# Patient Record
Sex: Male | Born: 1960 | Race: White | Hispanic: No | Marital: Married | Smoking: Former smoker
Health system: Southern US, Community
[De-identification: ages and names within clinical notes are randomized; demographics above are authoritative.]

## PROBLEM LIST (undated history)

## (undated) DIAGNOSIS — F32A Depression, unspecified: Secondary | ICD-10-CM

## (undated) DIAGNOSIS — Z789 Other specified health status: Secondary | ICD-10-CM

## (undated) DIAGNOSIS — C61 Malignant neoplasm of prostate: Secondary | ICD-10-CM

## (undated) DIAGNOSIS — R972 Elevated prostate specific antigen [PSA]: Secondary | ICD-10-CM

## (undated) DIAGNOSIS — S32409A Unspecified fracture of unspecified acetabulum, initial encounter for closed fracture: Secondary | ICD-10-CM

## (undated) DIAGNOSIS — I4891 Unspecified atrial fibrillation: Secondary | ICD-10-CM

## (undated) DIAGNOSIS — I499 Cardiac arrhythmia, unspecified: Secondary | ICD-10-CM

## (undated) HISTORY — DX: Unspecified fracture of unspecified acetabulum, initial encounter for closed fracture: S32.409A

## (undated) HISTORY — PX: OTHER SURGICAL HISTORY: SHX169

## (undated) HISTORY — PX: KNEE SURGERY: SHX244

---

## 1993-01-27 DIAGNOSIS — J189 Pneumonia, unspecified organism: Secondary | ICD-10-CM

## 1993-01-27 HISTORY — DX: Pneumonia, unspecified organism: J18.9

## 2008-06-12 ENCOUNTER — Encounter: Admission: RE | Admit: 2008-06-12 | Discharge: 2008-06-12 | Payer: Self-pay | Admitting: Family Medicine

## 2010-10-28 DEATH — deceased

## 2011-01-28 DIAGNOSIS — I4891 Unspecified atrial fibrillation: Secondary | ICD-10-CM

## 2011-01-28 HISTORY — DX: Unspecified atrial fibrillation: I48.91

## 2011-05-05 ENCOUNTER — Emergency Department (HOSPITAL_COMMUNITY): Payer: No Typology Code available for payment source

## 2011-05-05 ENCOUNTER — Inpatient Hospital Stay (HOSPITAL_COMMUNITY)
Admission: EM | Admit: 2011-05-05 | Discharge: 2011-05-20 | DRG: 958 | Disposition: A | Discharge status: Rehabilitation Facility | Payer: No Typology Code available for payment source | Attending: Orthopedic Surgery | Admitting: Orthopedic Surgery

## 2011-05-05 ENCOUNTER — Encounter (HOSPITAL_COMMUNITY)
Admission: EM | Disposition: A | Discharge status: Rehabilitation Facility | Payer: Self-pay | Source: Home / Self Care | Attending: Orthopedic Surgery

## 2011-05-05 ENCOUNTER — Encounter (HOSPITAL_COMMUNITY): Payer: Self-pay | Admitting: *Deleted

## 2011-05-05 ENCOUNTER — Encounter (HOSPITAL_COMMUNITY): Payer: Self-pay | Admitting: Anesthesiology

## 2011-05-05 DIAGNOSIS — N5089 Other specified disorders of the male genital organs: Secondary | ICD-10-CM | POA: Diagnosis not present

## 2011-05-05 DIAGNOSIS — S32409A Unspecified fracture of unspecified acetabulum, initial encounter for closed fracture: Principal | ICD-10-CM | POA: Diagnosis present

## 2011-05-05 DIAGNOSIS — J9819 Other pulmonary collapse: Secondary | ICD-10-CM | POA: Diagnosis present

## 2011-05-05 DIAGNOSIS — J9811 Atelectasis: Secondary | ICD-10-CM

## 2011-05-05 DIAGNOSIS — I959 Hypotension, unspecified: Secondary | ICD-10-CM | POA: Diagnosis present

## 2011-05-05 DIAGNOSIS — S32810A Multiple fractures of pelvis with stable disruption of pelvic ring, initial encounter for closed fracture: Secondary | ICD-10-CM | POA: Diagnosis present

## 2011-05-05 DIAGNOSIS — F17200 Nicotine dependence, unspecified, uncomplicated: Secondary | ICD-10-CM | POA: Diagnosis present

## 2011-05-05 DIAGNOSIS — F1010 Alcohol abuse, uncomplicated: Secondary | ICD-10-CM | POA: Diagnosis present

## 2011-05-05 DIAGNOSIS — S82409B Unspecified fracture of shaft of unspecified fibula, initial encounter for open fracture type I or II: Secondary | ICD-10-CM

## 2011-05-05 DIAGNOSIS — G5730 Lesion of lateral popliteal nerve, unspecified lower limb: Secondary | ICD-10-CM | POA: Diagnosis present

## 2011-05-05 DIAGNOSIS — E876 Hypokalemia: Secondary | ICD-10-CM | POA: Diagnosis not present

## 2011-05-05 DIAGNOSIS — D62 Acute posthemorrhagic anemia: Secondary | ICD-10-CM

## 2011-05-05 DIAGNOSIS — S32402A Unspecified fracture of left acetabulum, initial encounter for closed fracture: Secondary | ICD-10-CM

## 2011-05-05 DIAGNOSIS — IMO0002 Reserved for concepts with insufficient information to code with codable children: Secondary | ICD-10-CM | POA: Diagnosis present

## 2011-05-05 DIAGNOSIS — S329XXA Fracture of unspecified parts of lumbosacral spine and pelvis, initial encounter for closed fracture: Secondary | ICD-10-CM

## 2011-05-05 DIAGNOSIS — I4891 Unspecified atrial fibrillation: Secondary | ICD-10-CM | POA: Diagnosis not present

## 2011-05-05 DIAGNOSIS — S82202B Unspecified fracture of shaft of left tibia, initial encounter for open fracture type I or II: Secondary | ICD-10-CM

## 2011-05-05 DIAGNOSIS — S82209B Unspecified fracture of shaft of unspecified tibia, initial encounter for open fracture type I or II: Secondary | ICD-10-CM

## 2011-05-05 DIAGNOSIS — R5381 Other malaise: Secondary | ICD-10-CM | POA: Diagnosis present

## 2011-05-05 HISTORY — DX: Other specified health status: Z78.9

## 2011-05-05 HISTORY — PX: ORIF TIBIA FRACTURE: SHX5416

## 2011-05-05 HISTORY — DX: Unspecified fracture of unspecified acetabulum, initial encounter for closed fracture: S32.409A

## 2011-05-05 LAB — COMPREHENSIVE METABOLIC PANEL
ALT: 67 U/L — ABNORMAL HIGH (ref 0–53)
AST: 59 U/L — ABNORMAL HIGH (ref 0–37)
Calcium: 8.2 mg/dL — ABNORMAL LOW (ref 8.4–10.5)
Creatinine, Ser: 1.12 mg/dL (ref 0.50–1.35)
GFR calc Af Amer: 87 mL/min — ABNORMAL LOW (ref >90)
Sodium: 139 mEq/L (ref 135–145)
Total Protein: 6.5 g/dL (ref 6.0–8.3)

## 2011-05-05 LAB — POCT I-STAT, CHEM 8
BUN: 8 mg/dL (ref 6–23)
Chloride: 107 mEq/L (ref 96–112)
Creatinine, Ser: 1.4 mg/dL — ABNORMAL HIGH (ref 0.50–1.35)
Glucose, Bld: 146 mg/dL — ABNORMAL HIGH (ref 70–99)
Potassium: 3.7 mEq/L (ref 3.5–5.1)
Sodium: 143 mEq/L (ref 135–145)

## 2011-05-05 LAB — URINALYSIS, MICROSCOPIC ONLY
Glucose, UA: NEGATIVE mg/dL
Leukocytes, UA: NEGATIVE
Protein, ur: 100 mg/dL — AB
pH: 6.5 (ref 5.0–8.0)

## 2011-05-05 LAB — CBC
HCT: 42.5 % (ref 39.0–52.0)
Hemoglobin: 14.8 g/dL (ref 13.0–17.0)
MCH: 31.9 pg (ref 26.0–34.0)
MCHC: 34.8 g/dL (ref 30.0–36.0)
RBC: 4.64 MIL/uL (ref 4.22–5.81)

## 2011-05-05 LAB — PROTIME-INR: INR: 1.18 (ref 0.00–1.49)

## 2011-05-05 LAB — CDS SEROLOGY

## 2011-05-05 SURGERY — OPEN REDUCTION INTERNAL FIXATION (ORIF) TIBIA FRACTURE
Anesthesia: General | Site: Leg Lower | Wound class: Dirty or Infected

## 2011-05-05 MED ORDER — TETANUS-DIPHTH-ACELL PERTUSSIS 5-2.5-18.5 LF-MCG/0.5 IM SUSP
0.5000 mL | Freq: Once | INTRAMUSCULAR | Status: AC
  Administered 2011-05-05: 0.5 mL via INTRAMUSCULAR
  Filled 2011-05-05: qty 0.5

## 2011-05-05 MED ORDER — CEFAZOLIN SODIUM 1-5 GM-% IV SOLN: 1.0000 g | Freq: Three times a day (TID) | INTRAVENOUS | Status: AC

## 2011-05-05 MED ORDER — SODIUM CHLORIDE 0.9 % IV BOLUS (SEPSIS)
1000.0000 mL | Freq: Once | INTRAVENOUS | Status: AC
  Administered 2011-05-05: 1000 mL via INTRAVENOUS

## 2011-05-05 MED ORDER — CEFAZOLIN SODIUM 1-5 GM-% IV SOLN
1.0000 g | Freq: Once | INTRAVENOUS | Status: AC
  Administered 2011-05-05 – 2011-05-06 (×2): 1 g via INTRAVENOUS
  Filled 2011-05-05: qty 50

## 2011-05-05 MED ORDER — IOHEXOL 300 MG/ML  SOLN
100.0000 mL | Freq: Once | INTRAMUSCULAR | Status: AC | PRN
  Administered 2011-05-05: 100 mL via INTRAVENOUS

## 2011-05-05 MED ORDER — HYDROMORPHONE HCL PF 1 MG/ML IJ SOLN
INTRAMUSCULAR | Status: AC
  Filled 2011-05-05: qty 1

## 2011-05-05 MED ORDER — LACTATED RINGERS IV SOLN
INTRAVENOUS | Status: DC | PRN
  Administered 2011-05-05 – 2011-05-06 (×2): via INTRAVENOUS

## 2011-05-05 MED ORDER — ONDANSETRON HCL 4 MG/2ML IJ SOLN
INTRAMUSCULAR | Status: AC
  Filled 2011-05-05: qty 2

## 2011-05-05 MED ORDER — HYDROMORPHONE HCL PF 1 MG/ML IJ SOLN
1.0000 mg | Freq: Once | INTRAMUSCULAR | Status: AC
  Administered 2011-05-05: 1 mg via INTRAVENOUS
  Filled 2011-05-05: qty 1

## 2011-05-05 MED ORDER — MORPHINE SULFATE 4 MG/ML IJ SOLN
4.0000 mg | INTRAMUSCULAR | Status: DC | PRN
  Administered 2011-05-06 (×4): 4 mg via INTRAVENOUS
  Filled 2011-05-05 (×4): qty 1

## 2011-05-05 MED ORDER — MORPHINE SULFATE 4 MG/ML IJ SOLN
INTRAMUSCULAR | Status: AC
  Administered 2011-05-05: 4 mg via INTRAVENOUS
  Filled 2011-05-05: qty 1

## 2011-05-05 MED ORDER — HYDROMORPHONE HCL PF 1 MG/ML IJ SOLN
1.0000 mg | Freq: Once | INTRAMUSCULAR | Status: AC
  Administered 2011-05-05: 1 mg via INTRAVENOUS

## 2011-05-05 SURGICAL SUPPLY — 59 items
BANDAGE ELASTIC 6 VELCRO ST LF (GAUZE/BANDAGES/DRESSINGS) ×3 IMPLANT
BIT DRILL 2.5X2.75 QC CALB (BIT) ×2 IMPLANT
CLEANER TIP ELECTROSURG 2X2 (MISCELLANEOUS) ×3 IMPLANT
CLOTH BEACON ORANGE TIMEOUT ST (SAFETY) ×3 IMPLANT
COTTON STERILE ROLL (GAUZE/BANDAGES/DRESSINGS) ×3 IMPLANT
CUFF TOURNIQUET SINGLE 34IN LL (TOURNIQUET CUFF) IMPLANT
CUFF TOURNIQUET SINGLE 44IN (TOURNIQUET CUFF) IMPLANT
DRAPE C-ARM 42X72 X-RAY (DRAPES) ×3 IMPLANT
DRAPE INCISE IOBAN 66X45 STRL (DRAPES) ×1 IMPLANT
DRAPE PROXIMA HALF (DRAPES) ×7 IMPLANT
DRAPE U-SHAPE 47X51 STRL (DRAPES) ×1 IMPLANT
DRSG ADAPTIC 3X8 NADH LF (GAUZE/BANDAGES/DRESSINGS) ×3 IMPLANT
DRSG PAD ABDOMINAL 8X10 ST (GAUZE/BANDAGES/DRESSINGS) ×3 IMPLANT
ELECT REM PT RETURN 9FT ADLT (ELECTROSURGICAL) ×3
ELECTRODE REM PT RTRN 9FT ADLT (ELECTROSURGICAL) ×2 IMPLANT
GLOVE BIO SURGEON STRL SZ7.5 (GLOVE) ×3 IMPLANT
GLOVE BIOGEL PI IND STRL 7.5 (GLOVE) ×2 IMPLANT
GLOVE BIOGEL PI INDICATOR 7.5 (GLOVE) ×1
GLOVE SKINSENSE NS SZ8.0 LF (GLOVE) ×1
GLOVE SKINSENSE STRL SZ8.0 LF (GLOVE) ×2 IMPLANT
GLOVE SURG SS PI 8.0 STRL IVOR (GLOVE) ×3 IMPLANT
GOWN STRL NON-REIN LRG LVL3 (GOWN DISPOSABLE) ×9 IMPLANT
GUIDEWIRE BALL NOSE 80CM (WIRE) ×2 IMPLANT
KIT BASIN OR (CUSTOM PROCEDURE TRAY) ×3 IMPLANT
KIT ROOM TURNOVER OR (KITS) ×3 IMPLANT
MANIFOLD NEPTUNE II (INSTRUMENTS) ×3 IMPLANT
NS IRRIG 1000ML POUR BTL (IV SOLUTION) ×3 IMPLANT
PACK ORTHO EXTREMITY (CUSTOM PROCEDURE TRAY) ×3 IMPLANT
PAD ARMBOARD 7.5X6 YLW CONV (MISCELLANEOUS) ×6 IMPLANT
PAD CAST 4YDX4 CTTN HI CHSV (CAST SUPPLIES) ×2 IMPLANT
PADDING CAST COTTON 4X4 STRL (CAST SUPPLIES) ×3
PADDING CAST COTTON 6X4 STRL (CAST SUPPLIES) ×2 IMPLANT
PLATE LOCK COMP 9H 3.5 FOOT (Plate) ×2 IMPLANT
SCREW CORT FT 32X3.5XNONLOCK (Screw) ×3 IMPLANT
SCREW CORTICAL 3.5MM  28MM (Screw) ×1 IMPLANT
SCREW CORTICAL 3.5MM  30MM (Screw) ×2 IMPLANT
SCREW CORTICAL 3.5MM  32MM (Screw) ×3 IMPLANT
SCREW CORTICAL 3.5MM  34MM (Screw) ×1 IMPLANT
SCREW CORTICAL 3.5MM 26MM (Screw) ×2 IMPLANT
SCREW CORTICAL 3.5MM 28MM (Screw) ×1 IMPLANT
SCREW CORTICAL 3.5MM 30MM (Screw) ×2 IMPLANT
SCREW CORTICAL 3.5MM 32MM (Screw) ×6 IMPLANT
SCREW CORTICAL 3.5MM 34MM (Screw) ×1 IMPLANT
SPONGE GAUZE 4X4 12PLY (GAUZE/BANDAGES/DRESSINGS) ×3 IMPLANT
SPONGE LAP 18X18 X RAY DECT (DISPOSABLE) ×5 IMPLANT
SPONGE LAP 4X18 X RAY DECT (DISPOSABLE) ×1 IMPLANT
STAPLER VISISTAT 35W (STAPLE) ×3 IMPLANT
STOCKINETTE IMPERVIOUS 9X36 MD (GAUZE/BANDAGES/DRESSINGS) ×3 IMPLANT
SUCTION FRAZIER TIP 10 FR DISP (SUCTIONS) ×1 IMPLANT
SUT ETHILON 3 0 PS 1 (SUTURE) ×4 IMPLANT
SUT VIC AB 2-0 CTB1 (SUTURE) ×2 IMPLANT
SUT VIC AB 2-0 FS1 27 (SUTURE) ×2 IMPLANT
SUT VIC AB 3-0 FS2 27 (SUTURE) ×4 IMPLANT
TOWEL OR 17X24 6PK STRL BLUE (TOWEL DISPOSABLE) ×3 IMPLANT
TOWEL OR 17X26 10 PK STRL BLUE (TOWEL DISPOSABLE) ×3 IMPLANT
TUBE CONNECTING 12X1/4 (SUCTIONS) ×3 IMPLANT
UNDERPAD 30X30 INCONTINENT (UNDERPADS AND DIAPERS) ×3 IMPLANT
WATER STERILE IRR 1000ML POUR (IV SOLUTION) ×3 IMPLANT
YANKAUER SUCT BULB TIP NO VENT (SUCTIONS) ×3 IMPLANT

## 2011-05-05 NOTE — ED Notes (Addendum)
xrays complete
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
## 2011-05-05 NOTE — ED Notes (Addendum)
Upgrade to level one per EDP

## 2011-05-05 NOTE — ED Notes (Signed)
Pt returned to exam room. 
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
## 2011-05-05 NOTE — Progress Notes (Signed)
Orthopedic Tech Progress Note Patient Details:  [name] 09-13-1960 [id number]  Other Ortho Devices Type of Ortho Device: [place] cervical collar Ortho Device Location: neck Ortho Device Interventions: Application   Kimi Bordeau 05/05/2011, 10:01 PM

## 2011-05-05 NOTE — ED Notes (Signed)
Dr Criss Alvine at bedside. Aware of BP liter #3 and #4 up per verbal order on pressure bags

## 2011-05-05 NOTE — ED Notes (Signed)
Pt refused to have wedding band cut off at this time

## 2011-05-05 NOTE — ED Notes (Signed)
Rectal exam by EDP. Good rectal tone no gross blood

## 2011-05-05 NOTE — ED Notes (Signed)
Pt cleared from back board. 

## 2011-05-05 NOTE — ED Notes (Signed)
Pt to CT with RN

## 2011-05-05 NOTE — ED Notes (Signed)
Family updated as to patient's status.

## 2011-05-05 NOTE — ED Notes (Signed)
Pt in CT scan, RN and EMT with pt
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
## 2011-05-05 NOTE — ED Notes (Signed)
Dr Biagio Quint trauma MD at bedsie
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
## 2011-05-05 NOTE — Preoperative (Signed)
Beta Blockers   Reason not to administer Beta Blockers:Not Applicable 

## 2011-05-05 NOTE — Anesthesia Preprocedure Evaluation (Addendum)
Anesthesia Evaluation  Patient identified by MRN, date of birth, ID band Patient awake    Reviewed: Allergy & Precautions, H&P , NPO status , Patient's Chart, lab work & pertinent test results  Airway Mallampati: II TM Distance: >3 FB Neck ROM: Full    Dental   Pulmonary Current Smoker,    Pulmonary exam normal       Cardiovascular     Neuro/Psych    GI/Hepatic   Endo/Other    Renal/GU      Musculoskeletal   Abdominal (+) + obese,   Peds  Hematology   Anesthesia Other Findings   Reproductive/Obstetrics                          Anesthesia Physical Anesthesia Plan  ASA: II and Emergent  Anesthesia Plan: General   Post-op Pain Management:    Induction: Intravenous, Rapid sequence and Cricoid pressure planned  Airway Management Planned: Oral ETT  Additional Equipment:   Intra-op Plan:   Post-operative Plan: Extubation in OR  Informed Consent: I have reviewed the patients History and Physical, chart, labs and discussed the procedure including the risks, benefits and alternatives for the proposed anesthesia with the patient or authorized representative who has indicated his/her understanding and acceptance.     Plan Discussed with: CRNA and Surgeon  Anesthesia Plan Comments:         Anesthesia Quick Evaluation
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
## 2011-05-06 ENCOUNTER — Inpatient Hospital Stay (HOSPITAL_COMMUNITY): Payer: No Typology Code available for payment source

## 2011-05-06 ENCOUNTER — Encounter (HOSPITAL_COMMUNITY): Payer: Self-pay | Admitting: Anesthesiology

## 2011-05-06 ENCOUNTER — Emergency Department (HOSPITAL_COMMUNITY): Payer: No Typology Code available for payment source

## 2011-05-06 ENCOUNTER — Emergency Department (HOSPITAL_COMMUNITY): Payer: No Typology Code available for payment source | Admitting: Anesthesiology

## 2011-05-06 ENCOUNTER — Encounter (HOSPITAL_COMMUNITY): Payer: Self-pay | Admitting: *Deleted

## 2011-05-06 DIAGNOSIS — S32810A Multiple fractures of pelvis with stable disruption of pelvic ring, initial encounter for closed fracture: Secondary | ICD-10-CM | POA: Diagnosis present

## 2011-05-06 DIAGNOSIS — D62 Acute posthemorrhagic anemia: Secondary | ICD-10-CM

## 2011-05-06 DIAGNOSIS — S82209B Unspecified fracture of shaft of unspecified tibia, initial encounter for open fracture type I or II: Secondary | ICD-10-CM | POA: Diagnosis present

## 2011-05-06 LAB — POCT I-STAT 4, (NA,K, GLUC, HGB,HCT)
Potassium: 4.3 mEq/L (ref 3.5–5.1)
Sodium: 143 mEq/L (ref 135–145)

## 2011-05-06 MED ORDER — LIDOCAINE HCL (CARDIAC) 20 MG/ML IV SOLN
INTRAVENOUS | Status: DC | PRN
  Administered 2011-05-06: 100 mg via INTRAVENOUS

## 2011-05-06 MED ORDER — ONDANSETRON HCL 4 MG PO TABS: 4.0000 mg | ORAL_TABLET | Freq: Four times a day (QID) | ORAL | Status: DC | PRN

## 2011-05-06 MED ORDER — OXYCODONE-ACETAMINOPHEN 5-325 MG PO TABS: 1.0000 | ORAL_TABLET | ORAL | Status: DC | PRN

## 2011-05-06 MED ORDER — MORPHINE SULFATE (PF) 1 MG/ML IV SOLN
INTRAVENOUS | Status: AC
  Filled 2011-05-06: qty 25

## 2011-05-06 MED ORDER — ONDANSETRON HCL 4 MG/2ML IJ SOLN
INTRAMUSCULAR | Status: DC | PRN
  Administered 2011-05-06: 4 mg via INTRAVENOUS

## 2011-05-06 MED ORDER — DEXTROSE 5 % IV SOLN
500.0000 mg | Freq: Four times a day (QID) | INTRAVENOUS | Status: DC
  Administered 2011-05-08 – 2011-05-12 (×3): 500 mg via INTRAVENOUS
  Filled 2011-05-06 (×38): qty 5

## 2011-05-06 MED ORDER — HYDROMORPHONE HCL PF 1 MG/ML IJ SOLN
0.2500 mg | INTRAMUSCULAR | Status: DC | PRN
  Administered 2011-05-06 (×4): 0.5 mg via INTRAVENOUS

## 2011-05-06 MED ORDER — MORPHINE SULFATE 4 MG/ML IJ SOLN: 3.0000 mg | INTRAMUSCULAR | Status: DC | PRN

## 2011-05-06 MED ORDER — ROCURONIUM BROMIDE 100 MG/10ML IV SOLN
INTRAVENOUS | Status: DC | PRN
  Administered 2011-05-06: 30 mg via INTRAVENOUS
  Administered 2011-05-06: 20 mg via INTRAVENOUS

## 2011-05-06 MED ORDER — METOCLOPRAMIDE HCL 5 MG/ML IJ SOLN
5.0000 mg | Freq: Three times a day (TID) | INTRAMUSCULAR | Status: DC | PRN
  Filled 2011-05-06: qty 2

## 2011-05-06 MED ORDER — DIPHENHYDRAMINE HCL 50 MG/ML IJ SOLN: 12.5000 mg | Freq: Four times a day (QID) | INTRAMUSCULAR | Status: DC | PRN

## 2011-05-06 MED ORDER — SUCCINYLCHOLINE CHLORIDE 20 MG/ML IJ SOLN
INTRAMUSCULAR | Status: DC | PRN
  Administered 2011-05-06: 140 mg via INTRAVENOUS

## 2011-05-06 MED ORDER — DIPHENHYDRAMINE HCL 25 MG PO CAPS
25.0000 mg | ORAL_CAPSULE | Freq: Once | ORAL | Status: AC
  Administered 2011-05-06: 25 mg via ORAL
  Filled 2011-05-06: qty 1

## 2011-05-06 MED ORDER — ACETAMINOPHEN 10 MG/ML IV SOLN
1000.0000 mg | Freq: Four times a day (QID) | INTRAVENOUS | Status: AC
  Administered 2011-05-06 – 2011-05-07 (×4): 1000 mg via INTRAVENOUS
  Filled 2011-05-06 (×4): qty 100

## 2011-05-06 MED ORDER — GENTAMICIN SULFATE 40 MG/ML IJ SOLN
480.0000 mg | Freq: Once | INTRAVENOUS | Status: AC
  Administered 2011-05-06: 480 mg via INTRAVENOUS
  Filled 2011-05-06: qty 12

## 2011-05-06 MED ORDER — METHOCARBAMOL 500 MG PO TABS
500.0000 mg | ORAL_TABLET | Freq: Four times a day (QID) | ORAL | Status: DC | PRN
  Administered 2011-05-06 (×2): 500 mg via ORAL
  Filled 2011-05-06 (×2): qty 1

## 2011-05-06 MED ORDER — PHENYLEPHRINE HCL 10 MG/ML IJ SOLN
10.0000 mg | INTRAVENOUS | Status: DC | PRN
  Administered 2011-05-06: 5 ug/min via INTRAVENOUS

## 2011-05-06 MED ORDER — ENOXAPARIN SODIUM 30 MG/0.3ML ~~LOC~~ SOLN
30.0000 mg | Freq: Two times a day (BID) | SUBCUTANEOUS | Status: DC
  Administered 2011-05-06 – 2011-05-20 (×23): 30 mg via SUBCUTANEOUS
  Filled 2011-05-06 (×32): qty 0.3

## 2011-05-06 MED ORDER — DIPHENHYDRAMINE HCL 12.5 MG/5ML PO ELIX: 12.5000 mg | ORAL_SOLUTION | Freq: Four times a day (QID) | ORAL | Status: DC | PRN

## 2011-05-06 MED ORDER — SODIUM CHLORIDE 0.9 % IJ SOLN: 9.0000 mL | INTRAMUSCULAR | Status: DC | PRN

## 2011-05-06 MED ORDER — KCL IN DEXTROSE-NACL 20-5-0.9 MEQ/L-%-% IV SOLN
INTRAVENOUS | Status: DC
  Administered 2011-05-06: 1000 mL via INTRAVENOUS
  Administered 2011-05-08 – 2011-05-09 (×2): via INTRAVENOUS
  Filled 2011-05-06 (×12): qty 1000

## 2011-05-06 MED ORDER — FENTANYL CITRATE 0.05 MG/ML IJ SOLN
INTRAMUSCULAR | Status: DC | PRN
  Administered 2011-05-06 (×3): 50 ug via INTRAVENOUS

## 2011-05-06 MED ORDER — HYDROMORPHONE HCL PF 1 MG/ML IJ SOLN
INTRAMUSCULAR | Status: AC
  Filled 2011-05-06: qty 1

## 2011-05-06 MED ORDER — GENTAMICIN SULFATE 40 MG/ML IJ SOLN
480.0000 mg | INTRAVENOUS | Status: DC
  Filled 2011-05-06 (×2): qty 12

## 2011-05-06 MED ORDER — SODIUM CHLORIDE 0.9 % IR SOLN
Status: DC | PRN
  Administered 2011-05-06: 6000 mL

## 2011-05-06 MED ORDER — ALBUMIN HUMAN 5 % IV SOLN
INTRAVENOUS | Status: DC | PRN
  Administered 2011-05-06: 01:00:00 via INTRAVENOUS

## 2011-05-06 MED ORDER — METOCLOPRAMIDE HCL 10 MG PO TABS
5.0000 mg | ORAL_TABLET | Freq: Three times a day (TID) | ORAL | Status: DC | PRN
  Filled 2011-05-06: qty 1

## 2011-05-06 MED ORDER — LORAZEPAM 2 MG/ML IJ SOLN: 1.0000 mg | Freq: Once | INTRAMUSCULAR | Status: DC | PRN

## 2011-05-06 MED ORDER — CEFAZOLIN SODIUM 1-5 GM-% IV SOLN: 1.0000 g | Freq: Three times a day (TID) | INTRAVENOUS | Status: DC

## 2011-05-06 MED ORDER — ONDANSETRON HCL 4 MG/2ML IJ SOLN: 4.0000 mg | Freq: Four times a day (QID) | INTRAMUSCULAR | Status: DC | PRN

## 2011-05-06 MED ORDER — LACTATED RINGERS IV SOLN: INTRAVENOUS | Status: DC

## 2011-05-06 MED ORDER — VANCOMYCIN HCL 1000 MG IV SOLR
1500.0000 mg | Freq: Once | INTRAVENOUS | Status: AC
  Administered 2011-05-06: 1500 mg via INTRAVENOUS
  Filled 2011-05-06: qty 1500

## 2011-05-06 MED ORDER — FUROSEMIDE 10 MG/ML IJ SOLN
20.0000 mg | Freq: Once | INTRAMUSCULAR | Status: AC
  Administered 2011-05-06: 20 mg via INTRAVENOUS
  Filled 2011-05-06: qty 2

## 2011-05-06 MED ORDER — VANCOMYCIN HCL 1000 MG IV SOLR
1250.0000 mg | Freq: Two times a day (BID) | INTRAVENOUS | Status: AC
  Administered 2011-05-06 – 2011-05-08 (×4): 1250 mg via INTRAVENOUS
  Filled 2011-05-06 (×5): qty 1250

## 2011-05-06 MED ORDER — 0.9 % SODIUM CHLORIDE (POUR BTL) OPTIME
TOPICAL | Status: DC | PRN
  Administered 2011-05-06: 1000 mL

## 2011-05-06 MED ORDER — SODIUM CHLORIDE 0.9 % IV SOLN
INTRAVENOUS | Status: DC
  Administered 2011-05-06: 75 mL/h via INTRAVENOUS

## 2011-05-06 MED ORDER — MIDAZOLAM HCL 5 MG/5ML IJ SOLN
INTRAMUSCULAR | Status: DC | PRN
  Administered 2011-05-06: 2 mg via INTRAVENOUS

## 2011-05-06 MED ORDER — PROPOFOL 10 MG/ML IV EMUL
INTRAVENOUS | Status: DC | PRN
  Administered 2011-05-06: 200 mg via INTRAVENOUS

## 2011-05-06 MED ORDER — FUROSEMIDE 10 MG/ML IJ SOLN
20.0000 mg | Freq: Once | INTRAMUSCULAR | Status: AC
  Administered 2011-05-06: 20 mg via INTRAVENOUS

## 2011-05-06 MED ORDER — PHENYLEPHRINE HCL 10 MG/ML IJ SOLN
INTRAMUSCULAR | Status: DC | PRN
  Administered 2011-05-06 (×8): 80 ug via INTRAVENOUS

## 2011-05-06 MED ORDER — METHOCARBAMOL 100 MG/ML IJ SOLN: 500.0000 mg | Freq: Four times a day (QID) | INTRAVENOUS | Status: DC | PRN

## 2011-05-06 MED ORDER — MORPHINE SULFATE (PF) 1 MG/ML IV SOLN
INTRAVENOUS | Status: AC
  Administered 2011-05-06: 12:00:00
  Filled 2011-05-06: qty 25

## 2011-05-06 MED ORDER — NALOXONE HCL 0.4 MG/ML IJ SOLN: 0.4000 mg | INTRAMUSCULAR | Status: DC | PRN

## 2011-05-06 MED ORDER — HETASTARCH-ELECTROLYTES 6 % IV SOLN
INTRAVENOUS | Status: DC | PRN
  Administered 2011-05-06 (×2): via INTRAVENOUS

## 2011-05-06 MED ORDER — ONDANSETRON HCL 4 MG/2ML IJ SOLN
4.0000 mg | Freq: Four times a day (QID) | INTRAMUSCULAR | Status: DC | PRN
  Administered 2011-05-06: 4 mg via INTRAVENOUS
  Filled 2011-05-06: qty 2

## 2011-05-06 MED ORDER — PANTOPRAZOLE SODIUM 40 MG IV SOLR
40.0000 mg | Freq: Every day | INTRAVENOUS | Status: DC
  Administered 2011-05-06: 40 mg via INTRAVENOUS
  Filled 2011-05-06 (×2): qty 40

## 2011-05-06 MED ORDER — METHOCARBAMOL 500 MG PO TABS
500.0000 mg | ORAL_TABLET | Freq: Four times a day (QID) | ORAL | Status: DC
  Administered 2011-05-06 – 2011-05-15 (×26): 500 mg via ORAL
  Filled 2011-05-06 (×42): qty 1

## 2011-05-06 MED ORDER — MORPHINE SULFATE (PF) 1 MG/ML IV SOLN
INTRAVENOUS | Status: DC
  Administered 2011-05-06: 25 mg via INTRAVENOUS
  Administered 2011-05-06: 30 mg via INTRAVENOUS
  Administered 2011-05-07: 7.2 mg via INTRAVENOUS
  Administered 2011-05-07: 3 mg via INTRAVENOUS
  Administered 2011-05-07: 7.5 mg via INTRAVENOUS
  Administered 2011-05-07: 10.5 mg via INTRAVENOUS
  Administered 2011-05-07: 6 mg via INTRAVENOUS
  Administered 2011-05-07: 21:00:00 via INTRAVENOUS
  Administered 2011-05-07: 1.6 mg via INTRAVENOUS
  Administered 2011-05-08: 06:00:00 via INTRAVENOUS
  Administered 2011-05-08: 9 mg via INTRAVENOUS
  Administered 2011-05-08 (×2): via INTRAVENOUS
  Administered 2011-05-08: 9.95 mg via INTRAVENOUS
  Administered 2011-05-08: 27 mg via INTRAVENOUS
  Administered 2011-05-09: 7.5 mg via INTRAVENOUS
  Administered 2011-05-09: 18 mg via INTRAVENOUS
  Administered 2011-05-09: 04:00:00 via INTRAVENOUS
  Administered 2011-05-09: 12 mg via INTRAVENOUS
  Administered 2011-05-09: 17:00:00 via INTRAVENOUS
  Administered 2011-05-09: 4.5 mg via INTRAVENOUS
  Administered 2011-05-10: 25.5 mg via INTRAVENOUS

## 2011-05-06 NOTE — Transfer of Care (Signed)
Immediate Anesthesia Transfer of Care Note  Patient: [name]  Procedure(s) Performed: Procedure(s) (LRB): OPEN REDUCTION INTERNAL FIXATION (ORIF) TIBIA FRACTURE ()  Patient Location: PACU  Anesthesia Type: General  Level of Consciousness: awake and alert   Airway & Oxygen Therapy: Patient Spontanous Breathing and Patient connected to nasal cannula oxygen  Post-op Assessment: Report given to PACU RN, Post -op Vital signs reviewed and stable and Patient moving all extremities X 4  Post vital signs: Reviewed and stable  Complications: No apparent anesthesia complications
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
## 2011-05-06 NOTE — Consult Note (Signed)
[facility]  Dictation [phone]  Pt seen and evaluated  Plan for OR Thursday for ORIF L acetabulum Will arrange for XRT for HO prophylaxis hopefully Friday Continue bed rest for now Overhead frame with trapeze SCD's and Lovenox for DVT prophylaxis, will hold tomorrow pm dose and Thursday am dose. Plan for lovenox bridge to coumadin after surgery Pt will be NWB/TDWB for transfers after surgery ABL anemia- transfuse 2 units now as we anticipate surgery on Thursday. Need to physiologically optimize pt. Recheck lactic acid in am Ice prn L leg and hip Monitor for development of ileus, decreased BS at current time.  Would possibly expect this to be exacerbated with continued narcotics, immobility and upcoming surgery. ? If adding scheduled reglan would be of benefit at this time. Will discuss with TS Continue with scheduled abx for open fx prophylaxis Genital numbness- likely from injury and swelling, will need to monitor and re-eval once pt has voiding trial.   Mearl Latin, PA-C [facility] [phone] (P) 3:26 PM 05/06/2011

## 2011-05-06 NOTE — Progress Notes (Signed)
Orthopedic Tech Progress Note Patient Details:  CZAR YSAGUIRRE 1960/08/03 [id number]  Patient ID: DUANE EARNSHAW, male   DOB: [birth year], 51 y.o.   MRN: [id number] Applied OHF to pt bed.  Leverne Amrhein T 05/06/2011, 5:10 PM

## 2011-05-06 NOTE — Anesthesia Postprocedure Evaluation (Signed)
  Anesthesia Post-op Note  Patient: [name]  Procedure(s) Performed: Procedure(s) (LRB): OPEN REDUCTION INTERNAL FIXATION (ORIF) TIBIA FRACTURE ()  Patient Location: PACU  Anesthesia Type: General  Level of Consciousness: awake  Airway and Oxygen Therapy: Patient Spontanous Breathing  Post-op Pain: mild  Post-op Assessment: Post-op Vital signs reviewed, Patient's Cardiovascular Status Stable, Respiratory Function Stable, Patent Airway, No signs of Nausea or vomiting and Pain level controlled  Post-op Vital Signs: stable  Complications: No apparent anesthesia complications
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
## 2011-05-06 NOTE — Progress Notes (Signed)
UR of chart complete.  

## 2011-05-06 NOTE — Progress Notes (Signed)
Patient ID: TOD ABRAHAMSEN, male   DOB: Jan 20, 1961, 51 y.o.   MRN: [id number] Preop Dx:  1.   Open Left Tib/Fib fx,    2.  Left 10 cm open wound lateral knee   3.  Left 3 cm open wound Foot   4. Comminuted left Acetabulum Fx Postop Dx: Same Operation: 1.  ORIF Open Tib/Fib Fx   2. Stress Xrays Ankle   3.  Distal Femoral Traction Pin Placement   4.  Primary Closure left 10 cm open wound   5.  Primary Closure left open 3 cm foot wound   6. I and D Open left Tib/Fib Fx to Bone Surgeon:  Sherri Rad, M.D. Assistant;  Rexene Edison, PAC EBL 300 cc CX:  None Implant:  7 hole 3.5 mm DC Plate ALPS Stable to PAR Dictation #:  [id number]
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
## 2011-05-06 NOTE — Anesthesia Procedure Notes (Signed)
Procedure Name: Intubation Date/Time: 05/06/2011 12:18 AM Performed by: Rossie Muskrat L Pre-anesthesia Checklist: Patient identified, Patient being monitored, Timeout performed, Emergency Drugs available and Suction available Patient Re-evaluated:Patient Re-evaluated prior to inductionOxygen Delivery Method: Circle system utilized Preoxygenation: Pre-oxygenation with 100% oxygen Intubation Type: IV induction Ventilation: Mask ventilation without difficulty Laryngoscope Size: Miller and 2 Grade View: Grade I Nasal Tubes: Right Tube size: 7.5 mm Number of attempts: 1 Airway Equipment and Method: Stylet Placement Confirmation: ETT inserted through vocal cords under direct vision,  positive ETCO2 and breath sounds checked- equal and bilateral Secured at: 22 cm Tube secured with: Tape Dental Injury: Teeth and Oropharynx as per pre-operative assessment

## 2011-05-07 ENCOUNTER — Encounter: Payer: Self-pay | Admitting: Radiation Oncology

## 2011-05-07 DIAGNOSIS — F17200 Nicotine dependence, unspecified, uncomplicated: Secondary | ICD-10-CM

## 2011-05-07 DIAGNOSIS — F1010 Alcohol abuse, uncomplicated: Secondary | ICD-10-CM

## 2011-05-07 DIAGNOSIS — G5730 Lesion of lateral popliteal nerve, unspecified lower limb: Secondary | ICD-10-CM

## 2011-05-07 DIAGNOSIS — S32402A Unspecified fracture of left acetabulum, initial encounter for closed fracture: Secondary | ICD-10-CM

## 2011-05-07 LAB — COMPREHENSIVE METABOLIC PANEL
Albumin: 2.2 g/dL — ABNORMAL LOW (ref 3.5–5.2)
BUN: 8 mg/dL (ref 6–23)
CO2: 26 mEq/L (ref 19–32)
Chloride: 101 mEq/L (ref 96–112)
Creatinine, Ser: 0.88 mg/dL (ref 0.50–1.35)
GFR calc non Af Amer: >90 mL/min (ref >90)
Total Bilirubin: 0.9 mg/dL (ref 0.3–1.2)

## 2011-05-07 LAB — CBC
HCT: 23.6 % — ABNORMAL LOW (ref 39.0–52.0)
MCH: 30.8 pg (ref 26.0–34.0)
MCV: 89.7 fL (ref 78.0–100.0)
RDW: 13.2 % (ref 11.5–15.5)
WBC: 6.5 10*3/uL (ref 4.0–10.5)

## 2011-05-07 LAB — PREPARE RBC (CROSSMATCH)

## 2011-05-07 MED ORDER — PANTOPRAZOLE SODIUM 40 MG PO TBEC
40.0000 mg | DELAYED_RELEASE_TABLET | Freq: Every day | ORAL | Status: DC
  Administered 2011-05-07 – 2011-05-20 (×9): 40 mg via ORAL
  Filled 2011-05-07 (×10): qty 1

## 2011-05-07 MED ORDER — MORPHINE SULFATE (PF) 1 MG/ML IV SOLN
INTRAVENOUS | Status: AC
  Administered 2011-05-07: 04:00:00
  Filled 2011-05-07: qty 25

## 2011-05-07 MED ORDER — MORPHINE SULFATE (PF) 1 MG/ML IV SOLN
INTRAVENOUS | Status: AC
  Administered 2011-05-07: 21:00:00
  Filled 2011-05-07: qty 25

## 2011-05-07 MED ORDER — FUROSEMIDE 10 MG/ML IJ SOLN
20.0000 mg | Freq: Once | INTRAMUSCULAR | Status: AC
  Administered 2011-05-07: 20 mg via INTRAVENOUS
  Filled 2011-05-07: qty 2

## 2011-05-07 MED ORDER — ONDANSETRON HCL 4 MG/2ML IJ SOLN: 4.0000 mg | Freq: Four times a day (QID) | INTRAMUSCULAR | Status: DC | PRN

## 2011-05-07 MED ORDER — DIPHENHYDRAMINE HCL 12.5 MG/5ML PO ELIX: 12.5000 mg | ORAL_SOLUTION | Freq: Four times a day (QID) | ORAL | Status: DC | PRN

## 2011-05-07 MED ORDER — NALOXONE HCL 0.4 MG/ML IJ SOLN: 0.4000 mg | INTRAMUSCULAR | Status: DC | PRN

## 2011-05-07 MED ORDER — CEFAZOLIN SODIUM-DEXTROSE 2-3 GM-% IV SOLR
2.0000 g | Freq: Once | INTRAVENOUS | Status: AC
  Administered 2011-05-12: 2 g via INTRAVENOUS
  Filled 2011-05-07: qty 50

## 2011-05-07 MED ORDER — DIPHENHYDRAMINE HCL 25 MG PO CAPS
25.0000 mg | ORAL_CAPSULE | Freq: Once | ORAL | Status: AC
  Administered 2011-05-07: 25 mg via ORAL
  Filled 2011-05-07: qty 1

## 2011-05-07 MED ORDER — SODIUM CHLORIDE 0.9 % IJ SOLN: 9.0000 mL | INTRAMUSCULAR | Status: DC | PRN

## 2011-05-07 MED ORDER — DIPHENHYDRAMINE HCL 50 MG/ML IJ SOLN: 12.5000 mg | Freq: Four times a day (QID) | INTRAMUSCULAR | Status: DC | PRN
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
## 2011-05-08 ENCOUNTER — Inpatient Hospital Stay (HOSPITAL_COMMUNITY): Payer: No Typology Code available for payment source

## 2011-05-08 ENCOUNTER — Inpatient Hospital Stay (HOSPITAL_COMMUNITY): Payer: No Typology Code available for payment source | Admitting: Anesthesiology

## 2011-05-08 ENCOUNTER — Encounter (HOSPITAL_COMMUNITY): Payer: Self-pay | Admitting: Anesthesiology

## 2011-05-08 ENCOUNTER — Encounter (HOSPITAL_COMMUNITY): Payer: Self-pay | Admitting: Cardiology

## 2011-05-08 ENCOUNTER — Encounter (HOSPITAL_COMMUNITY)
Admission: EM | Disposition: A | Discharge status: Rehabilitation Facility | Payer: Self-pay | Source: Home / Self Care | Attending: Orthopedic Surgery

## 2011-05-08 DIAGNOSIS — I4891 Unspecified atrial fibrillation: Secondary | ICD-10-CM | POA: Diagnosis not present

## 2011-05-08 HISTORY — PX: ORIF ACETABULAR FRACTURE: SHX5029

## 2011-05-08 LAB — POCT I-STAT 4, (NA,K, GLUC, HGB,HCT)
Glucose, Bld: 127 mg/dL — ABNORMAL HIGH (ref 70–99)
HCT: 25 % — ABNORMAL LOW (ref 39.0–52.0)
Hemoglobin: 8.5 g/dL — ABNORMAL LOW (ref 13.0–17.0)
Potassium: 4.4 mEq/L (ref 3.5–5.1)
Sodium: 136 mEq/L (ref 135–145)

## 2011-05-08 LAB — CBC
HCT: 29.1 % — ABNORMAL LOW (ref 39.0–52.0)
Hemoglobin: 9.9 g/dL — ABNORMAL LOW (ref 13.0–17.0)
MCH: 30.5 pg (ref 26.0–34.0)
MCH: 31.7 pg (ref 26.0–34.0)
MCV: 87.9 fL (ref 78.0–100.0)
MCV: 89.7 fL (ref 78.0–100.0)
Platelets: 126 10*3/uL — ABNORMAL LOW (ref 150–400)
RBC: 3.31 MIL/uL — ABNORMAL LOW (ref 4.22–5.81)
RBC: 3.12 MIL/uL — ABNORMAL LOW (ref 4.22–5.81)

## 2011-05-08 LAB — CARDIAC PANEL(CRET KIN+CKTOT+MB+TROPI)
CK, MB: 3.1 ng/mL (ref 0.3–4.0)
Relative Index: 0.1 (ref 0.0–2.5)

## 2011-05-08 LAB — COMPREHENSIVE METABOLIC PANEL
AST: 54 U/L — ABNORMAL HIGH (ref 0–37)
Albumin: 2.2 g/dL — ABNORMAL LOW (ref 3.5–5.2)
Alkaline Phosphatase: 53 U/L (ref 39–117)
Chloride: 99 mEq/L (ref 96–112)
Creatinine, Ser: 0.72 mg/dL (ref 0.50–1.35)
Potassium: 3 mEq/L — ABNORMAL LOW (ref 3.5–5.1)
Total Bilirubin: 0.8 mg/dL (ref 0.3–1.2)
Total Protein: 4.8 g/dL — ABNORMAL LOW (ref 6.0–8.3)

## 2011-05-08 LAB — PREPARE RBC (CROSSMATCH)

## 2011-05-08 LAB — PROTIME-INR: Prothrombin Time: 16 seconds — ABNORMAL HIGH (ref 11.6–15.2)

## 2011-05-08 LAB — MAGNESIUM: Magnesium: 1.6 mg/dL (ref 1.5–2.5)

## 2011-05-08 SURGERY — OPEN REDUCTION INTERNAL FIXATION (ORIF) ACETABULAR FRACTURE
Anesthesia: General | Site: Abdomen | Laterality: Left | Wound class: Clean

## 2011-05-08 MED ORDER — GLYCOPYRROLATE 0.2 MG/ML IJ SOLN
INTRAMUSCULAR | Status: DC | PRN
  Administered 2011-05-08: .8 mg via INTRAVENOUS

## 2011-05-08 MED ORDER — DILTIAZEM HCL 100 MG IV SOLR
100.0000 mg | INTRAVENOUS | Status: DC | PRN
  Administered 2011-05-08: 15 mg/h via INTRAVENOUS

## 2011-05-08 MED ORDER — DIPHENHYDRAMINE HCL 25 MG PO CAPS: 25.0000 mg | ORAL_CAPSULE | Freq: Four times a day (QID) | ORAL | Status: DC | PRN

## 2011-05-08 MED ORDER — PHENYLEPHRINE HCL 10 MG/ML IJ SOLN
INTRAMUSCULAR | Status: DC | PRN
  Administered 2011-05-08: 40 ug via INTRAVENOUS
  Administered 2011-05-08 (×2): 80 ug via INTRAVENOUS
  Administered 2011-05-08: 40 ug via INTRAVENOUS
  Administered 2011-05-08: 80 ug via INTRAVENOUS

## 2011-05-08 MED ORDER — LACTATED RINGERS IV SOLN
INTRAVENOUS | Status: DC | PRN
  Administered 2011-05-08 (×3): via INTRAVENOUS

## 2011-05-08 MED ORDER — PROPOFOL 10 MG/ML IV EMUL
INTRAVENOUS | Status: DC | PRN
  Administered 2011-05-08: 100 mg via INTRAVENOUS

## 2011-05-08 MED ORDER — NEOSTIGMINE METHYLSULFATE 1 MG/ML IJ SOLN
INTRAMUSCULAR | Status: DC | PRN
  Administered 2011-05-08: 5 mg via INTRAVENOUS

## 2011-05-08 MED ORDER — MORPHINE SULFATE (PF) 1 MG/ML IV SOLN
INTRAVENOUS | Status: AC
  Filled 2011-05-08: qty 25

## 2011-05-08 MED ORDER — FENTANYL CITRATE 0.05 MG/ML IJ SOLN
INTRAMUSCULAR | Status: DC | PRN
  Administered 2011-05-08 (×6): 50 ug via INTRAVENOUS
  Administered 2011-05-08: 450 ug via INTRAVENOUS
  Administered 2011-05-08 (×4): 50 ug via INTRAVENOUS

## 2011-05-08 MED ORDER — MIDAZOLAM HCL 5 MG/5ML IJ SOLN
INTRAMUSCULAR | Status: DC | PRN
  Administered 2011-05-08: 2 mg via INTRAVENOUS

## 2011-05-08 MED ORDER — POTASSIUM CHLORIDE CRYS ER 20 MEQ PO TBCR
40.0000 meq | EXTENDED_RELEASE_TABLET | Freq: Once | ORAL | Status: AC
  Administered 2011-05-08: 40 meq via ORAL
  Filled 2011-05-08: qty 2

## 2011-05-08 MED ORDER — POTASSIUM CHLORIDE CRYS ER 20 MEQ PO TBCR
EXTENDED_RELEASE_TABLET | ORAL | Status: AC
  Administered 2011-05-08: 40 meq via ORAL
  Filled 2011-05-08: qty 2

## 2011-05-08 MED ORDER — LACTATED RINGERS IV SOLN
INTRAVENOUS | Status: DC | PRN
  Administered 2011-05-08 (×3): via INTRAVENOUS

## 2011-05-08 MED ORDER — METOPROLOL TARTRATE 25 MG PO TABS
25.0000 mg | ORAL_TABLET | ORAL | Status: DC
  Filled 2011-05-08: qty 1

## 2011-05-08 MED ORDER — POTASSIUM CHLORIDE CRYS ER 20 MEQ PO TBCR: 40.0000 meq | EXTENDED_RELEASE_TABLET | ORAL | Status: AC

## 2011-05-08 MED ORDER — POTASSIUM CHLORIDE CRYS ER 20 MEQ PO TBCR
80.0000 meq | EXTENDED_RELEASE_TABLET | Freq: Once | ORAL | Status: DC
  Administered 2011-05-08: 40 meq via ORAL

## 2011-05-08 MED ORDER — SODIUM CHLORIDE 0.9 % IV SOLN
INTRAVENOUS | Status: DC | PRN
  Administered 2011-05-08: 10:00:00 via INTRAVENOUS

## 2011-05-08 MED ORDER — ROCURONIUM BROMIDE 100 MG/10ML IV SOLN
INTRAVENOUS | Status: DC | PRN
  Administered 2011-05-08: 50 mg via INTRAVENOUS
  Administered 2011-05-08: 20 mg via INTRAVENOUS
  Administered 2011-05-08: 30 mg via INTRAVENOUS

## 2011-05-08 MED ORDER — LIDOCAINE HCL (CARDIAC) 20 MG/ML IV SOLN
INTRAVENOUS | Status: DC | PRN
  Administered 2011-05-08: 40 mg via INTRAVENOUS

## 2011-05-08 MED ORDER — HYDROMORPHONE HCL PF 1 MG/ML IJ SOLN
0.2500 mg | INTRAMUSCULAR | Status: DC | PRN
  Administered 2011-05-08 (×2): 0.5 mg via INTRAVENOUS

## 2011-05-08 MED ORDER — ONDANSETRON HCL 4 MG/2ML IJ SOLN
INTRAMUSCULAR | Status: DC | PRN
  Administered 2011-05-08: 4 mg via INTRAVENOUS

## 2011-05-08 MED ORDER — DILTIAZEM HCL 100 MG IV SOLR
10.0000 mg/h | INTRAVENOUS | Status: DC
  Administered 2011-05-08: 10 mg/h via INTRAVENOUS
  Filled 2011-05-08: qty 100

## 2011-05-08 MED ORDER — VECURONIUM BROMIDE 10 MG IV SOLR
INTRAVENOUS | Status: DC | PRN
  Administered 2011-05-08 (×3): 2 mg via INTRAVENOUS
  Administered 2011-05-08: 1 mg via INTRAVENOUS
  Administered 2011-05-08: 4 mg via INTRAVENOUS
  Administered 2011-05-08 (×2): 2 mg via INTRAVENOUS
  Administered 2011-05-08: 4 mg via INTRAVENOUS
  Administered 2011-05-08: 1 mg via INTRAVENOUS

## 2011-05-08 MED ORDER — POTASSIUM CHLORIDE CRYS ER 20 MEQ PO TBCR
EXTENDED_RELEASE_TABLET | ORAL | Status: AC
  Filled 2011-05-08: qty 2

## 2011-05-08 MED ORDER — OXYCODONE HCL 5 MG PO TABS
5.0000 mg | ORAL_TABLET | ORAL | Status: DC | PRN
  Administered 2011-05-09 – 2011-05-14 (×17): 5 mg via ORAL
  Filled 2011-05-08 (×17): qty 1

## 2011-05-08 MED ORDER — DILTIAZEM HCL 100 MG IV SOLR
10.0000 mg/h | INTRAVENOUS | Status: DC
  Administered 2011-05-08 – 2011-05-09 (×2): 10 mg/h via INTRAVENOUS
  Filled 2011-05-08 (×2): qty 100

## 2011-05-08 SURGICAL SUPPLY — 83 items
APPLIER CLIP 11 MED OPEN (CLIP) ×2
APPLIER CLIP 13 LRG OPEN (CLIP) ×2
APR CLP LRG 13 20 CLIP (CLIP) ×1
APR CLP MED 11 20 MLT OPN (CLIP) ×1
BANDAGE ELASTIC 6 VELCRO ST LF (GAUZE/BANDAGES/DRESSINGS) ×1 IMPLANT
BIT DRILL MATTA 2.5MX180M (BIT) IMPLANT
BIT DRILL TWST MATTA 3.5MX195M (BIT) IMPLANT
BLADE SURG ROTATE 9660 (MISCELLANEOUS) IMPLANT
BONE CHIP PRESERV 20CC (Bone Implant) IMPLANT
BRUSH SCRUB DISP (MISCELLANEOUS) ×4 IMPLANT
CLIP APPLIE 11 MED OPEN (CLIP) ×1 IMPLANT
CLIP APPLIE 13 LRG OPEN (CLIP) IMPLANT
CLOTH BEACON ORANGE TIMEOUT ST (SAFETY) ×2 IMPLANT
COVER SURGICAL LIGHT HANDLE (MISCELLANEOUS) ×4 IMPLANT
DRAIN CHANNEL 10F 3/8 F FF (DRAIN) ×2 IMPLANT
DRAIN CHANNEL 15F RND FF W/TCR (WOUND CARE) IMPLANT
DRAPE C-ARM 42X72 X-RAY (DRAPES) IMPLANT
DRAPE C-ARMOR (DRAPES) ×2 IMPLANT
DRAPE INCISE IOBAN 66X45 STRL (DRAPES) IMPLANT
DRAPE INCISE IOBAN 85X60 (DRAPES) ×4 IMPLANT
DRAPE ORTHO SPLIT 77X108 STRL (DRAPES) ×4
DRAPE SURG ORHT 6 SPLT 77X108 (DRAPES) ×2 IMPLANT
DRAPE U-SHAPE 47X51 STRL (DRAPES) ×2 IMPLANT
DRILL BIT MATTA 2.5MX180M (BIT) ×4
DRILL TWIST AO MATTA 3.5MX195M (BIT) ×2
DRSG ADAPTIC 3X8 NADH LF (GAUZE/BANDAGES/DRESSINGS) ×3 IMPLANT
DRSG PAD ABDOMINAL 8X10 ST (GAUZE/BANDAGES/DRESSINGS) ×4 IMPLANT
ELECT BLADE 6.5 EXT (BLADE) IMPLANT
ELECT REM PT RETURN 9FT ADLT (ELECTROSURGICAL) ×2
ELECTRODE REM PT RTRN 9FT ADLT (ELECTROSURGICAL) ×1 IMPLANT
EVACUATOR 1/8 PVC DRAIN (DRAIN) IMPLANT
EVACUATOR SILICONE 100CC (DRAIN) ×2 IMPLANT
GLOVE BIO SURGEON STRL SZ7.5 (GLOVE) ×2 IMPLANT
GLOVE BIO SURGEON STRL SZ8 (GLOVE) ×2 IMPLANT
GLOVE BIOGEL PI IND STRL 7.5 (GLOVE) ×1 IMPLANT
GLOVE BIOGEL PI IND STRL 8 (GLOVE) ×1 IMPLANT
GLOVE BIOGEL PI INDICATOR 7.5 (GLOVE) ×1
GLOVE BIOGEL PI INDICATOR 8 (GLOVE) ×1
GOWN PREVENTION PLUS XLARGE (GOWN DISPOSABLE) ×2 IMPLANT
GOWN PREVENTION PLUS XXLARGE (GOWN DISPOSABLE) ×2 IMPLANT
GOWN STRL NON-REIN LRG LVL3 (GOWN DISPOSABLE) ×4 IMPLANT
HANDPIECE INTERPULSE COAX TIP (DISPOSABLE) ×2
KIT BASIN OR (CUSTOM PROCEDURE TRAY) ×2 IMPLANT
KIT ROOM TURNOVER OR (KITS) ×2 IMPLANT
LIGHT ORTHO (MISCELLANEOUS) ×1 IMPLANT
LOOP VESSEL MAXI BLUE (MISCELLANEOUS) IMPLANT
MANIFOLD NEPTUNE II (INSTRUMENTS) ×2 IMPLANT
NDL MAYO TROCAR (NEEDLE) ×1 IMPLANT
NEEDLE MAYO TROCAR (NEEDLE) ×2 IMPLANT
NS IRRIG 1000ML POUR BTL (IV SOLUTION) ×2 IMPLANT
ORTHOLIGHT ×1 IMPLANT
PACK TOTAL JOINT (CUSTOM PROCEDURE TRAY) ×2 IMPLANT
PAD ARMBOARD 7.5X6 YLW CONV (MISCELLANEOUS) ×4 IMPLANT
PLATE ACET STRT 70.5M 6H (Plate) ×2 IMPLANT
PLATE CURVED MATTA 154.5M 10H (Plate) ×1 IMPLANT
RETRIEVER SUT HEWSON (MISCELLANEOUS) ×2 IMPLANT
SCREW CORTEX ST MATTA 3.5X110M (Screw) ×1 IMPLANT
SCREW CORTEX ST MATTA 3.5X20 (Screw) ×1 IMPLANT
SCREW CORTEX ST MATTA 3.5X22MM (Screw) ×1 IMPLANT
SCREW CORTEX ST MATTA 3.5X24 (Screw) ×3 IMPLANT
SCREW CORTEX ST MATTA 3.5X26MM (Screw) ×1 IMPLANT
SCREW CORTEX ST MATTA 3.5X38M (Screw) ×2 IMPLANT
SCREW CORTEX ST MATTA 3.5X45MM (Screw) ×1 IMPLANT
SCREW CORTEX ST MATTA 3.5X50MM (Screw) ×2 IMPLANT
SET HNDPC FAN SPRY TIP SCT (DISPOSABLE) ×1 IMPLANT
SPONGE GAUZE 4X4 12PLY (GAUZE/BANDAGES/DRESSINGS) ×3 IMPLANT
SPONGE LAP 18X18 X RAY DECT (DISPOSABLE) ×6 IMPLANT
STAPLER VISISTAT 35W (STAPLE) ×2 IMPLANT
SUCTION FRAZIER TIP 10 FR DISP (SUCTIONS) ×2 IMPLANT
SUT FIBERWIRE #2 38 T-5 BLUE (SUTURE) ×4
SUT VIC AB 0 CT1 27 (SUTURE) ×2
SUT VIC AB 0 CT1 27XBRD ANBCTR (SUTURE) ×4 IMPLANT
SUT VIC AB 1 CT1 18XCR BRD 8 (SUTURE) ×1 IMPLANT
SUT VIC AB 1 CT1 8-18 (SUTURE) ×4
SUT VIC AB 2-0 CT1 27 (SUTURE) ×6
SUT VIC AB 2-0 CT1 TAPERPNT 27 (SUTURE) ×4 IMPLANT
SUTURE FIBERWR #2 38 T-5 BLUE (SUTURE) ×2 IMPLANT
TAPE CLOTH SURG 6X10 WHT LF (GAUZE/BANDAGES/DRESSINGS) ×1 IMPLANT
TOWEL OR 17X24 6PK STRL BLUE (TOWEL DISPOSABLE) ×2 IMPLANT
TOWEL OR 17X26 10 PK STRL BLUE (TOWEL DISPOSABLE) ×4 IMPLANT
TRAY FOLEY CATH 14FR (SET/KITS/TRAYS/PACK) IMPLANT
WATER STERILE IRR 1000ML POUR (IV SOLUTION) ×8 IMPLANT
YANKAUER SUCT BULB TIP NO VENT (SUCTIONS) ×1 IMPLANT

## 2011-05-08 NOTE — Anesthesia Procedure Notes (Signed)
Procedure Name: Intubation Date/Time: 05/08/2011 9:53 AM Performed by: Lovie Chol Pre-anesthesia Checklist: Patient identified, Emergency Drugs available, Suction available and Patient being monitored Patient Re-evaluated:Patient Re-evaluated prior to inductionOxygen Delivery Method: Circle system utilized Preoxygenation: Pre-oxygenation with 100% oxygen Intubation Type: IV induction Ventilation: Two handed mask ventilation required and Oral airway inserted - appropriate to patient size Laryngoscope Size: Miller and 2 Grade View: Grade I Tube type: Oral Tube size: 7.5 mm Number of attempts: 1 Airway Equipment and Method: Stylet Placement Confirmation: ETT inserted through vocal cords under direct vision,  positive ETCO2 and breath sounds checked- equal and bilateral Secured at: 22 cm Tube secured with: Tape Dental Injury: Teeth and Oropharynx as per pre-operative assessment

## 2011-05-08 NOTE — Progress Notes (Signed)
Call from Dr. Dion Saucier regarding patient's status in current a-fib.  Order to transfer patient to telemetry floor because patient will need cardiac drip, which cannot be administered on this floor.  Lopressor 25mg  po d/ced.  Cardizem 10mg /hr ordered to be sent with patient to telemetry floor.  Patient transferred to 3700.  Report called and given.

## 2011-05-08 NOTE — Anesthesia Preprocedure Evaluation (Addendum)
Anesthesia Evaluation  Patient identified by MRN, date of birth, ID band Patient awake    Reviewed: Allergy & Precautions, H&P , NPO status , Patient's Chart, lab work & pertinent test results  History of Anesthesia Complications Negative for: history of anesthetic complications  Airway Mallampati: II TM Distance: >3 FB Neck ROM: Full    Dental No notable dental hx. (+) Teeth Intact and Dental Advisory Given   Pulmonary Current Smoker,  breath sounds clear to auscultation  Pulmonary exam normal       Cardiovascular + dysrhythmias (new onset afib, rate managed with diltiazem infusion) Atrial Fibrillation Rhythm:Irregular Rate:Tachycardia     Neuro/Psych negative neurological ROS     GI/Hepatic negative GI ROS, Neg liver ROS,   Endo/Other  Morbid obesity  Renal/GU negative Renal ROS     Musculoskeletal   Abdominal (+) + obese,   Peds  Hematology   Anesthesia Other Findings   Reproductive/Obstetrics                           Anesthesia Physical Anesthesia Plan  ASA: III  Anesthesia Plan: General   Post-op Pain Management:    Induction: Intravenous  Airway Management Planned: Oral ETT  Additional Equipment:   Intra-op Plan:   Post-operative Plan: Extubation in OR  Informed Consent: I have reviewed the patients History and Physical, chart, labs and discussed the procedure including the risks, benefits and alternatives for the proposed anesthesia with the patient or authorized representative who has indicated his/her understanding and acceptance.   Dental advisory given  Plan Discussed with: Surgeon and CRNA  Anesthesia Plan Comments: (Plan routine monitors, GETA)        Anesthesia Quick Evaluation

## 2011-05-08 NOTE — Anesthesia Postprocedure Evaluation (Signed)
  Anesthesia Post-op Note  Patient: [name]  Procedure(s) Performed: Procedure(s) (LRB): OPEN REDUCTION INTERNAL FIXATION (ORIF) ACETABULAR FRACTURE (Left)  Patient Location: PACU  Anesthesia Type: General  Level of Consciousness: awake  Airway and Oxygen Therapy: Patient Spontanous Breathing  Post-op Pain: none  Post-op Assessment: Post-op Vital signs reviewed  Post-op Vital Signs: stable  Complications: No apparent anesthesia complications
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
## 2011-05-08 NOTE — Preoperative (Signed)
Beta Blockers   Reason not to administer Beta Blockers:Not Applicable 

## 2011-05-08 NOTE — Progress Notes (Signed)
   Patient seen a few hours ago.  In atrial fib.  Rate is slower on diltiazem.  He is having no chest pain and no SOB.  He is in considerable pain with his hip fracture.  He has one negative set of enzymes.  He has no past history of CAD or recent symptoms.  He can go for this necessary surgery.  He can have diltiazem for rate control.  If he has any accelerated ventricular rate he would need DCCV.  Of note I am not clear how long he has been in this rhythm so would prefer not to cardiovert unless he becomes unstable.  I discussed this with the patient and his wife. Fayrene Fearing [facility] 05/08/2011 8:54 AM

## 2011-05-08 NOTE — Progress Notes (Signed)
Pt came to PACU on cardiazem drip that was started last night, per crna.  Rate was at 15 mg/hr. He had converted to nsr/st rate around 100 around noon today, per crna.  BP stable. Drip decreased to 10 mg/hr

## 2011-05-08 NOTE — Transfer of Care (Signed)
Immediate Anesthesia Transfer of Care Note  Patient: [name]  Procedure(s) Performed: Procedure(s) (LRB): OPEN REDUCTION INTERNAL FIXATION (ORIF) ACETABULAR FRACTURE (Left)  Patient Location: PACU  Anesthesia Type: General  Level of Consciousness: awake, alert  and patient cooperative  Airway & Oxygen Therapy: Patient connected to nasal cannula oxygen  Post-op Assessment: Report given to PACU RN, Post -op Vital signs reviewed and stable and Patient moving all extremities X 4  Post vital signs: Reviewed and stable  Complications: No apparent anesthesia complications

## 2011-05-08 NOTE — Progress Notes (Signed)
Patient experiencing continued fluctuating tachycardia between 110-132 this shift per monitor on PCA pump.  No s/s of chest pain or shortness of breath, or distress.  Spoke with Dr. Dion Saucier covering for Dr. Carola Frost, stat EKG, cardiac enzymes and Lopressor 25mg  po ordered.  EKG showing atrial fibrillation with rapid ventricular response.  Dr.  Dion Saucier aware.   Patient is scheduled to have surgery this am.  Lab called for STAT lab draw.  Patient to go to surgery as scheduled per Dr. Dion Saucier.

## 2011-05-08 NOTE — Brief Op Note (Signed)
05/05/2011 - 05/08/2011  6:32 PM  PATIENT:  [name]  51 y.o. male  PRE-OPERATIVE DIAGNOSIS:  Acetabular Fracture.  POST-OPERATIVE DIAGNOSIS:  Acetabular Fracture.  PROCEDURE:  Procedure(s) (LRB): OPEN REDUCTION INTERNAL FIXATION (ORIF) ACETABULAR FRACTURE (Left)  SURGEON:  Surgeon(s) and Role:    * Budd Palmer, MD - Primary  PHYSICIAN ASSISTANT: Montez Morita, [facility]  ANESTHESIA:   general  EBL:  Total I/O In: 7170 [I.V.:5480; Blood:1690] Out: 2200 [Urine:1000; Blood:1200]  DRAINS: none   LOCAL MEDICATIONS USED:  NONE  SPECIMEN:  No Specimen  DISPOSITION OF SPECIMEN:  N/A  COUNTS:  YES  TOURNIQUET:  * No tourniquets in log *  DICTATION: .Other Dictation: Dictation Number [id number]  PLAN OF CARE: Admit to inpatient   PATIENT DISPOSITION:  PACU - hemodynamically stable.   Delay start of Pharmacological VTE agent (>24hrs) due to surgical blood loss or risk of bleeding: yes

## 2011-05-08 NOTE — Progress Notes (Addendum)
Called regarding tachycardia and new ekg demonstrating a fib with RVR.  Wt Readings from Last 3 Encounters:  05/06/11 108.863 kg (240 lb)  05/06/11 108.863 kg (240 lb)  05/06/11 108.863 kg (240 lb)   Temp Readings from Last 3 Encounters:  05/08/11 99.6 F (37.6 C) Oral  05/08/11 99.6 F (37.6 C) Oral  05/08/11 99.6 F (37.6 C) Oral   BP Readings from Last 3 Encounters:  05/08/11 116/74  05/08/11 116/74  05/08/11 116/74   Pulse Readings from Last 3 Encounters:  05/08/11 110  05/08/11 110  05/08/11 110    02 99% on 2 L.  CBC    Component Value Date/Time   WBC 6.5 05/07/2011 0605   RBC 2.63* 05/07/2011 0605   HGB 8.1* 05/07/2011 0605   HCT 23.6* 05/07/2011 0605   PLT 117* 05/07/2011 0605   MCV 89.7 05/07/2011 0605   MCH 30.8 05/07/2011 0605   MCHC 34.3 05/07/2011 0605   RDW 13.2 05/07/2011 0605    New post transfusion labs pending.  He has never had history of a fib.  Father currently in house seeing [facility].  I have spoken with Dr. Antoine Poche who will be evaluating the patient now, and also spoke with anesthesia to prepare them, as Mr. Hyams is currently scheduled for first case this morning.  Will order cardiazem drip per Dr. Antoine Poche and transfer to tele, as the drip is not able to be administered on the floor.  Also ordered stat cardiac enzymes and the am preop labs.  Appreciate Dr. Jenene Slicker expeditious help.  Notified the OR as well.  OR eligibility pending until cardiology and anesthesia evaluation.  This is an urgent non-elective surgery, although can be delayed if surgery is medically currently contraindicated.  Eulas Post, MD 4:23 AM

## 2011-05-08 NOTE — Progress Notes (Signed)
Care of pt assumed by MA Kember Boch RN 

## 2011-05-09 ENCOUNTER — Ambulatory Visit: Payer: No Typology Code available for payment source | Admitting: Radiation Oncology

## 2011-05-09 ENCOUNTER — Encounter (HOSPITAL_COMMUNITY): Payer: Self-pay | Admitting: Orthopedic Surgery

## 2011-05-09 ENCOUNTER — Ambulatory Visit: Payer: No Typology Code available for payment source

## 2011-05-09 ENCOUNTER — Inpatient Hospital Stay (HOSPITAL_COMMUNITY): Payer: No Typology Code available for payment source

## 2011-05-09 LAB — CBC
HCT: 25.3 % — ABNORMAL LOW (ref 39.0–52.0)
Hemoglobin: 8.8 g/dL — ABNORMAL LOW (ref 13.0–17.0)
MCH: 31 pg (ref 26.0–34.0)
MCHC: 34.8 g/dL (ref 30.0–36.0)
MCV: 89.1 fL (ref 78.0–100.0)
Platelets: 138 10*3/uL — ABNORMAL LOW (ref 150–400)
RBC: 2.84 MIL/uL — ABNORMAL LOW (ref 4.22–5.81)
RDW: 13.7 % (ref 11.5–15.5)
WBC: 9.6 10*3/uL (ref 4.0–10.5)

## 2011-05-09 LAB — TYPE AND SCREEN
Unit division: 0
Unit division: 0
Unit division: 0
Unit division: 0
Unit division: 0

## 2011-05-09 LAB — COMPREHENSIVE METABOLIC PANEL
BUN: 6 mg/dL (ref 6–23)
CO2: 26 mEq/L (ref 19–32)
Calcium: 7.2 mg/dL — ABNORMAL LOW (ref 8.4–10.5)
Creatinine, Ser: 0.77 mg/dL (ref 0.50–1.35)
GFR calc Af Amer: >90 mL/min (ref >90)
GFR calc non Af Amer: >90 mL/min (ref >90)
Glucose, Bld: 132 mg/dL — ABNORMAL HIGH (ref 70–99)

## 2011-05-09 LAB — PREPARE FRESH FROZEN PLASMA: Unit division: 0

## 2011-05-09 LAB — PROTIME-INR
INR: 1.34 (ref 0.00–1.49)
Prothrombin Time: 16.8 seconds — ABNORMAL HIGH (ref 11.6–15.2)

## 2011-05-09 MED ORDER — SENNA 8.6 MG PO TABS
1.0000 | ORAL_TABLET | Freq: Every day | ORAL | Status: DC
  Administered 2011-05-09 – 2011-05-19 (×8): 8.6 mg via ORAL
  Filled 2011-05-09 (×12): qty 1

## 2011-05-09 MED ORDER — BISACODYL 10 MG RE SUPP: 10.0000 mg | Freq: Every day | RECTAL | Status: DC | PRN

## 2011-05-09 MED ORDER — POTASSIUM CHLORIDE CRYS ER 20 MEQ PO TBCR
40.0000 meq | EXTENDED_RELEASE_TABLET | Freq: Once | ORAL | Status: AC
  Administered 2011-05-09: 40 meq via ORAL
  Filled 2011-05-09: qty 2

## 2011-05-09 MED ORDER — MORPHINE SULFATE (PF) 1 MG/ML IV SOLN
INTRAVENOUS | Status: AC
  Administered 2011-05-09: 04:00:00
  Filled 2011-05-09: qty 25

## 2011-05-09 MED ORDER — MORPHINE SULFATE (PF) 1 MG/ML IV SOLN
INTRAVENOUS | Status: AC
  Filled 2011-05-09: qty 25

## 2011-05-09 MED ORDER — DILTIAZEM HCL 30 MG PO TABS
30.0000 mg | ORAL_TABLET | Freq: Four times a day (QID) | ORAL | Status: DC
  Administered 2011-05-09: 30 mg via ORAL
  Filled 2011-05-09 (×3): qty 1

## 2011-05-09 MED ORDER — MAGNESIUM HYDROXIDE NICU ORAL SYRINGE 400 MG/5 ML
15.0000 mL | Freq: Every day | ORAL | Status: DC | PRN
  Filled 2011-05-09: qty 15

## 2011-05-09 MED ORDER — MAGNESIUM HYDROXIDE 400 MG/5ML PO SUSP: 15.0000 mL | Freq: Every day | ORAL | Status: DC | PRN

## 2011-05-09 MED ORDER — DILTIAZEM HCL 30 MG PO TABS
30.0000 mg | ORAL_TABLET | Freq: Four times a day (QID) | ORAL | Status: DC
  Administered 2011-05-09 – 2011-05-14 (×16): 30 mg via ORAL
  Filled 2011-05-09 (×22): qty 1

## 2011-05-09 MED ORDER — DEXTROSE 5 % IV SOLN
5.0000 mg/h | INTRAVENOUS | Status: DC
  Administered 2011-05-09: 10 mg/h via INTRAVENOUS
  Administered 2011-05-09: 5 mg/h via INTRAVENOUS
  Administered 2011-05-09: 15 mg/h via INTRAVENOUS
  Administered 2011-05-09: 5 mg/h via INTRAVENOUS
  Administered 2011-05-10: 15 mg/h via INTRAVENOUS
  Administered 2011-05-10: 10 mg/h via INTRAVENOUS
  Filled 2011-05-09 (×3): qty 100

## 2011-05-09 NOTE — Progress Notes (Signed)
Utilization review completed. Georgia Delsignore, RN, BSN. 05/09/11 

## 2011-05-09 NOTE — Op Note (Signed)
NAMEREXFORD, PREVO               ACCOUNT NO.:  [id number]  MEDICAL RECORD NO.:  [id number]  LOCATION:  3707                         FACILITY:  [facility]  PHYSICIAN:  Doralee Albino. Carola Frost, M.D. DATE OF BIRTH:  March 08, 1960  DATE OF PROCEDURE:  05/08/2011 DATE OF DISCHARGE:                              OPERATIVE REPORT   PREOPERATIVE DIAGNOSIS:  Left transtectal T-type fracture with posterior wall fracture as well.  POSTOPERATIVE DIAGNOSIS:  Left transtectal T-type fracture with posterior wall fracture as well.  PROCEDURES: 1. Open reduction and internal fixation through an anterior and     ilioinguinal approach of a transverse T-type fracture with repair     of the anterior column and partial repair of ischium/ posterior column. 2. Repair of anterior pelvic ring.  SURGEON:  Doralee Albino. Carola Frost, MD  ASSISTANT:  PA student.  COMPLICATIONS:  None.  IN'S/OUT'S:  7170 total (5480 mL crystalloid, blood 1690, out 2200 total, urine 1000, blood 1200 mL).  DRAINS:  None.  DISPOSITION:  To PACU.  CONDITION:  Stable.  BRIEF SUMMARY OF INDICATION FOR PROCEDURE:  [name] is a 51 year old who sustained a severe left acetabular fracture with protrusio dislocation, who has been treated with traction and underwent ORIF of his tibia and now presents for definitive reconstruction.  I did discuss the severely poor prognosis with this pattern of fracture with the patient and his wife including the likelihood of early-onset arthritis and need for eventual total hip arthroplasty.  I also explained the risk of heterotopic bone, persistent instability or redislocation, nerve injury, vessel injury, the possibility of sexual dysfunction, neurologic dysfunction, and DVT, PE as well as multiple others.  Furthermore, I did discuss preoperatively with Dr. Antoine Poche the patient's atrial fibrillation, which was diagnosed early this morning and for which, he was transferred to telemetry and underwent  evaluation.  I also discussed this specifically with Dr. Jairo Ben from Anesthesia who felt that we could proceed under a Cardizem drip and control.  Family understood this risk and did wish to proceed.  BRIEF SUMMARY OF PROCEDURE:  [name] received vancomycin preoperatively.  He was taken to the operating room where general anesthesia was induced.  He was positioned with his operative leg in traction and then traction was hooked up such that he could maintain 20 pounds to keep his hip in a reduced position.  I also changed the splint and found his wounds to be somewhat ecchymotic with some superficial skin loss, but no fully threatened wounds.  He did have considerable swelling present.  After a sterile prep and drape, a Pfannenstiel incision was made.  I did encounter the left spermatic cord very early in the dissection and this seemed quite medial and of significant size. It was protected throughout the procedure by placing a 0.5-inch Penrose for identification and mobility.  I did incise along the ilioinguinal incision and ultimately had to develop that interval releasing the iliopectineal eminence underneath the femoral sheath and also placing a Penrose around the femoral sheath and other around the iliopsoas tendon. The hip was brought into about 50 degrees of flexion and the traction adjusted for that trajectory.  I did pad all prominences  appropriately. In this stepwise fashion, I was able to clearly exposed the fracture and removed a large piece of the quadrilateral surface from the anterior joint capsule where it had lodged.  This fragment was nearly 2 x 2 cm and there were numerous and it was completely stripped of any blood supply.  Similarly, there were numerous pieces of quadrilateral plate and surface that were within the pelvis and there was a defect over the weightbearing dome and quadrilateral plate of at least 4 x 4 cm.  The inferior portion of the T fracture  was severely rotated and displaced. I was unable to effectively derotate this piece from within the pelvis because its comminution despite several attempts.  I was able to fix the anterior column and it appeared to interdigitate with the small quadrilateral plate surface as well.  Sciatic nerve was visualized and protected.  Similarly, the obturator nerve was seen and protected.  I did use hemoclips to control some anastomoses there.  The gross displacement of the ischium anteriorly was mobilized and secured initially with a lag screw and then ultimately a plate allowing it to be moved into position. I did place two screws, but there was a sufficient mobility to allow for dialing in of the fracture posteriorly and stabilizing it there in the posterior procedure to follow.  I was concerned about the considerable comminution along the posterior column and consequently did choose to fix the ischium to move toward sufficient cumulative fixation of this component of the fracture.  I divided along the pectineus splitting it in line with the fibers with maintenance of all the periosteal attachments save at the edge of the fracture to assist with reduction.  I placed additional screws up in the superior window of the ilioinguinal, and on inlet view, these could be seen to be outside of the SI joint.  Three screws were placed anterior to the fracture and a lag screw as well.  The operator system then did appear to develop venous bleeding perhaps secondary to migration of one of the hemoclips.  This was challenging to control initially, but we were able to achieve that, but I did feel that further intrapelvic dissection along the Stoppa window through which I was working was ill- advised as posterior approach was required for fixation of the posterior wall at any rate.  Consequently, we irrigated thoroughly, confirmed that we had excellent hemostatic control and then performed a layered closure using  #1 Vicryl for the musculature of the brim and then a 0 Vicryl, 2-0 Vicryl, and staples for the skin.  Conjoint tendon was repaired with #1 Vicryl, and then a sterile gently compressive dressing was applied.  A new dressing Adaptic was also applied to the leg and an Ace wrap from foot to thigh.  The patient was placed back into 20 pounds of traction, transported to PACU in stable condition.  PA student assisted me throughout the procedure, was necessary for safe and effective completion.  She did protect the bladder throughout with use of malleable.  PROGNOSIS:  [name] will need to return to the OR in the next 72 hours for repair of his posterior column and wall.  I am hopeful this will be a much more straightforward procedure given the improvement in his reduction.  His hip does not appear unstable, but certainly he is at risk for further protrusio until I can restore integrity of the posterior column.  I also plan to apply extensive graft once the column repair  is complete.  He remains at severe risk for arthritis and had palpable changes to his femoral head and the visible scalps.  He had severe impaction along the transtectal fracture and also severe bone loss.  All of these factors translate into a very poor prognosis, but our goal is to repair bone stock particularly integrity of the columns so that he can undergo a total hip arthroplasty when symptoms warrant. Given the degree of displacement of his ischium and pubis, I am also concerned about his potential for sexual dysfunction, this will need to be followed and reassessed as well.     Doralee Albino. Carola Frost, M.D.     MHH/MEDQ  D:  05/08/2011  T:  05/09/2011  Job:  [id number]

## 2011-05-09 NOTE — Progress Notes (Signed)
SUBJECTIVE:  Less pain.  No chest pain.  No SOB.   PHYSICAL EXAM Filed Vitals:   05/09/11 0354 05/09/11 0400 05/09/11 0800 05/09/11 1200  BP:  112/73 117/66 109/68  Pulse:  102 100 105  Temp:  98.9 F (37.2 C) 99.8 F (37.7 C) 100.5 F (38.1 C)  TempSrc:   Oral Oral  Resp:  20 18 18   Height:      Weight:      SpO2: 95% 93% 97% 94%   General:  No distress Lungs:  Clear Heart:  RRR Abdomen:  Positive bowel sounds, no rebound no guarding Extremities:  Mild edema.  LABS: Lab Results  Component Value Date   CKTOTAL 2479* 05/08/2011   CKMB 3.1 05/08/2011   TROPONINI <0.30 05/08/2011   Results for orders placed during the hospital encounter of 05/05/11 (from the past 24 hour(s))  TYPE AND SCREEN     Status: Normal (Preliminary result)   Collection Time   05/08/11  3:44 PM      Component Value Range   ABO/RH(D) O POS     Antibody Screen NEG     Sample Expiration 05/11/2011     Unit Number [id number]     Blood Component Type RED CELLS,LR     Unit division 00     Status of Unit ALLOCATED     Transfusion Status OK TO TRANSFUSE     Crossmatch Result Compatible     Unit Number [id number]     Blood Component Type RED CELLS,LR     Unit division 00     Status of Unit ALLOCATED     Transfusion Status OK TO TRANSFUSE     Crossmatch Result Compatible    PREPARE RBC (CROSSMATCH)     Status: Normal   Collection Time   05/08/11  4:05 PM      Component Value Range   Order Confirmation ORDER PROCESSED BY BLOOD BANK    POCT I-STAT 4, (NA,K, GLUC, HGB,HCT)     Status: Abnormal   Collection Time   05/08/11  4:07 PM      Component Value Range   Sodium 137  135 - 145 (mEq/L)   Potassium 4.3  3.5 - 5.1 (mEq/L)   Glucose, Bld 143 (*) 70 - 99 (mg/dL)   HCT 78.2 (*) 95.6 - 52.0 (%)   Hemoglobin 8.5 (*) 13.0 - 17.0 (g/dL)  COMPREHENSIVE METABOLIC PANEL     Status: Abnormal   Collection Time   05/09/11  5:38 AM      Component Value Range   Sodium 131 (*) 135 - 145 (mEq/L)   Potassium  3.4 (*) 3.5 - 5.1 (mEq/L)   Chloride 96  96 - 112 (mEq/L)   CO2 26  19 - 32 (mEq/L)   Glucose, Bld 132 (*) 70 - 99 (mg/dL)   BUN 6  6 - 23 (mg/dL)   Creatinine, Ser 2.13  0.50 - 1.35 (mg/dL)   Calcium 7.2 (*) 8.4 - 10.5 (mg/dL)   Total Protein 4.6 (*) 6.0 - 8.3 (g/dL)   Albumin 2.0 (*) 3.5 - 5.2 (g/dL)   AST 97 (*) 0 - 37 (U/L)   ALT 43  0 - 53 (U/L)   Alkaline Phosphatase 47  39 - 117 (U/L)   Total Bilirubin 1.0  0.3 - 1.2 (mg/dL)   GFR calc non Af Amer >90  >90 (mL/min)   GFR calc Af Amer >90  >90 (mL/min)  PROTIME-INR     Status: Abnormal  Collection Time   05/09/11  5:38 AM      Component Value Range   Prothrombin Time 16.8 (*) 11.6 - 15.2 (seconds)   INR 1.34  0.00 - 1.49   CBC     Status: Abnormal   Collection Time   05/09/11  5:38 AM      Component Value Range   WBC 9.6  4.0 - 10.5 (K/uL)   RBC 2.84 (*) 4.22 - 5.81 (MIL/uL)   Hemoglobin 8.8 (*) 13.0 - 17.0 (g/dL)   HCT 16.1 (*) 09.6 - 52.0 (%)   MCV 89.1  78.0 - 100.0 (fL)   MCH 31.0  26.0 - 34.0 (pg)   MCHC 34.8  30.0 - 36.0 (g/dL)   RDW 04.5  40.9 - 81.1 (%)   Platelets 138 (*) 150 - 400 (K/uL)    Intake/Output Summary (Last 24 hours) at 05/09/11 1515 Last data filed at 05/09/11 1450  Gross per 24 hour  Intake   3260 ml  Output   2815 ml  Net    445 ml     ASSESSMENT AND PLAN:   1)  Acetabulum fracture, left:  Status post ORIF   2)  Atrial fibrillation:  NSR.  Check EKG.  Stop Cardizem IV.  I am going to start a low dose po Cardizem.  Echo is apparently still pending.  At this point (pending further arrhythmias or an abnormal echo) since this seems to be a self limited episode of afib under extraordinary circumstances, I don't think that long term anticoagulation would needed for cardiovascular indications.    Fayrene Fearing [employer] 05/09/2011 3:15 PM

## 2011-05-09 NOTE — Progress Notes (Signed)
Pt with complains of scrotal pain. Visibly more swollen this afternoon than this am and running a low grade temp. Dr. Carola Frost notified of this and no new orders. He will come round on patient. WIll continue to monitor

## 2011-05-09 NOTE — Progress Notes (Signed)
Subjective: 1 Day Post-Op Procedure(s) (LRB): OPEN REDUCTION INTERNAL FIXATION (ORIF) ACETABULAR FRACTURE (Left) Patient reports pain as mild.  Scrotal swelling and edema severe; reduced sensation left thigh and scrotum. Scrotal ultrasound pending.  Objective: Current Vitals Blood pressure 119/69, pulse 103, temperature 99.6 F (37.6 C), temperature source Oral, resp. rate 18, height 5\' 9"  (1.753 m), weight 240 lb (108.863 kg), SpO2 94.00%. Vital signs in last 24 hours: Temp:  [98.9 F (37.2 C)-100.5 F (38.1 C)] 99.6 F (37.6 C) (04/12 1600) Pulse Rate:  [100-105] 103  (04/12 1600) Resp:  [16-20] 18  (04/12 1600) BP: (106-128)/(41-73) 119/69 mmHg (04/12 1600) SpO2:  [93 %-99 %] 94 % (04/12 1600) FiO2 (%):  [94 %] 94 % (04/12 1600)  Intake/Output from previous day: 04/11 0701 - 04/12 0700 In: 7400 [P.O.:50; I.V.:5660; Blood:1690] Out: 2400 [Urine:1200; Blood:1200]  LABS  Basename 05/09/11 0538 05/08/11 1607 05/08/11 1426 05/08/11 1412 05/08/11 0400  HGB 8.8* 8.5* 9.9* 9.9* 10.1*    Basename 05/09/11 0538 05/08/11 1607 05/08/11 1426  WBC 9.6 -- 8.1  RBC 2.84* -- 3.12*  HCT 25.3* 25.0* --  PLT 138* -- 130*    Basename 05/09/11 0538 05/08/11 1607 05/08/11 0400  NA 131* 137 --  K 3.4* 4.3 --  CL 96 -- 99  CO2 26 -- 29  BUN 6 -- 5*  CREATININE 0.77 -- 0.72  GLUCOSE 132* 143* --  CALCIUM 7.2* -- 7.8*    Basename 05/09/11 0538 05/08/11 1426  LABPT -- --  INR 1.34 1.25    Physical Exam  Gen:in good spirits, A&o Lungs:no wheezing Cardiac:IRIRR ZOX:[id number], positive flatus Pelvis:Wound clean, drsg clean; pain well controlled; scrotum ecchymotic, edematous, decreased sensation on left side Ext:LLE intact DPN, SPN, TN sens and motor intact; DP 2+; edema moderate and no worse than pre-op   Imaging Dg Pelvis 3v Judet  05/09/2011  *RADIOLOGY REPORT*  Clinical Data: Status post acetabular reconstruction.  JUDET PELVIS - 3+ VIEW  Comparison: CT scan of the pelvis  05/06/2011.  Intraoperative C-arm films 05/08/2011  Findings: Patient is status post open reduction internal fixation of complex acetabular fracture and obturator ring fracture. Malleable plate and screw fixation device was used.  Improved position and alignment.  IMPRESSION: As above.  Original Report Authenticated By: Elsie Stain, M.D.   Dg Pelvis 3v Judet  05/08/2011  *RADIOLOGY REPORT*  Clinical Data: Left acetabular fracture.  JUDET PELVIS - 3+ VIEW  Comparison: 05/06/2011.  Findings: Seven intraoperative fluoroscopic spot films demonstrate ORIF of left pelvic and acetabular fractures with malleable metal plate and screw fixation.  Sponge marker is present in the anatomic pelvis.  IMPRESSION: ORIF of the left obturator ring and acetabular fracture.  Original Report Authenticated By: Andreas Newport, M.D.   Dg C-arm Gt 120 Min  05/08/2011  *RADIOLOGY REPORT*  Clinical Data: Assess had a fracture.  ORIF left pelvic fracture.  C-ARM GT 120 MIN  Comparison: 05/06/2011.  Findings: Malleable metal plate and screw fixation of the complex comminuted a left pelvic fracture.  Single fluoroscopic spot films submitted for interpretation.  Radiopaque sponge marker is present in the left anatomic pelvis.  IMPRESSION: ORIF left obturator ring and acetabulum.  Original Report Authenticated By: Andreas Newport, M.D.    Assessment/Plan: 1 Day Post-Op Procedure(s) (LRB): OPEN REDUCTION INTERNAL FIXATION (ORIF) ACETABULAR FRACTURE (Left)  Return to OR on Monday for ORIF of posterior column and wall components of the fracture. Advance diet and bowel regimen Ice pack and scrotal sling/ elevation; scrotal u/s  prelim is negative  Afib--> cards has placed back on cardiazem drip Continue Lovenox Acute blood loss anemia; will likely require another transfusion on Saturday or Sunday in anticipation of surgery  05/09/2011, 8:27 PM

## 2011-05-09 NOTE — Progress Notes (Signed)
3cc of PCA morphine wasted in sharps container. Witnessed by 2 RNs. Olevia Bowens and Ulyess Mort, RN

## 2011-05-10 LAB — BASIC METABOLIC PANEL WITH GFR
BUN: 9 mg/dL (ref 6–23)
CO2: 25 meq/L (ref 19–32)
Calcium: 7.7 mg/dL — ABNORMAL LOW (ref 8.4–10.5)
Chloride: 100 meq/L (ref 96–112)
Creatinine, Ser: 0.66 mg/dL (ref 0.50–1.35)
GFR calc Af Amer: >90 mL/min
GFR calc non Af Amer: >90 mL/min
Glucose, Bld: 130 mg/dL — ABNORMAL HIGH (ref 70–99)
Potassium: 3.3 meq/L — ABNORMAL LOW (ref 3.5–5.1)
Sodium: 136 meq/L (ref 135–145)

## 2011-05-10 LAB — CBC
HCT: 24.7 % — ABNORMAL LOW (ref 39.0–52.0)
Platelets: 188 10*3/uL (ref 150–400)
RBC: 2.75 MIL/uL — ABNORMAL LOW (ref 4.22–5.81)
RDW: 13.8 % (ref 11.5–15.5)
WBC: 11.3 10*3/uL — ABNORMAL HIGH (ref 4.0–10.5)

## 2011-05-10 MED ORDER — OFF THE BEAT BOOK
Freq: Once | Status: DC
  Filled 2011-05-10 (×3): qty 1

## 2011-05-10 NOTE — Progress Notes (Signed)
MD notified of pt afib HR sustaining 125-130. Pt on Cardizem drip  and titrated to 15mg . Dr. Fausto Skillern ordered PO cardizem 30mg  q6hrs. Orders followed through, will continue to monitor.

## 2011-05-10 NOTE — Progress Notes (Signed)
   Patient ID: [name], male   DOB: [birth year], 51 y.o.   MRN: [id number]   SUBJECTIVE:  Less pain.  No chest pain.  No SOB.   PHYSICAL EXAM Filed Vitals:   05/10/11 0011 05/10/11 0400 05/10/11 0500 05/10/11 0800  BP:   116/58   Pulse:   100   Temp:   98.4 F (36.9 C)   TempSrc:   Oral   Resp:   18 18  Height:      Weight:      SpO2: 96% 95% 97% 95%   General:  No distress Lungs:  Clear Heart:  RRR Abdomen:  Positive bowel sounds, no rebound no guarding Extremities:  Mild edema. LLE: with orthopedic device  LABS: Lab Results  Component Value Date   CKTOTAL 2479* 05/08/2011   CKMB 3.1 05/08/2011   TROPONINI <0.30 05/08/2011   Results for orders placed during the hospital encounter of 05/05/11 (from the past 24 hour(s))  CBC     Status: Abnormal   Collection Time   05/10/11  6:00 AM      Component Value Range   WBC 11.3 (*) 4.0 - 10.5 (K/uL)   RBC 2.75 (*) 4.22 - 5.81 (MIL/uL)   Hemoglobin 8.4 (*) 13.0 - 17.0 (g/dL)   HCT 40.9 (*) 81.1 - 52.0 (%)   MCV 89.8  78.0 - 100.0 (fL)   MCH 30.5  26.0 - 34.0 (pg)   MCHC 34.0  30.0 - 36.0 (g/dL)   RDW 91.4  78.2 - 95.6 (%)   Platelets 188  150 - 400 (K/uL)  BASIC METABOLIC PANEL     Status: Abnormal   Collection Time   05/10/11  6:00 AM      Component Value Range   Sodium 136  135 - 145 (mEq/L)   Potassium 3.3 (*) 3.5 - 5.1 (mEq/L)   Chloride 100  96 - 112 (mEq/L)   CO2 25  19 - 32 (mEq/L)   Glucose, Bld 130 (*) 70 - 99 (mg/dL)   BUN 9  6 - 23 (mg/dL)   Creatinine, Ser 2.13  0.50 - 1.35 (mg/dL)   Calcium 7.7 (*) 8.4 - 10.5 (mg/dL)   GFR calc non Af Amer >90  >90 (mL/min)   GFR calc Af Amer >90  >90 (mL/min)    Intake/Output Summary (Last 24 hours) at 05/10/11 1106 Last data filed at 05/10/11 0600  Gross per 24 hour  Intake      0 ml  Output   5300 ml  Net  -5300 ml   Telemetry:  NSR rate 70's  ASSESSMENT AND PLAN:   1)  Acetabulum fracture, left:  Status post ORIF   2)  Atrial fibrillation:  Converted  to NSR  Stop Cardizem IV.  I am going to start a low dose po Cardizem.  Will check on echo today .  At this point (pending further arrhythmias or an abnormal echo) since this seems to be a self limited episode of afib under extraordinary circumstances, I don't think that long term anticoagulation would needed for cardiovascular indications.    Charlton Haws 05/10/2011 11:06 AM

## 2011-05-10 NOTE — Progress Notes (Signed)
Patient ID: [name], male   DOB: [birth year], 51 y.o.   MRN: [id number]     2 Days Post-Op   Subjective:  Patient reports pain as mild to moderate.  Patient states that he is doing well overall    Objective:   VITALS:   Filed Vitals:   05/10/11 0011 05/10/11 0400 05/10/11 0500 05/10/11 0800  BP:   116/58   Pulse:   100   Temp:   98.4 F (36.9 C)   TempSrc:   Oral   Resp:   18 18  Height:      Weight:      SpO2: 96% 95% 97% 95%    ABD soft Sensation intact distally Dorsiflexion/Plantar flexion intact Incision: dressing C/D/I and no drainage  LABS Lab Results  Component Value Date   HGB 8.4* 05/10/2011   HGB 8.8* 05/09/2011   HGB 8.5* 05/08/2011   CBC    Component Value Date/Time   WBC 11.3* 05/10/2011 0600   RBC 2.75* 05/10/2011 0600   HGB 8.4* 05/10/2011 0600   HCT 24.7* 05/10/2011 0600   PLT 188 05/10/2011 0600   MCV 89.8 05/10/2011 0600   MCH 30.5 05/10/2011 0600   MCHC 34.0 05/10/2011 0600   RDW 13.8 05/10/2011 0600    Lab Results  Component Value Date   INR 1.34 05/09/2011   Lab Results  Component Value Date   NA 136 05/10/2011   K 3.3* 05/10/2011   CL 100 05/10/2011   CO2 25 05/10/2011   BUN 9 05/10/2011   CREATININE 0.66 05/10/2011   GLUCOSE 130* 05/10/2011   Ct 3d Recon At Scanner  05/09/2011  *RADIOLOGY REPORT*  Clinical Data:  Complex displaced left acetabular fractures.  CT PELVIS WITHOUT CONTRAST  Technique:  Multidetector CT imaging of the pelvis was performed following the standard protocol without intravenous contrast.  Comparison:  CT abdomen/pelvis04/08/2011.  Findings:  There is a complex comminuted acetabular fracture with marked displacement and fragmentation involving all three column of the acetabulum.  There is associated protrusio of the femoral head. Multiple small bone fragments in the extraperitoneal pelvis along with extraperitoneal pelvic hematoma.  The femoral head and neck are intact.  The left SI joint is slightly widened.  The pubic  symphysis is intact.  No sacral fractures.  There is a displaced fracture involving the left inferior pubic ramus.  The bladder is decompressed by a Foley catheter.  No obvious bladder injury.  IMPRESSION:  1.  Severely comminuted and displaced three column fracture of the left acetabulum with associated protrusio of the femoral head. 2.  Slight widening of the left SI joint. 3.  Displaced inferior pubic rami fracture on the left. 4.  Extraperitoneal pelvic hematoma along with hematoma involving the left thigh musculature.  Original Report Authenticated By: P. Loralie Champagne, M.D.   US Scrotum  05/09/2011  *RADIOLOGY REPORT*  Clinical Data: Increased scrotal swelling  ULTRASOUND OF SCROTUM  Technique:  Complete ultrasound examination of the testicles, epididymis, and other scrotal structures was performed.  Comparison:  CT pelvis 05/06/2011  Findings:  Right testis:  4.6 x 3.2 x 3.0 cm.  There is normal color Doppler flow.  Left testis:  4.2 x 3.2 x 3.6 cm.  There is normal color Doppler flow.  Right epididymis:  Normal in size and appearance.  Left epididymis:  Normal in size and appearance.  Hydrocele:  Trace hydroceles.  Varicocele:  A none  There is significant scrotal wall swelling.  IMPRESSION: 1.  Testicles normal with normal color flow. 2.  Small bilateral hydroceles. 3.  Extensive  scrotal edema.  Original Report Authenticated By: Genevive Bi, M.D.   Dg Pelvis 3v Judet  05/09/2011  *RADIOLOGY REPORT*  Clinical Data: Status post acetabular reconstruction.  JUDET PELVIS - 3+ VIEW  Comparison: CT scan of the pelvis 05/06/2011.  Intraoperative C-arm films 05/08/2011  Findings: Patient is status post open reduction internal fixation of complex acetabular fracture and obturator ring fracture. Malleable plate and screw fixation device was used.  Improved position and alignment.  IMPRESSION: As above.  Original Report Authenticated By: Elsie Stain, M.D.   Dg Pelvis 3v Judet  05/08/2011  *RADIOLOGY  REPORT*  Clinical Data: Left acetabular fracture.  JUDET PELVIS - 3+ VIEW  Comparison: 05/06/2011.  Findings: Seven intraoperative fluoroscopic spot films demonstrate ORIF of left pelvic and acetabular fractures with malleable metal plate and screw fixation.  Sponge marker is present in the anatomic pelvis.  IMPRESSION: ORIF of the left obturator ring and acetabular fracture.  Original Report Authenticated By: Andreas Newport, M.D.   Dg C-arm Gt 120 Min  05/08/2011  *RADIOLOGY REPORT*  Clinical Data: Assess had a fracture.  ORIF left pelvic fracture.  C-ARM GT 120 MIN  Comparison: 05/06/2011.  Findings: Malleable metal plate and screw fixation of the complex comminuted a left pelvic fracture.  Single fluoroscopic spot films submitted for interpretation.  Radiopaque sponge marker is present in the left anatomic pelvis.  IMPRESSION: ORIF left obturator ring and acetabulum.  Original Report Authenticated By: Andreas Newport, M.D.    Assessment/Plan: Principal Problem:  *Acetabulum fracture, left Active Problems:  Fx shaft tibia w/ fib-open, left  Multiple closed fxs of pelvis with stable disruption of pelvic circle, left  Acute blood loss anemia  ETOH abuse  Nicotine use disorder  Atrial fibrillation   Advance diet Continue Traction per Dr Carola Frost  Plan for surgery on Monday   Haskel Khan 05/10/2011, 11:30 AM

## 2011-05-11 LAB — CBC
HCT: 24.7 % — ABNORMAL LOW (ref 39.0–52.0)
Hemoglobin: 8.4 g/dL — ABNORMAL LOW (ref 13.0–17.0)
WBC: 13.2 10*3/uL — ABNORMAL HIGH (ref 4.0–10.5)

## 2011-05-11 LAB — BASIC METABOLIC PANEL
BUN: 11 mg/dL (ref 6–23)
Chloride: 100 mEq/L (ref 96–112)
Glucose, Bld: 115 mg/dL — ABNORMAL HIGH (ref 70–99)
Potassium: 3.3 mEq/L — ABNORMAL LOW (ref 3.5–5.1)

## 2011-05-11 LAB — HEMOGLOBIN AND HEMATOCRIT, BLOOD: HCT: 28 % — ABNORMAL LOW (ref 39.0–52.0)

## 2011-05-11 MED ORDER — DIPHENHYDRAMINE HCL 25 MG PO CAPS
25.0000 mg | ORAL_CAPSULE | Freq: Once | ORAL | Status: AC
  Administered 2011-05-11: 25 mg via ORAL
  Filled 2011-05-11: qty 1

## 2011-05-11 MED ORDER — ACETAMINOPHEN 325 MG PO TABS
650.0000 mg | ORAL_TABLET | Freq: Once | ORAL | Status: AC
  Administered 2011-05-11: 650 mg via ORAL
  Filled 2011-05-11: qty 2

## 2011-05-11 NOTE — Progress Notes (Signed)
    Patient ID: [name], male   DOB: 12-Dec-1960, 51 y.o.   MRN: [id number]   SUBJECTIVE:  Less pain.  No chest pain.  No SOB.   PHYSICAL EXAM Filed Vitals:   05/10/11 2200 05/11/11 0625 05/11/11 0814 05/11/11 0900  BP: 121/65 118/64  132/78  Pulse: 93 91  98  Temp: 99.1 F (37.3 C) 99.5 F (37.5 C)    TempSrc:      Resp: 18 20 18    Height:      Weight:      SpO2: 93% 92%  97%   General:  No distress Lungs:  Clear Heart:  RRR Abdomen:  Positive bowel sounds, no rebound no guarding Extremities:  Mild edema. LLE: with orthopedic device  LABS: Lab Results  Component Value Date   CKTOTAL 2479* 05/08/2011   CKMB 3.1 05/08/2011   TROPONINI <0.30 05/08/2011   Results for orders placed during the hospital encounter of 05/05/11 (from the past 24 hour(s))  CBC     Status: Abnormal   Collection Time   05/11/11  6:50 AM      Component Value Range   WBC 13.2 (*) 4.0 - 10.5 (K/uL)   RBC 2.75 (*) 4.22 - 5.81 (MIL/uL)   Hemoglobin 8.4 (*) 13.0 - 17.0 (g/dL)   HCT 82.9 (*) 56.2 - 52.0 (%)   MCV 89.8  78.0 - 100.0 (fL)   MCH 30.5  26.0 - 34.0 (pg)   MCHC 34.0  30.0 - 36.0 (g/dL)   RDW 13.0  86.5 - 78.4 (%)   Platelets 241  150 - 400 (K/uL)  BASIC METABOLIC PANEL     Status: Abnormal   Collection Time   05/11/11  6:50 AM      Component Value Range   Sodium 136  135 - 145 (mEq/L)   Potassium 3.3 (*) 3.5 - 5.1 (mEq/L)   Chloride 100  96 - 112 (mEq/L)   CO2 27  19 - 32 (mEq/L)   Glucose, Bld 115 (*) 70 - 99 (mg/dL)   BUN 11  6 - 23 (mg/dL)   Creatinine, Ser 6.96  0.50 - 1.35 (mg/dL)   Calcium 8.0 (*) 8.4 - 10.5 (mg/dL)   GFR calc non Af Amer >90  >90 (mL/min)   GFR calc Af Amer >90  >90 (mL/min)    Intake/Output Summary (Last 24 hours) at 05/11/11 1057 Last data filed at 05/11/11 0500  Gross per 24 hour  Intake      0 ml  Output   2525 ml  Net  -2525 ml   Telemetry:  NSR rate 70's  ASSESSMENT AND PLAN:   1)  Acetabulum fracture, left:  Status post ORIF   2)   Atrial fibrillation:  Converted to NSR  On oral cardizem  NO anticoagulation per [facility].  Does not appear that echo has been done over weekend yet    Charlton Haws 05/11/2011 10:57 AM

## 2011-05-11 NOTE — Progress Notes (Addendum)
Patient ID: [name], male   DOB: 1960-03-10, 51 y.o.   MRN: [id number]     3 Days Post-Op   Subjective:  Patient reports pain as mild to moderate.  Patient wants to know how long before he goes home. He denies any CP or SOB.    Objective:   VITALS:   Filed Vitals:   05/10/11 2200 05/11/11 0625 05/11/11 0814 05/11/11 0900  BP: 121/65 118/64  132/78  Pulse: 93 91  98  Temp: 99.1 F (37.3 C) 99.5 F (37.5 C)    TempSrc:      Resp: 18 20 18    Height:      Weight:      SpO2: 93% 92%  97%    ABD soft Sensation intact distally Dorsiflexion/Plantar flexion intact Incision: dressing C/D/I and scant drainage Leg dressings changed, peroxide and xeroform to pin sites.  Wounds dry. Positive scrotal swelling, still numb on left side.  LABS Lab Results  Component Value Date   HGB 8.4* 05/11/2011   HGB 8.4* 05/10/2011   HGB 8.8* 05/09/2011   CBC    Component Value Date/Time   WBC 13.2* 05/11/2011 0650   RBC 2.75* 05/11/2011 0650   HGB 8.4* 05/11/2011 0650   HCT 24.7* 05/11/2011 0650   PLT 241 05/11/2011 0650   MCV 89.8 05/11/2011 0650   MCH 30.5 05/11/2011 0650   MCHC 34.0 05/11/2011 0650   RDW 13.7 05/11/2011 0650    Lab Results  Component Value Date   INR 1.34 05/09/2011   Lab Results  Component Value Date   NA 136 05/11/2011   K 3.3* 05/11/2011   CL 100 05/11/2011   CO2 27 05/11/2011   BUN 11 05/11/2011   CREATININE 0.69 05/11/2011   GLUCOSE 115* 05/11/2011   US Scrotum  05/09/2011  *RADIOLOGY REPORT*  Clinical Data: Increased scrotal swelling  ULTRASOUND OF SCROTUM  Technique:  Complete ultrasound examination of the testicles, epididymis, and other scrotal structures was performed.  Comparison:  CT pelvis 05/06/2011  Findings:  Right testis:  4.6 x 3.2 x 3.0 cm.  There is normal color Doppler flow.  Left testis:  4.2 x 3.2 x 3.6 cm.  There is normal color Doppler flow.  Right epididymis:  Normal in size and appearance.  Left epididymis:  Normal in size and appearance.   Hydrocele:  Trace hydroceles.  Varicocele:  A none  There is significant scrotal wall swelling.  IMPRESSION: 1. Testicles normal with normal color flow. 2.  Small bilateral hydroceles. 3.  Extensive  scrotal edema.  Original Report Authenticated By: Genevive Bi, M.D.    Assessment/Plan: Principal Problem:  *Acetabulum fracture, left Active Problems:  Fx shaft tibia w/ fib-open, left  Multiple closed fxs of pelvis with stable disruption of pelvic circle, left  Acute blood loss anemia  ETOH abuse  Nicotine use disorder  Atrial fibrillation   Patient in Skeletal traction. Plan for OR tomorrow  NPO after midnight  Type and Cross 2 units to be on hand for Surgery tomorrow] ABLA, will transfuse 1 unit today and recheck h/h after transfusion.   Haskel Khan 05/11/2011, 10:41 AM   Teryl Lucy, MD [phone] pager

## 2011-05-11 NOTE — Progress Notes (Addendum)
I agree with the assessment and medication administration by Tommi Rumps, [facility] student from 7p-7a.

## 2011-05-12 ENCOUNTER — Encounter (HOSPITAL_COMMUNITY)
Admission: EM | Disposition: A | Discharge status: Rehabilitation Facility | Payer: Self-pay | Source: Home / Self Care | Attending: Orthopedic Surgery

## 2011-05-12 ENCOUNTER — Encounter (HOSPITAL_COMMUNITY): Payer: Self-pay | Admitting: Anesthesiology

## 2011-05-12 ENCOUNTER — Inpatient Hospital Stay (HOSPITAL_COMMUNITY): Payer: No Typology Code available for payment source

## 2011-05-12 ENCOUNTER — Inpatient Hospital Stay (HOSPITAL_COMMUNITY): Payer: No Typology Code available for payment source | Admitting: Anesthesiology

## 2011-05-12 DIAGNOSIS — E876 Hypokalemia: Secondary | ICD-10-CM

## 2011-05-12 HISTORY — PX: ORIF ACETABULAR FRACTURE: SHX5029

## 2011-05-12 LAB — TYPE AND SCREEN
ABO/RH(D): O POS
Unit division: 0

## 2011-05-12 LAB — BASIC METABOLIC PANEL
BUN: 12 mg/dL (ref 6–23)
CO2: 27 mEq/L (ref 19–32)
Chloride: 98 mEq/L (ref 96–112)
Glucose, Bld: 110 mg/dL — ABNORMAL HIGH (ref 70–99)
Potassium: 3.1 mEq/L — ABNORMAL LOW (ref 3.5–5.1)
Sodium: 134 mEq/L — ABNORMAL LOW (ref 135–145)

## 2011-05-12 LAB — CBC
HCT: 28.6 % — ABNORMAL LOW (ref 39.0–52.0)
Hemoglobin: 9.6 g/dL — ABNORMAL LOW (ref 13.0–17.0)
RBC: 3.17 MIL/uL — ABNORMAL LOW (ref 4.22–5.81)
WBC: 12.6 10*3/uL — ABNORMAL HIGH (ref 4.0–10.5)

## 2011-05-12 SURGERY — OPEN REDUCTION INTERNAL FIXATION (ORIF) ACETABULAR FRACTURE
Anesthesia: General | Site: Hip | Laterality: Left | Wound class: Clean

## 2011-05-12 MED ORDER — ROCURONIUM BROMIDE 100 MG/10ML IV SOLN
INTRAVENOUS | Status: DC | PRN
  Administered 2011-05-12: 50 mg via INTRAVENOUS
  Administered 2011-05-12: 20 mg via INTRAVENOUS
  Administered 2011-05-12: 10 mg via INTRAVENOUS
  Administered 2011-05-12: 20 mg via INTRAVENOUS
  Administered 2011-05-12 (×2): 10 mg via INTRAVENOUS
  Administered 2011-05-12: 20 mg via INTRAVENOUS

## 2011-05-12 MED ORDER — SODIUM CHLORIDE 0.9 % IV SOLN: INTRAVENOUS | Status: DC

## 2011-05-12 MED ORDER — DIPHENHYDRAMINE HCL 12.5 MG/5ML PO ELIX
12.5000 mg | ORAL_SOLUTION | Freq: Four times a day (QID) | ORAL | Status: DC | PRN
  Filled 2011-05-12: qty 5

## 2011-05-12 MED ORDER — NEOSTIGMINE METHYLSULFATE 1 MG/ML IJ SOLN
INTRAMUSCULAR | Status: DC | PRN
  Administered 2011-05-12: 5 mg via INTRAVENOUS

## 2011-05-12 MED ORDER — ONDANSETRON HCL 4 MG/2ML IJ SOLN
INTRAMUSCULAR | Status: DC | PRN
  Administered 2011-05-12: 4 mg via INTRAVENOUS

## 2011-05-12 MED ORDER — HETASTARCH-ELECTROLYTES 6 % IV SOLN
INTRAVENOUS | Status: DC | PRN
  Administered 2011-05-12: 16:00:00 via INTRAVENOUS

## 2011-05-12 MED ORDER — FENTANYL CITRATE 0.05 MG/ML IJ SOLN
INTRAMUSCULAR | Status: DC | PRN
  Administered 2011-05-12 (×4): 50 ug via INTRAVENOUS
  Administered 2011-05-12 (×2): 100 ug via INTRAVENOUS
  Administered 2011-05-12: 50 ug via INTRAVENOUS
  Administered 2011-05-12: 100 ug via INTRAVENOUS
  Administered 2011-05-12: 50 ug via INTRAVENOUS
  Administered 2011-05-12 (×2): 100 ug via INTRAVENOUS
  Administered 2011-05-12: 50 ug via INTRAVENOUS

## 2011-05-12 MED ORDER — DIPHENHYDRAMINE HCL 50 MG/ML IJ SOLN
12.5000 mg | Freq: Four times a day (QID) | INTRAMUSCULAR | Status: DC | PRN
  Filled 2011-05-12: qty 0.25

## 2011-05-12 MED ORDER — PROPOFOL 10 MG/ML IV EMUL
INTRAVENOUS | Status: DC | PRN
  Administered 2011-05-12: 200 mg via INTRAVENOUS

## 2011-05-12 MED ORDER — HYDROMORPHONE 0.3 MG/ML IV SOLN
INTRAVENOUS | Status: DC
  Administered 2011-05-12: 25 mL via INTRAVENOUS
  Administered 2011-05-13 (×2): via INTRAVENOUS
  Administered 2011-05-13: 2.5 mg via INTRAVENOUS
  Administered 2011-05-13: 8.7 mg via INTRAVENOUS
  Administered 2011-05-13: 15:00:00 via INTRAVENOUS

## 2011-05-12 MED ORDER — NALOXONE HCL 0.4 MG/ML IJ SOLN
0.4000 mg | INTRAMUSCULAR | Status: DC | PRN
  Filled 2011-05-12: qty 1

## 2011-05-12 MED ORDER — MORPHINE SULFATE 4 MG/ML IJ SOLN: 0.0500 mg/kg | INTRAMUSCULAR | Status: DC | PRN

## 2011-05-12 MED ORDER — POTASSIUM CHLORIDE IN NACL 20-0.9 MEQ/L-% IV SOLN
INTRAVENOUS | Status: DC
  Administered 2011-05-13 (×2): via INTRAVENOUS
  Filled 2011-05-12 (×3): qty 1000

## 2011-05-12 MED ORDER — EPHEDRINE SULFATE 50 MG/ML IJ SOLN
INTRAMUSCULAR | Status: DC | PRN
  Administered 2011-05-12: 10 mg via INTRAVENOUS

## 2011-05-12 MED ORDER — METOCLOPRAMIDE HCL 5 MG PO TABS: 5.0000 mg | ORAL_TABLET | Freq: Three times a day (TID) | ORAL | Status: DC | PRN

## 2011-05-12 MED ORDER — CEFAZOLIN SODIUM 1-5 GM-% IV SOLN
1.0000 g | Freq: Three times a day (TID) | INTRAVENOUS | Status: AC
  Administered 2011-05-13 (×3): 1 g via INTRAVENOUS
  Filled 2011-05-12 (×3): qty 50

## 2011-05-12 MED ORDER — ONDANSETRON HCL 4 MG/2ML IJ SOLN: 4.0000 mg | Freq: Four times a day (QID) | INTRAMUSCULAR | Status: DC | PRN

## 2011-05-12 MED ORDER — ONDANSETRON HCL 4 MG/2ML IJ SOLN
4.0000 mg | Freq: Four times a day (QID) | INTRAMUSCULAR | Status: DC | PRN
  Filled 2011-05-12: qty 2

## 2011-05-12 MED ORDER — MIDAZOLAM HCL 5 MG/5ML IJ SOLN
INTRAMUSCULAR | Status: DC | PRN
  Administered 2011-05-12: 2 mg via INTRAVENOUS

## 2011-05-12 MED ORDER — METOCLOPRAMIDE HCL 5 MG/ML IJ SOLN: 5.0000 mg | Freq: Three times a day (TID) | INTRAMUSCULAR | Status: DC | PRN

## 2011-05-12 MED ORDER — LACTATED RINGERS IV SOLN: INTRAVENOUS | Status: DC

## 2011-05-12 MED ORDER — VECURONIUM BROMIDE 10 MG IV SOLR
INTRAVENOUS | Status: DC | PRN
  Administered 2011-05-12: 2 mg via INTRAVENOUS

## 2011-05-12 MED ORDER — ONDANSETRON HCL 4 MG PO TABS: 4.0000 mg | ORAL_TABLET | Freq: Four times a day (QID) | ORAL | Status: DC | PRN

## 2011-05-12 MED ORDER — THROMBIN 20000 UNITS EX KIT
PACK | CUTANEOUS | Status: DC | PRN
  Administered 2011-05-12: 20000 [IU] via TOPICAL

## 2011-05-12 MED ORDER — CEFAZOLIN SODIUM-DEXTROSE 2-3 GM-% IV SOLR
2.0000 g | Freq: Once | INTRAVENOUS | Status: DC
  Filled 2011-05-12: qty 50

## 2011-05-12 MED ORDER — GLYCOPYRROLATE 0.2 MG/ML IJ SOLN
INTRAMUSCULAR | Status: DC | PRN
  Administered 2011-05-12: 0.6 mg via INTRAVENOUS

## 2011-05-12 MED ORDER — SODIUM CHLORIDE 0.9 % IV SOLN
INTRAVENOUS | Status: DC | PRN
  Administered 2011-05-12: 16:00:00 via INTRAVENOUS

## 2011-05-12 MED ORDER — SODIUM CHLORIDE 0.9 % IJ SOLN: 9.0000 mL | INTRAMUSCULAR | Status: DC | PRN

## 2011-05-12 MED ORDER — LACTATED RINGERS IV SOLN
INTRAVENOUS | Status: DC | PRN
  Administered 2011-05-12 (×3): via INTRAVENOUS

## 2011-05-12 MED ORDER — HYDROMORPHONE HCL PF 1 MG/ML IJ SOLN
0.2500 mg | INTRAMUSCULAR | Status: DC | PRN
Start: 2011-05-12 — End: 2011-05-12

## 2011-05-12 SURGICAL SUPPLY — 101 items
4.5mm cortical screw matta ×1 IMPLANT
APPLIER CLIP 11 MED OPEN (CLIP) ×2
APPLIER CLIP 9.375 MED OPEN (MISCELLANEOUS) ×2
APR CLP MED 11 20 MLT OPN (CLIP) ×1
APR CLP MED 9.3 20 MLT OPN (MISCELLANEOUS) ×1
BIT DRILL AO MATTA 2.5MX230M (BIT) IMPLANT
BIT DRILL TWST MATTA 3.5MX195M (BIT) IMPLANT
BLADE SURG ROTATE 9660 (MISCELLANEOUS) IMPLANT
BONE CHIP PRESERV 20CC (Bone Implant) ×1 IMPLANT
BRUSH SCRUB DISP (MISCELLANEOUS) ×4 IMPLANT
CLIP APPLIE 11 MED OPEN (CLIP) ×1 IMPLANT
CLIP APPLIE 9.375 MED OPEN (MISCELLANEOUS) IMPLANT
CLOTH BEACON ORANGE TIMEOUT ST (SAFETY) ×2 IMPLANT
COVER SURGICAL LIGHT HANDLE (MISCELLANEOUS) ×4 IMPLANT
DRAIN CHANNEL 10F 3/8 F FF (DRAIN) ×2 IMPLANT
DRAIN CHANNEL 15F RND FF W/TCR (WOUND CARE) IMPLANT
DRAPE C-ARM 42X72 X-RAY (DRAPES) IMPLANT
DRAPE C-ARMOR (DRAPES) ×2 IMPLANT
DRAPE INCISE IOBAN 66X45 STRL (DRAPES) IMPLANT
DRAPE INCISE IOBAN 85X60 (DRAPES) ×4 IMPLANT
DRAPE ORTHO SPLIT 77X108 STRL (DRAPES) ×4
DRAPE STERILE CONDUCTIVE (DRAPES) ×1 IMPLANT
DRAPE SURG ORHT 6 SPLT 77X108 (DRAPES) ×2 IMPLANT
DRAPE U-SHAPE 47X51 STRL (DRAPES) ×2 IMPLANT
DRILL BIT AO MATTA 2.5MX230M (BIT) ×4
DRILL TWIST AO MATTA 3.5MX195M (BIT) ×2
DRSG ADAPTIC 3X8 NADH LF (GAUZE/BANDAGES/DRESSINGS) ×2 IMPLANT
DRSG MEPILEX BORDER 4X12 (GAUZE/BANDAGES/DRESSINGS) ×1 IMPLANT
DRSG MEPILEX BORDER 4X8 (GAUZE/BANDAGES/DRESSINGS) ×3 IMPLANT
DRSG PAD ABDOMINAL 8X10 ST (GAUZE/BANDAGES/DRESSINGS) ×4 IMPLANT
ELECT BLADE 4.0 EZ CLEAN MEGAD (MISCELLANEOUS) ×2
ELECT BLADE 6.5 EXT (BLADE) IMPLANT
ELECT REM PT RETURN 9FT ADLT (ELECTROSURGICAL) ×2
ELECTRODE BLDE 4.0 EZ CLN MEGD (MISCELLANEOUS) IMPLANT
ELECTRODE REM PT RTRN 9FT ADLT (ELECTROSURGICAL) ×1 IMPLANT
EVACUATOR 1/8 PVC DRAIN (DRAIN) IMPLANT
EVACUATOR SILICONE 100CC (DRAIN) ×2 IMPLANT
EXPLEXUR MEDIUM (Putty) ×1 IMPLANT
GLOVE BIO SURGEON STRL SZ7.5 (GLOVE) ×2 IMPLANT
GLOVE BIO SURGEON STRL SZ8 (GLOVE) ×2 IMPLANT
GLOVE BIOGEL PI IND STRL 7.5 (GLOVE) ×1 IMPLANT
GLOVE BIOGEL PI IND STRL 8 (GLOVE) ×1 IMPLANT
GLOVE BIOGEL PI INDICATOR 7.5 (GLOVE) ×1
GLOVE BIOGEL PI INDICATOR 8 (GLOVE) ×1
GOWN PREVENTION PLUS XLARGE (GOWN DISPOSABLE) ×2 IMPLANT
GOWN PREVENTION PLUS XXLARGE (GOWN DISPOSABLE) ×2 IMPLANT
GOWN STRL NON-REIN LRG LVL3 (GOWN DISPOSABLE) ×4 IMPLANT
HANDPIECE INTERPULSE COAX TIP (DISPOSABLE) ×2
KIT BASIN OR (CUSTOM PROCEDURE TRAY) ×2 IMPLANT
KIT ROOM TURNOVER OR (KITS) ×2 IMPLANT
LIGHT ORTHO (MISCELLANEOUS) ×1 IMPLANT
LOOP VESSEL MAXI BLUE (MISCELLANEOUS) IMPLANT
MANIFOLD NEPTUNE II (INSTRUMENTS) ×3 IMPLANT
NDL MAYO TROCAR (NEEDLE) ×1 IMPLANT
NEEDLE MAYO TROCAR (NEEDLE) ×2 IMPLANT
NS IRRIG 1000ML POUR BTL (IV SOLUTION) ×2 IMPLANT
ORTHOLIGHT SURGICAL ILLUMINATOR ×1 IMPLANT
PACK TOTAL JOINT (CUSTOM PROCEDURE TRAY) ×2 IMPLANT
PAD ARMBOARD 7.5X6 YLW CONV (MISCELLANEOUS) ×4 IMPLANT
PILLOW ABDUCTION HIP (SOFTGOODS) ×1 IMPLANT
PIN 6.0X180MM REDUCTION (PIN) ×1 IMPLANT
PIN APEX 180X5XRDC SS (PIN) IMPLANT
PIN APEX 5X180 (PIN) ×2
PIN REDUCTION 6.0 (PIN) ×1 IMPLANT
PLATE ACET STRT 118.5M 10H (Plate) ×1 IMPLANT
PLATE ACET STRT 46.5M 4H (Plate) ×1 IMPLANT
PLATE ACET STRT 70.5M 6H (Plate) ×1 IMPLANT
RETRIEVER SUT HEWSON (MISCELLANEOUS) ×3 IMPLANT
SCREW CORTEX ST MATTA 3.5X16MM (Screw) ×1 IMPLANT
SCREW CORTEX ST MATTA 3.5X20 (Screw) ×1 IMPLANT
SCREW CORTEX ST MATTA 3.5X22MM (Screw) ×1 IMPLANT
SCREW CORTEX ST MATTA 3.5X30MM (Screw) ×1 IMPLANT
SCREW CORTEX ST MATTA 3.5X32MM (Screw) ×1 IMPLANT
SCREW CORTEX ST MATTA 3.5X34MM (Screw) ×2 IMPLANT
SCREW CORTEX ST MATTA 3.5X36MM (Screw) ×1 IMPLANT
SCREW CORTEX ST MATTA 3.5X38M (Screw) ×1 IMPLANT
SCREW CORTEX ST MATTA 3.5X40MM (Screw) ×1 IMPLANT
SET HNDPC FAN SPRY TIP SCT (DISPOSABLE) ×1 IMPLANT
SLEEVE SURGEON STRL (DRAPES) ×1 IMPLANT
SPONGE GAUZE 4X4 12PLY (GAUZE/BANDAGES/DRESSINGS) ×2 IMPLANT
SPONGE LAP 18X18 X RAY DECT (DISPOSABLE) ×6 IMPLANT
STAPLER VISISTAT 35W (STAPLE) ×2 IMPLANT
SUCTION FRAZIER TIP 10 FR DISP (SUCTIONS) ×2 IMPLANT
SUT FIBERWIRE #2 38 T-5 BLUE (SUTURE) ×8
SUT SILK 2 0 (SUTURE) ×2
SUT SILK 2 0 SH (SUTURE) ×2 IMPLANT
SUT SILK 2-0 18XBRD TIE 12 (SUTURE) IMPLANT
SUT VIC AB 0 CT1 27 (SUTURE) ×8
SUT VIC AB 0 CT1 27XBRD ANBCTR (SUTURE) ×4 IMPLANT
SUT VIC AB 1 CT1 18XCR BRD 8 (SUTURE) ×1 IMPLANT
SUT VIC AB 1 CT1 27 (SUTURE) ×4
SUT VIC AB 1 CT1 27XBRD ANBCTR (SUTURE) IMPLANT
SUT VIC AB 1 CT1 8-18 (SUTURE) ×2
SUT VIC AB 2-0 CT1 27 (SUTURE) ×8
SUT VIC AB 2-0 CT1 TAPERPNT 27 (SUTURE) ×4 IMPLANT
SUTURE FIBERWR #2 38 T-5 BLUE (SUTURE) ×2 IMPLANT
TOWEL OR 17X24 6PK STRL BLUE (TOWEL DISPOSABLE) ×2 IMPLANT
TOWEL OR 17X26 10 PK STRL BLUE (TOWEL DISPOSABLE) ×5 IMPLANT
TRAY FOLEY CATH 14FR (SET/KITS/TRAYS/PACK) IMPLANT
TUBE CONNECTING 12X1/4 (SUCTIONS) ×1 IMPLANT
WATER STERILE IRR 1000ML POUR (IV SOLUTION) ×8 IMPLANT

## 2011-05-12 NOTE — Progress Notes (Signed)
Patient ID: [name], male   DOB: 1960-12-06, 51 y.o.   MRN: [id number]   SUBJECTIVE:  Less pain.  No chest pain.  No SOB. To OR today   PHYSICAL EXAM Filed Vitals:   05/11/11 1830 05/11/11 1900 05/11/11 2100 05/12/11 0600  BP:  124/62 110/67 122/60  Pulse: 96 92 96 90  Temp: 99.1 F (37.3 C) 98.6 F (37 C) 99.4 F (37.4 C) 99.9 F (37.7 C)  TempSrc:      Resp: 18 18 20 18   Height:      Weight:      SpO2:   97% 97%   General:  No distress Lungs:  Clear Heart:  RRR Abdomen:  Positive bowel sounds, no rebound no guarding Extremities:  Mild edema. LLE: with orthopedic device  LABS: Lab Results  Component Value Date   CKTOTAL 2479* 05/08/2011   CKMB 3.1 05/08/2011   TROPONINI <0.30 05/08/2011   Results for orders placed during the hospital encounter of 05/05/11 (from the past 24 hour(s))  PREPARE RBC (CROSSMATCH)     Status: Normal   Collection Time   05/11/11  2:20 PM      Component Value Range   Order Confirmation ORDER PROCESSED BY BLOOD BANK    TYPE AND SCREEN     Status: Normal (Preliminary result)   Collection Time   05/11/11  2:20 PM      Component Value Range   ABO/RH(D) O POS     Antibody Screen NEG     Sample Expiration 05/14/2011     Unit Number [id number]     Blood Component Type RED CELLS,LR     Unit division 00     Status of Unit ALLOCATED     Transfusion Status OK TO TRANSFUSE     Crossmatch Result Compatible     Unit Number [id number]     Blood Component Type RED CELLS,LR     Unit division 00     Status of Unit ALLOCATED     Transfusion Status OK TO TRANSFUSE     Crossmatch Result Compatible    HEMOGLOBIN AND HEMATOCRIT, BLOOD     Status: Abnormal   Collection Time   05/11/11  8:01 PM      Component Value Range   Hemoglobin 9.5 (*) 13.0 - 17.0 (g/dL)   HCT 62.9 (*) 52.8 - 52.0 (%)  CBC     Status: Abnormal   Collection Time   05/12/11  6:35 AM      Component Value Range   WBC 12.6 (*) 4.0 - 10.5 (K/uL)   RBC 3.17 (*) 4.22 - 5.81  (MIL/uL)   Hemoglobin 9.6 (*) 13.0 - 17.0 (g/dL)   HCT 41.3 (*) 24.4 - 52.0 (%)   MCV 90.2  78.0 - 100.0 (fL)   MCH 30.3  26.0 - 34.0 (pg)   MCHC 33.6  30.0 - 36.0 (g/dL)   RDW 01.0  27.2 - 53.6 (%)   Platelets 293  150 - 400 (K/uL)  BASIC METABOLIC PANEL     Status: Abnormal   Collection Time   05/12/11  6:35 AM      Component Value Range   Sodium 134 (*) 135 - 145 (mEq/L)   Potassium 3.1 (*) 3.5 - 5.1 (mEq/L)   Chloride 98  96 - 112 (mEq/L)   CO2 27  19 - 32 (mEq/L)   Glucose, Bld 110 (*) 70 - 99 (mg/dL)   BUN 12  6 -  23 (mg/dL)   Creatinine, Ser 9.14  0.50 - 1.35 (mg/dL)   Calcium 8.2 (*) 8.4 - 10.5 (mg/dL)   GFR calc non Af Amer >90  >90 (mL/min)   GFR calc Af Amer >90  >90 (mL/min)    Intake/Output Summary (Last 24 hours) at 05/12/11 0809 Last data filed at 05/12/11 0345  Gross per 24 hour  Intake   2040 ml  Output   5400 ml  Net  -3360 ml   Telemetry:  NSR rate 70's  ASSESSMENT AND PLAN:   1)  Acetabulum fracture, left:  Status post ORIF OR today to fix pelvis   2)  Atrial fibrillation:  Converted to NSR  On oral cardizem  NO anticoagulation per [facility].  Does not appear that echo has been done over weekend yet    Charlton Haws 05/12/2011 8:09 AM

## 2011-05-12 NOTE — Brief Op Note (Signed)
05/05/2011 - 05/12/2011  8:20 PM  PATIENT:  [name]  51 y.o. male  PRE-OPERATIVE DIAGNOSIS:  Left Acetabulum Fracture, T-type and posterior wall with severe comminution  POST-OPERATIVE DIAGNOSIS:  Left Acetabulum Fracture, T-type and posterior wall with severe comminution 2. Complete abductor avulsion  PROCEDURE:  Procedure(s) (LRB): 1. OPEN REDUCTION INTERNAL FIXATION (ORIF) ACETABULAR FRACTURE (Left), T-type and posterior wall 2. Repair of hip abductors, gluteus medius and minimus  SURGEON:  Surgeon(s) and Role:    * Budd Palmer, MD - Primary  PHYSICIAN ASSISTANT: Montez Morita, [facility]  ASSISTANTS: PA student  ANESTHESIA:   general  I/O: 4000cc cystalloid, 700cc PRBC; 1500cc UOP, 1000cc EBL  DRAINS: none   LOCAL MEDICATIONS USED:  NONE  SPECIMEN:  No Specimen  DISPOSITION OF SPECIMEN:  N/A  COUNTS:  YES  TOURNIQUET:  * No tourniquets in log *  DICTATION: .Other Dictation: Dictation Number [id number]  PLAN OF CARE: Admit to inpatient   PATIENT DISPOSITION:  PACU - hemodynamically stable.   Delay start of Pharmacological VTE agent (>24hrs) due to surgical blood loss or risk of bleeding: no

## 2011-05-12 NOTE — Progress Notes (Signed)
Patient scheduled for Radiation treatment tomorrow, 05/13/2011 at 1300.  Spoke with Doug in [place] and arranged transport to be there by 1230.  Colman Cater

## 2011-05-12 NOTE — Preoperative (Signed)
Beta Blockers   Reason not to administer Beta Blockers:Not Applicable 

## 2011-05-12 NOTE — Anesthesia Postprocedure Evaluation (Signed)
Anesthesia Post Note  Patient: [name]  Procedure(s) Performed: Procedure(s) (LRB): OPEN REDUCTION INTERNAL FIXATION (ORIF) ACETABULAR FRACTURE (Left)  Anesthesia type: General  Patient location: PACU  Post pain: Pain level controlled and Adequate analgesia  Post assessment: Post-op Vital signs reviewed, Patient's Cardiovascular Status Stable, Respiratory Function Stable, Patent Airway and Pain level controlled  Last Vitals:  Filed Vitals:   05/12/11 2100  BP:   Pulse: 107  Temp:   Resp: 30    Post vital signs: Reviewed and stable  Level of consciousness: awake, alert  and oriented  Complications: No apparent anesthesia complications

## 2011-05-12 NOTE — Anesthesia Preprocedure Evaluation (Signed)
Anesthesia Evaluation  Patient identified by MRN, date of birth, ID band Patient awake    Reviewed: Allergy & Precautions, H&P , NPO status , Patient's Chart, lab work & pertinent test results  Airway Mallampati: II      Dental   Pulmonary neg pulmonary ROS,  breath sounds clear to auscultation        Cardiovascular + dysrhythmias Atrial Fibrillation Rhythm:Regular Rate:Normal     Neuro/Psych negative neurological ROS     GI/Hepatic negative GI ROS, Neg liver ROS,   Endo/Other  negative endocrine ROS  Renal/GU negative Renal ROS     Musculoskeletal   Abdominal   Peds  Hematology negative hematology ROS (+)   Anesthesia Other Findings   Reproductive/Obstetrics                           Anesthesia Physical Anesthesia Plan  ASA: II  Anesthesia Plan:    Post-op Pain Management:    Induction: Intravenous  Airway Management Planned: Mask  Additional Equipment:   Intra-op Plan:   Post-operative Plan: Possible Post-op intubation/ventilation  Informed Consent: I have reviewed the patients History and Physical, chart, labs and discussed the procedure including the risks, benefits and alternatives for the proposed anesthesia with the patient or authorized representative who has indicated his/her understanding and acceptance.     Plan Discussed with: CRNA  Anesthesia Plan Comments:         Anesthesia Quick Evaluation
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
## 2011-05-12 NOTE — Progress Notes (Signed)
I agree with assessment and medication admin by Shary Key student.

## 2011-05-12 NOTE — Transfer of Care (Signed)
Immediate Anesthesia Transfer of Care Note  Patient: [name]  Procedure(s) Performed: Procedure(s) (LRB): OPEN REDUCTION INTERNAL FIXATION (ORIF) ACETABULAR FRACTURE (Left)  Patient Location: PACU  Anesthesia Type: General  Level of Consciousness: sedated and patient cooperative  Airway & Oxygen Therapy: Patient Spontanous Breathing and Patient connected to face mask oxygen  Post-op Assessment: Report given to PACU RN, Post -op Vital signs reviewed and stable and Patient moving all extremities X 4  Post vital signs: Reviewed and stable  Complications: No apparent anesthesia complications

## 2011-05-13 ENCOUNTER — Encounter: Payer: Self-pay | Admitting: Radiation Oncology

## 2011-05-13 ENCOUNTER — Encounter (HOSPITAL_COMMUNITY): Payer: Self-pay | Admitting: Orthopedic Surgery

## 2011-05-13 ENCOUNTER — Ambulatory Visit
Admit: 2011-05-13 | Discharge: 2011-05-13 | Disposition: A | Discharge status: Home / Self Care | Payer: No Typology Code available for payment source | Attending: Radiation Oncology | Admitting: Radiation Oncology

## 2011-05-13 DIAGNOSIS — M9689 Other intraoperative and postprocedural complications and disorders of the musculoskeletal system: Secondary | ICD-10-CM

## 2011-05-13 DIAGNOSIS — S32810A Multiple fractures of pelvis with stable disruption of pelvic ring, initial encounter for closed fracture: Secondary | ICD-10-CM

## 2011-05-13 DIAGNOSIS — I4891 Unspecified atrial fibrillation: Secondary | ICD-10-CM

## 2011-05-13 LAB — BASIC METABOLIC PANEL
BUN: 13 mg/dL (ref 6–23)
CO2: 26 mEq/L (ref 19–32)
Chloride: 99 mEq/L (ref 96–112)
Creatinine, Ser: 0.78 mg/dL (ref 0.50–1.35)
GFR calc Af Amer: >90 mL/min (ref >90)
Glucose, Bld: 104 mg/dL — ABNORMAL HIGH (ref 70–99)
Potassium: 4 mEq/L (ref 3.5–5.1)

## 2011-05-13 LAB — CBC
HCT: 28.6 % — ABNORMAL LOW (ref 39.0–52.0)
Hemoglobin: 9.6 g/dL — ABNORMAL LOW (ref 13.0–17.0)
MCV: 88 fL (ref 78.0–100.0)
RBC: 3.25 MIL/uL — ABNORMAL LOW (ref 4.22–5.81)
WBC: 13 10*3/uL — ABNORMAL HIGH (ref 4.0–10.5)

## 2011-05-13 LAB — TYPE AND SCREEN: Unit division: 0

## 2011-05-13 LAB — POCT I-STAT 4, (NA,K, GLUC, HGB,HCT)
Glucose, Bld: 114 mg/dL — ABNORMAL HIGH (ref 70–99)
HCT: 27 % — ABNORMAL LOW (ref 39.0–52.0)
Hemoglobin: 9.2 g/dL — ABNORMAL LOW (ref 13.0–17.0)
Potassium: 4.1 mEq/L (ref 3.5–5.1)
Sodium: 138 mEq/L (ref 135–145)

## 2011-05-13 LAB — PROTIME-INR: Prothrombin Time: 17.2 seconds — ABNORMAL HIGH (ref 11.6–15.2)

## 2011-05-13 MED ORDER — COUMADIN BOOK
Freq: Once | Status: AC
  Administered 2011-05-13: 15:00:00
  Filled 2011-05-13: qty 1

## 2011-05-13 MED ORDER — ENSURE COMPLETE PO LIQD
237.0000 mL | Freq: Two times a day (BID) | ORAL | Status: DC
  Administered 2011-05-14 – 2011-05-19 (×9): 237 mL via ORAL

## 2011-05-13 MED ORDER — HYDROMORPHONE 0.3 MG/ML IV SOLN
INTRAVENOUS | Status: AC
  Administered 2011-05-13: 7.5 mg
  Filled 2011-05-13: qty 25

## 2011-05-13 MED ORDER — HYDROMORPHONE 0.3 MG/ML IV SOLN
INTRAVENOUS | Status: AC
  Administered 2011-05-13: 1.5 mg
  Filled 2011-05-13: qty 25

## 2011-05-13 MED ORDER — HYDROMORPHONE 0.3 MG/ML IV SOLN
INTRAVENOUS | Status: AC
  Filled 2011-05-13: qty 25

## 2011-05-13 MED ORDER — WARFARIN - PHARMACIST DOSING INPATIENT
Freq: Every day | Status: DC
  Administered 2011-05-16: 18:00:00

## 2011-05-13 MED ORDER — WARFARIN SODIUM 5 MG PO TABS
5.0000 mg | ORAL_TABLET | Freq: Once | ORAL | Status: AC
  Administered 2011-05-13: 5 mg via ORAL
  Filled 2011-05-13: qty 1

## 2011-05-13 MED ORDER — WARFARIN VIDEO
Freq: Once | Status: AC
  Administered 2011-05-13: 21:00:00

## 2011-05-13 MED ORDER — HYDROMORPHONE HCL PF 1 MG/ML IJ SOLN
1.0000 mg | INTRAMUSCULAR | Status: DC | PRN
  Administered 2011-05-14: 1 mg via INTRAVENOUS
  Filled 2011-05-13: qty 1

## 2011-05-13 NOTE — Progress Notes (Signed)
[facility] notified patient was ready for pick up. Called report to [employer], Charity fundraiser. Emptied 1000 cc of amber urine from collection bag. Patient resting supine with HOB slightly elevated. Patient is alert and oriented to person, place, and time. No distress noted. Sinus tach noted. Patient reports left hip pain 4 on a scale of 0-10. Will continue to monitor until [facility] arrives.
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
## 2011-05-13 NOTE — Progress Notes (Addendum)
Nutrition Follow-up  Pt s/p open reduction and internal fixation of the T-type acetabular fracture with posterior wall, repair of left hip abductors 4/15. Currently not in room; left for XRT for HO prophylaxis at [facility]. PO intake at 90-100% per flowsheet records 4/14. No % intake for 4/15 or breakfast this AM.  Diet Order:  Full Liquids  Meds: Scheduled Meds:   .  ceFAZolin (ANCEF) IV  1 g Intravenous Q8H  .  ceFAZolin (ANCEF) IV  2 g Intravenous Once  . diltiazem  30 mg Oral Q6H  . enoxaparin  30 mg Subcutaneous Q12H  . HYDROmorphone PCA 0.3 mg/mL   Intravenous Q4H  . HYDROmorphone PCA 0.3 mg/mL      . HYDROmorphone PCA 0.3 mg/mL      . methocarbamol  500 mg Oral QID   Or  . methocarbamol (ROBAXIN) IV  500 mg Intravenous QID  . off the beat book   Does not apply Once  . pantoprazole  40 mg Oral Q1200  . senna  1 tablet Oral QHS  . DISCONTD:  ceFAZolin (ANCEF) IV  2 g Intravenous Once  . DISCONTD: morphine   Intravenous Q4H   Continuous Infusions:   . 0.9 % NaCl with KCl 20 mEq / L 50 mL/hr at 05/13/11 0052  . DISCONTD: sodium chloride    . DISCONTD: lactated ringers     PRN Meds:.bisacodyl, diphenhydrAMINE, diphenhydrAMINE, diphenhydrAMINE, magnesium hydroxide, metoCLOPramide (REGLAN) injection, metoCLOPramide, naloxone, ondansetron (ZOFRAN) IV, ondansetron (ZOFRAN) IV, ondansetron, oxyCODONE, sodium chloride, DISCONTD: HYDROmorphone, DISCONTD: metoCLOPramide (REGLAN) injection, DISCONTD: metoCLOPramide, DISCONTD: morphine, DISCONTD: ondansetron, DISCONTD: thrombin  Labs:  CMP     Component Value Date/Time   NA 134* 05/13/2011 0539   K 4.0 05/13/2011 0539   CL 99 05/13/2011 0539   CO2 26 05/13/2011 0539   GLUCOSE 104* 05/13/2011 0539   BUN 13 05/13/2011 0539   CREATININE 0.78 05/13/2011 0539   CALCIUM 7.6* 05/13/2011 0539   PROT 4.6* 05/09/2011 0538   ALBUMIN 2.0* 05/09/2011 0538   AST 97* 05/09/2011 0538   ALT 43 05/09/2011 0538   ALKPHOS 47 05/09/2011 0538   BILITOT 1.0 05/09/2011  0538   GFRNONAA >90 05/13/2011 0539   GFRAA >90 05/13/2011 0539     Intake/Output Summary (Last 24 hours) at 05/13/11 1048 Last data filed at 05/12/11 2130  Gross per 24 hour  Intake   5200 ml  Output   3000 ml  Net   2200 ml    Weight Status:  108.8 kg (4/11) -- no new weight available  Re-estimated needs:  2200-2400 kcals, 110-120 gm protein  Nutrition Dx:  Inadequate Oral Intake, ongoing  Goal:  Oral intake to meet >90% of estimated nutrition needs, unmet Monitor: PO intake, labs, weight, I/O's  Intervention:    Ensure Complete PO BID (350 kcals, 13 gm protein per 8 fl oz bottle)  RD to follow for nutrition care plan   Alger Memos Pager #:  [phone]

## 2011-05-13 NOTE — Progress Notes (Signed)
[facility] Radiation Oncology End of Treatment Note  Name:[name]  Date: 05/13/2011 ZOX:[id number] DOB:07-23-1960   Status:inpatient    CC: Dr. Myrene Galas   REFERRING PHYSICIAN: Dr. Myrene Galas     DIAGNOSIS: Left acetabular fracture   INDICATION FOR TREATMENT: Prevention of heterotopic bone formation   TREATMENT DATE: 05/13/2011                          SITE/DOSE:    Left pelvis/acetabulum, 7000 cGy single fraction                        BEAMS/ENERGY:    18 MV photons parallel post anterior posterior field               NARRATIVE:  The patient tolerated treatment well                          PLAN: Followup through Dr. Myrene Galas.
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
## 2011-05-13 NOTE — Progress Notes (Signed)
Simulation/treatment planning note:  The patient was taken to the CT simulator. He was placed supine. An isocenter was chosen along the left hip. He was scanned. He was setup to AP and PA fields. 2 separate multileaf collimators were designed to conform the field. I prescribed 700 cGy in a single fraction utilizing 18 MV photons. Dosimetry an isodose plan were requested.

## 2011-05-13 NOTE — Progress Notes (Signed)
PT Cancellation Note  Treatment cancelled today due to PA's note indicating PT/OT to start tomorrow 05-14-11.  Pt. scheduled for XRT at [facility] today.  PT/OT to check back tomorrow.  Lurena Joiner B. Aidenn Skellenger, PT, DPT [phone] 05/13/2011, 9:06 AM

## 2011-05-13 NOTE — Progress Notes (Signed)
Orthopedic Tech Progress Note Patient Details:  [name] 1960-03-04 [id number]  Other Ortho Devices Type of Ortho Device: Other (comment) (prafo boot) Ortho Device Location: (L) LE Ortho Device Interventions: Application   [name] 05/13/2011, 3:45 PM

## 2011-05-13 NOTE — Progress Notes (Signed)
Patient resting in bed supine with HOB elevated. Patient is alert and oriented to person, place, and time. No distress noted. Pleasant affect noted. Patient sipping water. Patient fever resolved on its own. Patient reports left hip pain 4 down from 8 on a scale of 0-10. Sinus tach noted. Patient has no complaints at this time. Call bell within reach. Will continue to monitor patient. Encouraged to call with needs. Patient verbalized understanding.
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
## 2011-05-13 NOTE — Progress Notes (Signed)
Weekly Management Note:  Site:L Hip/pelvis Current Dose:  700  cGy Projected Dose: 700  cGy  Narrative: The patient is seen today for routine under treatment assessment. CBCT/MVCT images/port films were reviewed. The chart was reviewed.   No complaints.  Physical Examination: There were no vitals filed for this visit..  Weight:  . No change.  Impression: Tolerated radiation therapy well.  Plan: Radiation therapy completed. Followup with Dr. Casimiro Needle handy.
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
## 2011-05-13 NOTE — Progress Notes (Signed)
  Echocardiogram 2D Echocardiogram has been performed.  Debria Broecker L 05/13/2011, 3:29 PM

## 2011-05-13 NOTE — Progress Notes (Signed)
Received patient in the clinic following CT/SIM for monitoring while awaiting treatment plan completion. Patient resting supine with HOB slightly elevated. Patient is alert and oriented to person, place, and time. No distress noted. Sinus tach noted. Patient reports left hip pain 8 on a scale of 0-10. Unable to clear PCA alarm. Powered system completely down. Cleared 2.53 cc of Dilaudid from pump. Dilaudid and all other infusions resumed. Loading dose of 0.5 cc given at 1218. Earlier temperature of 101 reported to Dr. Chipper Herb. Dr. Dayton Scrape does not wish to give Tylenol for fever at this time. Reported all occurrences to [place], Charity fundraiser.

## 2011-05-14 LAB — CBC
Hemoglobin: 9.2 g/dL — ABNORMAL LOW (ref 13.0–17.0)
MCH: 30 pg (ref 26.0–34.0)
MCHC: 33.8 g/dL (ref 30.0–36.0)
MCV: 88.6 fL (ref 78.0–100.0)
RBC: 3.07 MIL/uL — ABNORMAL LOW (ref 4.22–5.81)

## 2011-05-14 LAB — URINALYSIS, ROUTINE W REFLEX MICROSCOPIC
Glucose, UA: NEGATIVE mg/dL
Ketones, ur: NEGATIVE mg/dL
Protein, ur: NEGATIVE mg/dL
Urobilinogen, UA: 4 mg/dL — ABNORMAL HIGH (ref 0.0–1.0)

## 2011-05-14 LAB — BASIC METABOLIC PANEL
CO2: 25 mEq/L (ref 19–32)
Calcium: 7.6 mg/dL — ABNORMAL LOW (ref 8.4–10.5)
Creatinine, Ser: 0.66 mg/dL (ref 0.50–1.35)
GFR calc non Af Amer: >90 mL/min (ref >90)
Glucose, Bld: 114 mg/dL — ABNORMAL HIGH (ref 70–99)
Sodium: 132 mEq/L — ABNORMAL LOW (ref 135–145)

## 2011-05-14 LAB — HEPATIC FUNCTION PANEL
ALT: 46 U/L (ref 0–53)
AST: 65 U/L — ABNORMAL HIGH (ref 0–37)
Alkaline Phosphatase: 84 U/L (ref 39–117)
Bilirubin, Direct: 5.2 mg/dL — ABNORMAL HIGH (ref 0.0–0.3)
Indirect Bilirubin: 1 mg/dL — ABNORMAL HIGH (ref 0.3–0.9)
Total Bilirubin: 6.2 mg/dL — ABNORMAL HIGH (ref 0.3–1.2)

## 2011-05-14 LAB — URINE MICROSCOPIC-ADD ON

## 2011-05-14 MED ORDER — ACETAMINOPHEN 325 MG PO TABS
325.0000 mg | ORAL_TABLET | Freq: Four times a day (QID) | ORAL | Status: DC | PRN
  Administered 2011-05-14: 650 mg via ORAL
  Filled 2011-05-14: qty 2

## 2011-05-14 MED ORDER — OXYCODONE HCL 5 MG PO TABS
5.0000 mg | ORAL_TABLET | ORAL | Status: DC | PRN
  Administered 2011-05-14: 10 mg via ORAL
  Administered 2011-05-14 (×2): 5 mg via ORAL
  Administered 2011-05-15 – 2011-05-16 (×8): 10 mg via ORAL
  Filled 2011-05-14 (×2): qty 2
  Filled 2011-05-14: qty 1
  Filled 2011-05-14 (×8): qty 2
  Filled 2011-05-14: qty 1

## 2011-05-14 MED ORDER — DILTIAZEM HCL ER COATED BEADS 120 MG PO CP24
120.0000 mg | ORAL_CAPSULE | Freq: Every day | ORAL | Status: DC
  Administered 2011-05-14 – 2011-05-20 (×7): 120 mg via ORAL
  Filled 2011-05-14 (×8): qty 1

## 2011-05-14 MED ORDER — PREGABALIN 75 MG PO CAPS
75.0000 mg | ORAL_CAPSULE | Freq: Two times a day (BID) | ORAL | Status: DC
  Administered 2011-05-14 – 2011-05-15 (×3): 75 mg via ORAL
  Filled 2011-05-14 (×3): qty 1

## 2011-05-14 MED ORDER — OXYCODONE-ACETAMINOPHEN 5-325 MG PO TABS
1.0000 | ORAL_TABLET | Freq: Four times a day (QID) | ORAL | Status: DC | PRN
  Administered 2011-05-15 (×2): 2 via ORAL
  Filled 2011-05-14 (×2): qty 2

## 2011-05-14 MED ORDER — HYDROMORPHONE HCL PF 1 MG/ML IJ SOLN
1.0000 mg | INTRAMUSCULAR | Status: DC | PRN
  Administered 2011-05-14: 1 mg via INTRAVENOUS
  Filled 2011-05-14 (×2): qty 1

## 2011-05-14 MED ORDER — WARFARIN SODIUM 3 MG PO TABS
3.0000 mg | ORAL_TABLET | Freq: Once | ORAL | Status: AC
  Administered 2011-05-14: 3 mg via ORAL
  Filled 2011-05-14: qty 1

## 2011-05-14 NOTE — Progress Notes (Signed)
SUBJECTIVE:  Significant pain following PT.  No palpitations or new SOB   PHYSICAL EXAM Filed Vitals:   05/14/11 0400 05/14/11 0613 05/14/11 1128 05/14/11 1331  BP: 134/68 117/59 110/78 131/64  Pulse: 100   101  Temp: 98.9 F (37.2 C)   101.5 F (38.6 C)  TempSrc:    Oral  Resp: 18   18  Height:      Weight:      SpO2: 92%   91%   General:  No distress Lungs:  Clear Heart:  RRR Abdomen:  Positive bowel sounds, no rebound no guarding Extremities:  No edema.  LABS: Lab Results  Component Value Date   CKTOTAL 2479* 05/08/2011   CKMB 3.1 05/08/2011   TROPONINI <0.30 05/08/2011   Results for orders placed during the hospital encounter of 05/05/11 (from the past 24 hour(s))  CBC     Status: Abnormal   Collection Time   05/14/11  5:35 AM      Component Value Range   WBC 17.7 (*) 4.0 - 10.5 (K/uL)   RBC 3.07 (*) 4.22 - 5.81 (MIL/uL)   Hemoglobin 9.2 (*) 13.0 - 17.0 (g/dL)   HCT 95.6 (*) 21.3 - 52.0 (%)   MCV 88.6  78.0 - 100.0 (fL)   MCH 30.0  26.0 - 34.0 (pg)   MCHC 33.8  30.0 - 36.0 (g/dL)   RDW 08.6  57.8 - 46.9 (%)   Platelets 287  150 - 400 (K/uL)  BASIC METABOLIC PANEL     Status: Abnormal   Collection Time   05/14/11  5:35 AM      Component Value Range   Sodium 132 (*) 135 - 145 (mEq/L)   Potassium 3.7  3.5 - 5.1 (mEq/L)   Chloride 96  96 - 112 (mEq/L)   CO2 25  19 - 32 (mEq/L)   Glucose, Bld 114 (*) 70 - 99 (mg/dL)   BUN 12  6 - 23 (mg/dL)   Creatinine, Ser 6.29  0.50 - 1.35 (mg/dL)   Calcium 7.6 (*) 8.4 - 10.5 (mg/dL)   GFR calc non Af Amer >90  >90 (mL/min)   GFR calc Af Amer >90  >90 (mL/min)  PROTIME-INR     Status: Abnormal   Collection Time   05/14/11  5:35 AM      Component Value Range   Prothrombin Time 18.5 (*) 11.6 - 15.2 (seconds)   INR 1.51 (*) 0.00 - 1.49   HEPATIC FUNCTION PANEL     Status: Abnormal   Collection Time   05/14/11  5:35 AM      Component Value Range   Total Protein 4.5 (*) 6.0 - 8.3 (g/dL)   Albumin 1.4 (*) 3.5 - 5.2 (g/dL)    AST 65 (*) 0 - 37 (U/L)   ALT 46  0 - 53 (U/L)   Alkaline Phosphatase 84  39 - 117 (U/L)   Total Bilirubin 6.2 (*) 0.3 - 1.2 (mg/dL)   Bilirubin, Direct 5.2 (*) 0.0 - 0.3 (mg/dL)   Indirect Bilirubin 1.0 (*) 0.3 - 0.9 (mg/dL)  OSMOLALITY     Status: Abnormal   Collection Time   05/14/11 10:51 AM      Component Value Range   Osmolality 273 (*) 275 - 300 (mOsm/kg)    Intake/Output Summary (Last 24 hours) at 05/14/11 1556 Last data filed at 05/14/11 1300  Gross per 24 hour  Intake    840 ml  Output   5150 ml  Net  -  4310 ml    ASSESSMENT AND PLAN:  Atrial fibrillation:  No further arrhythmia.  No indication for telemetry.  Echo essentially unremarkable.  No cardiac indication for long term anticoagulation.  Continue Cardizem CD.  Please call us with further questions.    Rollene Rotunda 05/14/2011 3:56 PM

## 2011-05-14 NOTE — Progress Notes (Signed)
Pt temp elevated to 101.5. 2 tylenol given. Montez Morita, PA notified of spike in temperature and increase in WBC. NO new orders obtained. Will recheck temp in a hour and continue to monitor patient closely. Ramond Craver, RN
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
## 2011-05-14 NOTE — Plan of Care (Signed)
Problem: Phase I Progression Outcomes Goal: OOB as tolerated unless otherwise ordered Outcome: Not Progressing Due to severe pain LLE. RN and PA aware.
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
## 2011-05-14 NOTE — Progress Notes (Signed)
I saw and examined the patient today with findings as above.
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
## 2011-05-14 NOTE — Progress Notes (Signed)
Patient transferred via bed to [id number]. Patient stable and all belongings and wife present at transfer. Report had been called by day shift RN. Updates given to receiving RN. Pt tolerated transfer well.

## 2011-05-15 LAB — BASIC METABOLIC PANEL
CO2: 25 mEq/L (ref 19–32)
Calcium: 7.8 mg/dL — ABNORMAL LOW (ref 8.4–10.5)
Creatinine, Ser: 0.6 mg/dL (ref 0.50–1.35)
GFR calc non Af Amer: >90 mL/min (ref >90)
Glucose, Bld: 108 mg/dL — ABNORMAL HIGH (ref 70–99)

## 2011-05-15 LAB — URINE CULTURE
Colony Count: NO GROWTH
Culture: NO GROWTH

## 2011-05-15 LAB — CBC
MCH: 29.5 pg (ref 26.0–34.0)
MCHC: 33.1 g/dL (ref 30.0–36.0)
MCV: 89.1 fL (ref 78.0–100.0)
Platelets: 373 10*3/uL (ref 150–400)
RBC: 2.75 MIL/uL — ABNORMAL LOW (ref 4.22–5.81)

## 2011-05-15 LAB — PROTIME-INR: Prothrombin Time: 19.5 seconds — ABNORMAL HIGH (ref 11.6–15.2)

## 2011-05-15 MED ORDER — METHOCARBAMOL 500 MG PO TABS
500.0000 mg | ORAL_TABLET | Freq: Four times a day (QID) | ORAL | Status: DC
  Administered 2011-05-15: 1000 mg via ORAL
  Administered 2011-05-15 – 2011-05-16 (×4): 500 mg via ORAL
  Administered 2011-05-16 (×2): 1000 mg via ORAL
  Administered 2011-05-17: 500 mg via ORAL
  Administered 2011-05-17: 1000 mg via ORAL
  Administered 2011-05-17 (×2): 500 mg via ORAL
  Administered 2011-05-18 (×2): 1000 mg via ORAL
  Administered 2011-05-18 (×2): 500 mg via ORAL
  Administered 2011-05-19 (×4): 1000 mg via ORAL
  Administered 2011-05-20: 500 mg via ORAL
  Administered 2011-05-20: 1000 mg via ORAL
  Filled 2011-05-15 (×2): qty 2
  Filled 2011-05-15: qty 1
  Filled 2011-05-15 (×2): qty 2
  Filled 2011-05-15: qty 1
  Filled 2011-05-15 (×10): qty 2
  Filled 2011-05-15 (×2): qty 1
  Filled 2011-05-15 (×2): qty 2
  Filled 2011-05-15: qty 1
  Filled 2011-05-15 (×6): qty 2

## 2011-05-15 MED ORDER — FERROUS SULFATE 325 (65 FE) MG PO TABS
325.0000 mg | ORAL_TABLET | Freq: Three times a day (TID) | ORAL | Status: DC
  Administered 2011-05-15 – 2011-05-20 (×13): 325 mg via ORAL
  Filled 2011-05-15 (×17): qty 1

## 2011-05-15 MED ORDER — METHOCARBAMOL 100 MG/ML IJ SOLN
500.0000 mg | Freq: Four times a day (QID) | INTRAVENOUS | Status: DC
  Filled 2011-05-15 (×23): qty 5

## 2011-05-15 MED ORDER — WARFARIN SODIUM 3 MG PO TABS
3.0000 mg | ORAL_TABLET | Freq: Once | ORAL | Status: AC
  Administered 2011-05-15: 3 mg via ORAL
  Filled 2011-05-15: qty 1

## 2011-05-15 MED ORDER — PREGABALIN 75 MG PO CAPS
75.0000 mg | ORAL_CAPSULE | Freq: Three times a day (TID) | ORAL | Status: DC
  Administered 2011-05-15 (×2): 75 mg via ORAL
  Filled 2011-05-15: qty 1
  Filled 2011-05-15: qty 3

## 2011-05-15 NOTE — Progress Notes (Signed)
Physical Therapy Treatment Patient Details Name: [name] MRN: [id number] DOB: 1961-01-18 Today's Date: 05/15/2011  PT Assessment/Plan  PT - Assessment/Plan Comments on Treatment Session: Although pt made it further toward the EOB and participated more (mostly with the upper body/UE's on the rail or trapeze), pt was not able to get his L LE off the right side of the bed to a sitting position due to pain that he is unable to bear.  Many different facilitaory treatment techniques tried to get him toward the EOB without success PT Plan: Discharge plan remains appropriate Follow Up Recommendations: Inpatient Rehab Equipment Recommended: Defer to next venue PT Goals  Acute Rehab PT Goals PT Goal: Supine/Side to Sit - Progress: Not progressing PT Goal: Sit at Edge Of Bed - Progress: Not progressing PT Goal: Sit to Supine/Side - Progress: Not progressing  PT Treatment Precautions/Restrictions  Precautions Precautions: Posterior Hip;Fall Precaution Booklet Issued: No Required Braces or Orthoses: Other Brace/Splint Other Brace/Splint: PRAFO Restrictions Weight Bearing Restrictions: Yes LUE Weight Bearing: Non weight bearing LLE Weight Bearing: Non weight bearing Other Position/Activity Restrictions: Attempted to get the L LE above the heart as a rest position without success due to pain Mobility (including Balance) Bed Mobility Bed Mobility: Yes Supine to Sit: 1: +2 Total assist;Patient percentage (comment);Other (comment) (pt =30%) Supine to Sit Details (indicate cue type and reason): Pt made it virtually to full sit with maximal assist from 2 persons.  Like on eval, the pt was unable to tolerate his L LE out of a rest position lying on the bed, so we never attained sitting EOB with dangle or support of L LE.  Extensive facilatory techniques were attempted without success Sitting - Scoot to Edge of Bed: 1: +2 Total assist;Patient percentage (comment) (Pt =<30%) Sitting - Scoot to  Edge of Bed Details (indicate cue type and reason): Unable to make it to EOB due to pain or the anticipation of more pain Sit to Supine: 1: +2 Total assist;Patient percentage (comment);Other (comment) (pt= 30%) Transfers Transfers: No Ambulation/Gait Ambulation/Gait: No Stairs: No Wheelchair Mobility Wheelchair Mobility: No  Posture/Postural Control Posture/Postural Control:  (unable to check at this point) Balance Balance Assessed: No Exercise    End of Session PT - End of Session Equipment Utilized During Treatment: Left ankle foot orthosis Activity Tolerance: Patient limited by pain Patient left: in bed;with call bell in reach;with family/visitor present Nurse Communication: Other (comment) (general mobility limitation) General Behavior During Session: [facility] for tasks performed Cognition: [facility] for tasks performed  Renad Jenniges, Eliseo Gum 05/15/2011, 3:51 PM
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
## 2011-05-16 LAB — BASIC METABOLIC PANEL
Chloride: 100 mEq/L (ref 96–112)
Creatinine, Ser: 0.71 mg/dL (ref 0.50–1.35)
GFR calc Af Amer: >90 mL/min (ref >90)
Potassium: 3.7 mEq/L (ref 3.5–5.1)

## 2011-05-16 LAB — PROTIME-INR: INR: 1.38 (ref 0.00–1.49)

## 2011-05-16 LAB — CBC
Platelets: 403 10*3/uL — ABNORMAL HIGH (ref 150–400)
RDW: 15 % (ref 11.5–15.5)
WBC: 14 10*3/uL — ABNORMAL HIGH (ref 4.0–10.5)

## 2011-05-16 MED ORDER — VITAMIN C 500 MG PO TABS
500.0000 mg | ORAL_TABLET | Freq: Every day | ORAL | Status: DC
  Administered 2011-05-16 – 2011-05-20 (×5): 500 mg via ORAL
  Filled 2011-05-16 (×5): qty 1

## 2011-05-16 MED ORDER — OXYCODONE-ACETAMINOPHEN 5-325 MG PO TABS
1.0000 | ORAL_TABLET | Freq: Four times a day (QID) | ORAL | Status: DC | PRN
  Administered 2011-05-18 – 2011-05-20 (×7): 2 via ORAL
  Filled 2011-05-16 (×7): qty 2

## 2011-05-16 MED ORDER — OXYCODONE HCL 5 MG PO TABS
10.0000 mg | ORAL_TABLET | ORAL | Status: DC | PRN
  Administered 2011-05-16 – 2011-05-20 (×22): 10 mg via ORAL
  Filled 2011-05-16 (×8): qty 2
  Filled 2011-05-16: qty 1
  Filled 2011-05-16 (×4): qty 2
  Filled 2011-05-16: qty 1
  Filled 2011-05-16 (×4): qty 2

## 2011-05-16 MED ORDER — OXYCODONE HCL 5 MG PO TABS
5.0000 mg | ORAL_TABLET | Freq: Four times a day (QID) | ORAL | Status: DC | PRN
  Administered 2011-05-16: 10 mg via ORAL
  Filled 2011-05-16 (×2): qty 2

## 2011-05-16 MED ORDER — OXYCODONE HCL 5 MG PO TABS
5.0000 mg | ORAL_TABLET | Freq: Four times a day (QID) | ORAL | Status: DC | PRN
  Administered 2011-05-17 – 2011-05-19 (×4): 10 mg via ORAL
  Administered 2011-05-19: 5 mg via ORAL
  Administered 2011-05-20: 10 mg via ORAL
  Filled 2011-05-16 (×10): qty 2

## 2011-05-16 MED ORDER — WARFARIN SODIUM 5 MG PO TABS
5.0000 mg | ORAL_TABLET | Freq: Once | ORAL | Status: AC
  Administered 2011-05-16: 5 mg via ORAL
  Filled 2011-05-16: qty 1

## 2011-05-16 MED ORDER — PREGABALIN 50 MG PO CAPS
100.0000 mg | ORAL_CAPSULE | Freq: Three times a day (TID) | ORAL | Status: DC
  Administered 2011-05-16 – 2011-05-20 (×13): 100 mg via ORAL
  Filled 2011-05-16: qty 4
  Filled 2011-05-16 (×4): qty 2
  Filled 2011-05-16: qty 4
  Filled 2011-05-16 (×7): qty 2

## 2011-05-16 NOTE — Progress Notes (Signed)
ANTICOAGULATION CONSULT NOTE - Follow Up Consult  Pharmacy Consult for Coumadin Indication: VTE prophylaxis  No Known Allergies  Patient Measurements: Height: 5\' 9"  (175.3 cm) Weight: 240 lb (108.863 kg) IBW/kg (Calculated) : 70.7   Vital Signs: Temp: 97.1 F (36.2 C) (04/19 0608) BP: 136/86 mmHg (04/19 0608) Pulse Rate: 78  (04/19 0608)  Labs:  Basename 05/16/11 0537 05/15/11 0521 05/14/11 0535  HGB 8.8* 8.1* --  HCT 26.8* 24.5* 27.2*  PLT 403* 373 287  APTT -- -- --  LABPROT 17.2* 19.5* 18.5*  INR 1.38 1.62* 1.51*  HEPARINUNFRC -- -- --  CREATININE 0.71 0.60 0.66  CKTOTAL -- -- --  CKMB -- -- --  TROPONINI -- -- --   Estimated Creatinine Clearance: 135.9 ml/min (by C-G formula based on Cr of 0.71).   Medications:  Scheduled:    . diltiazem  120 mg Oral Daily  . enoxaparin  30 mg Subcutaneous Q12H  . feeding supplement  237 mL Oral BID BM  . ferrous sulfate  325 mg Oral TID WC  . methocarbamol  500-1,000 mg Oral QID   Or  . methocarbamol (ROBAXIN) IV  500 mg Intravenous QID  . off the beat book   Does not apply Once  . pantoprazole  40 mg Oral Q1200  . pregabalin  100 mg Oral TID  . senna  1 tablet Oral QHS  . vitamin C  500 mg Oral Daily  . warfarin  3 mg Oral ONCE-1800  . Warfarin - Pharmacist Dosing Inpatient   Does not apply q1800  . DISCONTD: pregabalin  75 mg Oral TID    Assessment: 51yo male s/p L-ORIF, on Coumadin for VTE px.  INR down to 1.38 this AM, no bleeding reported.  Have been dosing cautiously given increased INR & LFTs at baseline.  Will increase dose today  Goal of Therapy:  INR 2-3   Plan:  1.  Coumadin 5mg  today 2.  Continue Lovenox 30mg  SQ q12 3.  F/U in AM  Marisue Humble, PharmD Clinical Pharmacist [facility]

## 2011-05-16 NOTE — Progress Notes (Signed)
Orthopedic Tech Progress Note Patient Details:  [name] Jun 26, 1960 [id number]  Patient ID: EVERARDO VORIS, male   DOB: Mar 27, 1960, 51 y.o.   MRN: [id number] Contacted [facility] for brace order. Leo Grosser T 05/16/2011, 12:58 PM

## 2011-05-16 NOTE — Progress Notes (Signed)
Physical Therapy Treatment Patient Details Name: [name] MRN: [id number] DOB: 1960/05/06 Today's Date: 05/16/2011 Time: 9562-1308 PT Time Calculation (min): 61 min  PT Assessment / Plan / Recommendation Comments on Treatment Session  Pt made it to the EOB with L LE at 90 degrees.  He was able to ultimately sit EOB with UE assist  with much decr pain.  Pt was not able to get himself to fully participate in the sit to stand due to anxiety primarily    Follow Up Recommendations  Inpatient Rehab    Equipment Recommendations  Defer to next venue    Frequency     Plan Discharge plan remains appropriate    Precautions / Restrictions Precautions Precautions: Posterior Hip;Fall Required Braces or Orthoses: Other Brace/Splint Other Brace/Splint: PRAFO Restrictions Weight Bearing Restrictions: Yes LLE Weight Bearing: Touchdown weight bearing   Pertinent Vitals/Pain     Mobility  Bed Mobility Bed Mobility: Rolling Right;Supine to Sit;Sitting - Scoot to [place] of Bed;Sit to Supine Rolling Right: 1: +2 Total assist;Other (comment) (pt =50 % half roll) Rolling Right: Patient Percentage: 50% Supine to Sit: 1: +2 Total assist Supine to Sit: Patient Percentage: 50% Sitting - Scoot to Edge of Bed: 2: Max assist Sit to Supine: 1: +2 Total assist;HOB flat Sit to Supine: Patient Percentage: 30% Scooting to HOB: 3: Mod assist Details for Bed Mobility Assistance: extensive time and facilitation working to attain upright posture sitting EOB.  Pt able to bridge with assist today and vc for technique. Transfers Transfers: Sit to Stand Sit to Stand: 1: +2 Total assist;From elevated surface;With upper extremity assist;From bed;Other (comment) (to maximal face to face  one person assist) Sit to Stand: Patient Percentage: 40% Details for Transfer Assistance: attained partial stand times 2, enough to readjust the pad, but patient shuts down his assist when pain comes on or he gets anxious of a new  task. Ambulation/Gait Stairs: No    Exercises     PT Goals Acute Rehab PT Goals PT Goal: Supine/Side to Sit - Progress: Progressing toward goal PT Goal: Sit at Edge Of Bed - Progress: Progressing toward goal PT Goal: Sit to Supine/Side - Progress: Progressing toward goal PT Goal: Sit to Stand - Progress: Not progressing PT Goal: Stand to Sit - Progress: Not progressing Additional Goals Additional Goal #1: Pt able to verbalize hip precautions and able to direct caregivers in how to assist/move his LLE to maintain precautions.  Visit Information  Last PT Received On: 05/16/11 Assistance Needed: +2    Subjective Data  Subjective: The pain is bad mostly at my L shin, but much more manageable Patient Stated Goal: get to rehab   Cognition  Overall Cognitive Status: Appears within functional limits for tasks assessed/performed Arousal/Alertness: Awake/alert Orientation Level: Oriented X4 / Intact Behavior During Session: [facility] for tasks performed    Balance  Balance Balance Assessed: Yes Static Sitting Balance Static Sitting - Balance Support: Right upper extremity supported;Left upper extremity supported;No upper extremity supported;Feet supported Static Sitting - Level of Assistance: 5: Stand by assistance Static Sitting - Comment/# of Minutes: 20  End of Session PT - End of Session Equipment Utilized During Treatment: Left ankle foot orthosis Activity Tolerance: Patient limited by pain Patient left: in bed;with call bell/phone within reach;with family/visitor present Nurse Communication: Mobility status    Koralyn Prestage, Eliseo Gum 05/16/2011, 2:49 PM  05/16/2011  Adrian Bing, PT [phone] (pager)

## 2011-05-16 NOTE — Progress Notes (Signed)
Subjective: 4 Days Post-Op Procedure(s) (LRB): OPEN REDUCTION INTERNAL FIXATION (ORIF) ACETABULAR FRACTURE (Left) Patient doing better this morning Pain is gradually improving Did work with therapy yesterday but still slow to progress Tolerated regular food this morning Denies any new numbness or tingling. Does note occasional shooting electric pain in his left leg  Objective: Current Vitals Blood pressure 136/86, pulse 78, temperature 97.1 F (36.2 C), temperature source Oral, resp. rate 22, height 5\' 9"  (1.753 m), weight 108.863 kg (240 lb), SpO2 94.00%. Vital signs in last 24 hours: Temp:  [97.1 F (36.2 C)-99.6 F (37.6 C)] 97.1 F (36.2 C) (04/19 1610) Pulse Rate:  [78-93] 78  (04/19 0608) Resp:  [19-22] 22  (04/19 0608) BP: (126-136)/(66-86) 136/86 mmHg (04/19 0608) SpO2:  [92 %-94 %] 94 % (04/19 0608) Intake/Output      04/18 0701 - 04/19 0700 04/19 0701 - 04/20 0700   P.O. 960    Total Intake(mL/kg) 960 (8.8)    Urine (mL/kg/hr) 6600 (2.5)    Total Output 6600    Net -5640            Intake/Output from previous day: 04/18 0701 - 04/19 0700 In: 960 [P.O.:960] Out: 6600 [Urine:6600]  LABS  Basename 05/16/11 0537 05/15/11 0521 05/14/11 0535  HGB 8.8* 8.1* 9.2*    Basename 05/16/11 0537 05/15/11 0521  WBC 14.0* 18.1*  RBC 2.97* 2.75*  HCT 26.8* 24.5*  PLT 403* 373    Basename 05/16/11 0537 05/15/11 0521  NA 135 132*  K 3.7 3.5  CL 100 97  CO2 27 25  BUN 12 12  CREATININE 0.71 0.60  GLUCOSE 136* 108*  CALCIUM 7.9* 7.8*    Basename 05/16/11 0537 05/15/11 0521  LABPT -- --  INR 1.38 1.62*    Physical Exam  Gen: Awake, alert, no acute distress Lungs: Clear Cardiac: S1 and S2 Abd: Positive bowel sounds, nontender Pelvis: Dressing stable, clean and intact Ext: Left lower extremity  PRAFO fitting well  Wounds are stable  DPN, SPN, TM sensation is intact  Toe flexion intact, ankle flexion intact  No ankle extension or toe extension  appreciated  No appreciable eversion noted  Extremity is warm, palpable dorsalis pedis pulse    Imaging No results found.  Assessment/Plan: 4 Days Post-Op Procedure(s) (LRB): OPEN REDUCTION INTERNAL FIXATION (ORIF) ACETABULAR FRACTURE (Left)  51 year old male status post MCA  1. Complex T-type left acetabulum fracture dislocation  Posterior hip precautions  PT/OT  Bed to chair  Toe-touch weightbearing to facilitate transfers  Continue to monitor sciatic nerve (peroneal division)  Dressing changes when necessary  Continue with ice as needed  2. Open left tibia fracture status post ORIF  Dressing changes as needed  No range of motion restrictions the left knee and ankle  Will place patient in TED hose today 3. Peroneal nerve neurapraxia  Continue to monitor  Continue with PRAFO  Will continue to titrate up there is patient continues to have neuropathic pain, increase Lyrica to 100 mg by mouth 3 times a day  Continue with passive motion of the toes and ankle throughout the day 4. acute blood loss anemia  Improved H&H today, hold on transfusion  Continue with iron replacement  Continue to monitor 5. A. Fib  Resolved, normal sinus rhythm  Continue to monitor  Appreciated cards input 6. FEN  Diet as tolerated  Advance bowel regimen  Hypokalemia-resolved  Hyponatremia-hypotonic, euvolemic-resolved 7. DVT/PE prophylaxis  Continue with Lovenox bridge to Coumadin  Slight drop in  INR today  Continue to monitor and titrate per pharmacy 8. scrotal edema  Continue with Foley  Urology to see next week 9. Pain  Continue with by mouth medications  Will increase Percocet to Percocet 10/325 every 6 hours prn 10. Disposition  Continue with current therapies  Slowly progressing  Again we will make a decision on post acute care on Monday whether that be an inpatient rehabilitation consultation versus SNF versus discharge home with home health therapy  Again patient had  tremendous injury to the soft tissue and musculature around his left hip which will significantly impact his mobility level as well as his pain with mobilization.    Mearl Latin, PA-C [facility] [phone] (P) 05/16/2011, 9:12 AM

## 2011-05-17 LAB — CBC
MCH: 29.1 pg (ref 26.0–34.0)
Platelets: 418 10*3/uL — ABNORMAL HIGH (ref 150–400)
RBC: 2.99 MIL/uL — ABNORMAL LOW (ref 4.22–5.81)
WBC: 12.9 10*3/uL — ABNORMAL HIGH (ref 4.0–10.5)

## 2011-05-17 LAB — BASIC METABOLIC PANEL
CO2: 27 mEq/L (ref 19–32)
Calcium: 8 mg/dL — ABNORMAL LOW (ref 8.4–10.5)
Sodium: 134 mEq/L — ABNORMAL LOW (ref 135–145)

## 2011-05-17 LAB — PROTIME-INR: Prothrombin Time: 17.1 seconds — ABNORMAL HIGH (ref 11.6–15.2)

## 2011-05-17 MED ORDER — WARFARIN SODIUM 10 MG PO TABS
10.0000 mg | ORAL_TABLET | Freq: Once | ORAL | Status: AC
  Administered 2011-05-17: 10 mg via ORAL
  Filled 2011-05-17: qty 1

## 2011-05-17 NOTE — Progress Notes (Signed)
Patient refuses to have the bed alarm on. Patient verbalize understanding the purpose of the bed alarm.

## 2011-05-17 NOTE — Progress Notes (Signed)
ANTICOAGULATION CONSULT NOTE - Follow Up Consult  Pharmacy Consult for Coumadin Indication: VTE prophylaxis  No Known Allergies  Patient Measurements: Height: 5\' 9"  (175.3 cm) Weight: 240 lb (108.863 kg) IBW/kg (Calculated) : 70.7   Vital Signs: Temp: 98.1 F (36.7 C) (04/20 0610) BP: 122/60 mmHg (04/20 0610) Pulse Rate: 83  (04/20 0610)  Labs:  Basename 05/17/11 0525 05/16/11 0537 05/15/11 0521  HGB 8.7* 8.8* --  HCT 27.0* 26.8* 24.5*  PLT 418* 403* 373  APTT -- -- --  LABPROT 17.1* 17.2* 19.5*  INR 1.37 1.38 1.62*  HEPARINUNFRC -- -- --  CREATININE 0.76 0.71 0.60  CKTOTAL -- -- --  CKMB -- -- --  TROPONINI -- -- --   Estimated Creatinine Clearance: 135.9 ml/min (by C-G formula based on Cr of 0.76).   Medications:  Scheduled:    . diltiazem  120 mg Oral Daily  . enoxaparin  30 mg Subcutaneous Q12H  . feeding supplement  237 mL Oral BID BM  . ferrous sulfate  325 mg Oral TID WC  . methocarbamol  500-1,000 mg Oral QID   Or  . methocarbamol (ROBAXIN) IV  500 mg Intravenous QID  . off the beat book   Does not apply Once  . pantoprazole  40 mg Oral Q1200  . pregabalin  100 mg Oral TID  . senna  1 tablet Oral QHS  . vitamin C  500 mg Oral Daily  . warfarin  5 mg Oral ONCE-1800  . Warfarin - Pharmacist Dosing Inpatient   Does not apply q1800    Assessment: 51yo male on Coumadin/Lovenox for VTE px.  INR 1.37 this AM, unchanged & all doses charted.  No bleeding problems note  Goal of Therapy:  INR 2-3   Plan:  1.  Increase Coumadin to 10mg  today 2.  F/U in AM  Marisue Humble, PharmD Clinical Pharmacist [facility]

## 2011-05-17 NOTE — Progress Notes (Addendum)
Patient ID: CAIDON FOTI, male   DOB: 1960/03/18, 51 y.o.   MRN: [id number] PATIENT ID: TOSHIO SLUSHER        MRN:  [id number]          DOB/AGE: 01-18-1961 / 51 y.o.  [name], MD   [name], PA-C [address] [place], [place], [place]  [address]                             [phone]   PROGRESS NOTE  Subjective:  negative for Chest Pain  negative for Shortness of Breath  negative for Nausea/Vomiting   negative for Calf Pain  negative for Bowel Movement   Tolerating Diet: yes         Patient reports pain as moderate.    Objective: Vital signs in last 24 hours:   Patient Vitals for the past 24 hrs:  BP Temp Pulse Resp SpO2  05/17/11 0610 122/60 mmHg 98.1 F (36.7 C) 83  18  98 %  05/16/11 2202 124/66 mmHg 99.6 F (37.6 C) 92  19  97 %  05/16/11 1400 120/69 mmHg 100.7 F (38.2 C) 92  20  94 %      Intake/Output from previous day:   04/19 0701 - 04/20 0700 In: 1080 [P.O.:1080] Out: 6550 [Urine:6550]   Intake/Output this shift:       Intake/Output      04/19 0701 - 04/20 0700 04/20 0701 - 04/21 0700   P.O. 1080    Total Intake(mL/kg) 1080 (9.9)    Urine (mL/kg/hr) 6550 (2.5)    Total Output 6550    Net -5470            LABORATORY DATA:  Basename 05/17/11 0525 05/16/11 0537 05/15/11 0521 05/14/11 0535 05/13/11 0539 05/12/11 1814 05/12/11 1544 05/12/11 0635 05/11/11 0650  WBC 12.9* 14.0* 18.1* 17.7* 13.0* -- -- 12.6* 13.2*  HGB 8.7* 8.8* 8.1* 9.2* 9.6* 9.5* 9.2* -- --  HCT 27.0* 26.8* 24.5* 27.2* 28.6* 28.0* 27.0* -- --  PLT 418* 403* 373 287 282 -- -- 293 241    Basename 05/17/11 0525 05/16/11 0537 05/15/11 0521 05/14/11 0535 05/13/11 0539 05/12/11 1814 05/12/11 1544 05/12/11 0635 05/11/11 0650  NA 134* 135 132* 132* 134* 138 137 -- --  K 4.2 3.7 3.5 3.7 4.0 4.1 4.0 -- --  CL 99 100 97 96 99 -- -- 98 100  CO2 27 27 25 25 26  -- -- 27 27  BUN 15 12 12 12 13  -- -- 12 11  CREATININE 0.76 0.71 0.60 0.66 0.78 -- -- 0.75 0.69  GLUCOSE 104* 136* 108*  114* 104* 133* 114* -- --  CALCIUM 8.0* 7.9* 7.8* 7.6* 7.6* -- -- 8.2* 8.0*   Lab Results  Component Value Date   INR 1.37 05/17/2011   INR 1.38 05/16/2011   INR 1.62* 05/15/2011    Examination:  General appearance: alert and no distress  Wound Exam: draining   Drainage:  Moderate amount Serous exudate  Motor Exam: altered peroneal function without change from yesterday  Sensory Exam: minimal possible decreased sensation  On dorsum of left foot-essentially no change  Vascular Exam: Normal  Assessment:    5 Days Post-Op  Procedure(s) (LRB): OPEN REDUCTION INTERNAL FIXATION (ORIF) ACETABULAR FRACTURE (Left)  ADDITIONAL DIAGNOSIS:  Principal Problem:  *Acetabulum fracture, left Active Problems:  Fx shaft tibia w/ fib-open, left  Multiple closed fxs of pelvis with stable disruption  of pelvic circle, left  Acute blood loss anemia  ETOH abuse  Nicotine use disorder  Atrial fibrillation  Hypokalemia  Acute Blood Loss Anemia   Plan: Physical Therapy as ordered Non Weight Bearing (NWB)  DVT Prophylaxis:  Lovenox and Coumadin  DISCHARGE PLAN: unknown at this time  DISCHARGE NEEDS: determined at time of D/C     Dressing left hip changed with serous drainage   Adalai Perl W 05/17/2011, 10:05 AM

## 2011-05-17 NOTE — Progress Notes (Signed)
Occupational Therapy Treatment Patient Details Name: [name] MRN: [id number] DOB: 1960-12-20 Today's Date: 05/17/2011 Time:  -     OT Assessment / Plan / Recommendation Comments on Treatment Session Pt. is making good progress with mobiity in prep for ADls, and was able to sit EOB today while performing grooming.  Recommend CIR    Follow Up Recommendations  Inpatient Rehab    Equipment Recommendations  Defer to next venue    Frequency Min 2X/week   Plan Discharge plan remains appropriate    Precautions / Restrictions Precautions Precautions: Posterior Hip;Fall Precaution Booklet Issued: No Required Braces or Orthoses: Other Brace/Splint Other Brace/Splint: PRAFO Restrictions Weight Bearing Restrictions: Yes LLE Weight Bearing: Touchdown weight bearing   Pertinent Vitals/Pain     ADL  Grooming: Brushing hair;Performed;Minimal assistance Where Assessed - Grooming: Unsupported sitting ADL Comments: Pt. moved to EOB with mod A, and increased time to complete.  pt. requiring cues to deep breathe due to pain.  Pt. sat EOB with supervision x 20 mins.  PT arrived and attempted sit to stand x 1 - see PT notes for details.  Pain 10/10 with activity    OT Goals Acute Rehab OT Goals Potential to Achieve Goals: Good ADL Goals ADL Goal: Grooming - Progress: Progressing toward goals Arm Goals Additional Arm Goal #2: Pt. will be independent with HEP for UE strengthening Arm Goal: Additional Goal #2 - Progress: Progressing toward goals Miscellaneous OT Goals OT Goal: Miscellaneous Goal #1 - Progress: Progressing toward goals OT Goal: Miscellaneous Goal #2 - Progress: Progressing toward goals  Visit Information  Last OT Received On: 05/17/11 Assistance Needed: +2 PT/OT Co-Evaluation/Treatment: Yes    Subjective Data      Prior Functioning       Cognition  Overall Cognitive Status: Appears within functional limits for tasks assessed/performed Arousal/Alertness:  Awake/alert Orientation Level: Oriented X4 / Intact Behavior During Session: Anxious    Mobility Bed Mobility Bed Mobility: Sit to Supine;Supine to Sit;Sitting - Scoot to Edge of Bed Supine to Sit: 3: Mod assist;HOB elevated (45) Supine to Sit: Patient Percentage: 50% Sitting - Scoot to Edge of Bed: 4: Min guard Sit to Supine: 1: +2 Total assist;HOB elevated (40) Sit to Supine: Patient Percentage: 30% Details for Bed Mobility Assistance: Pt requires increased time and encouragement for deep breathing Transfers Transfers: Sit to Stand;Stand to Sit Transfers: No Sit to Stand: 1: +2 Total assist;With upper extremity assist;From bed Sit to Stand: Patient Percentage: 40% Stand to Sit: 1: +2 Total assist;With upper extremity assist;To bed Stand to Sit: Patient Percentage: 50% Details for Transfer Assistance: patient able to obtain 85% upright and able to hold for ~1 minute before needed to return to sitting.  Patient unable to maintain TDWB status during standing which also facilitated need to return to sitting; patient anxious throughout transfer and required cueing to slow breathing                      Exercises Other Exercises Other Exercises: Pt. was provided with blue theraband and instructed in chest press, and elbow extension.    Balance Static Sitting Balance Static Sitting - Balance Support: Bilateral upper extremity supported;Feet supported Static Sitting - Level of Assistance: 7: Independent Static Sitting - Comment/# of Minutes: 10 minutes  End of Session OT - End of Session Equipment Utilized During Treatment:  (trapeze) Activity Tolerance: Patient limited by pain;Patient tolerated treatment well Patient left: in bed;with call bell/phone within reach;with family/visitor present  Jeani Hawking M 05/17/2011, 5:48 PM

## 2011-05-17 NOTE — Progress Notes (Signed)
Physical Therapy Treatment Patient Details Name: [name] MRN: [id number] DOB: [birth year] Today's Date: 05/17/2011 Time: 1410-1430 PT Time Calculation (min): 20 min  PT Assessment / Plan / Recommendation Comments on Treatment Session  patient continues to progress with mobility.  Patient to edge of bed with OT (before PT arrived), attempted sit-to-stand transfer and reached ~85% upright.  Patient continues to be limited by pain and anxiety.    Follow Up Recommendations       Equipment Recommendations       Frequency     Plan Discharge plan remains appropriate    Precautions / Restrictions     Pertinent Vitals/Pain Continues to be limited by pain    Mobility  Transfers Transfers: Sit to Stand;Stand to Sit Sit to Stand: 1: +2 Total assist;With upper extremity assist;From bed Sit to Stand: Patient Percentage: 40% Stand to Sit: 1: +2 Total assist;With upper extremity assist;To bed Stand to Sit: Patient Percentage: 50% Details for Transfer Assistance: patient able to obtain 85% upright and able to hold for ~1 minute before needed to return to sitting.  Patient unable to maintain TDWB status during standing which also facilitated need to return to sitting; patient anxious throughout transfer and required cueing to slow breathing                       Exercises     PT Goals Acute Rehab PT Goals PT Goal: Sit to Stand - Progress: Progressing toward goal PT Goal: Stand to Sit - Progress: Progressing toward goal  Visit Information  Last PT Received On: 05/17/11 Assistance Needed: +2 PT/OT Co-Evaluation/Treatment: Yes    Subjective Data  Subjective: patient complained of pain in shin   Cognition  Overall Cognitive Status: Appears within functional limits for tasks assessed/performed Arousal/Alertness: Awake/alert Orientation Level: Oriented X4 / Intact Behavior During Session: [facility] for tasks performed    Balance  Static Sitting Balance Static Sitting - Balance  Support: Bilateral upper extremity supported;Feet supported Static Sitting - Level of Assistance: 7: Independent Static Sitting - Comment/# of Minutes: 10 minutes  End of Session PT - End of Session Equipment Utilized During Treatment: Left ankle foot orthosis Activity Tolerance: Patient limited by pain Patient left: in bed;with call bell/phone within reach;with family/visitor present    Olivia Canter, [place] [phone] 05/17/2011, 3:37 PM

## 2011-05-18 ENCOUNTER — Inpatient Hospital Stay (HOSPITAL_COMMUNITY): Payer: No Typology Code available for payment source

## 2011-05-18 LAB — CBC
Hemoglobin: 9.3 g/dL — ABNORMAL LOW (ref 13.0–17.0)
MCHC: 32.3 g/dL (ref 30.0–36.0)
Platelets: 455 10*3/uL — ABNORMAL HIGH (ref 150–400)
RDW: 14.8 % (ref 11.5–15.5)

## 2011-05-18 LAB — URINALYSIS, ROUTINE W REFLEX MICROSCOPIC
Nitrite: NEGATIVE
Specific Gravity, Urine: 1.012 (ref 1.005–1.030)
Urobilinogen, UA: 2 mg/dL — ABNORMAL HIGH (ref 0.0–1.0)

## 2011-05-18 LAB — URINE MICROSCOPIC-ADD ON

## 2011-05-18 LAB — PROTIME-INR
INR: 1.39 (ref 0.00–1.49)
Prothrombin Time: 17.3 seconds — ABNORMAL HIGH (ref 11.6–15.2)

## 2011-05-18 MED ORDER — WARFARIN SODIUM 10 MG PO TABS
10.0000 mg | ORAL_TABLET | Freq: Once | ORAL | Status: AC
  Administered 2011-05-18: 10 mg via ORAL
  Filled 2011-05-18: qty 1

## 2011-05-18 NOTE — Progress Notes (Signed)
Physical Therapy Treatment Patient Details Name: [name] MRN: [id number] DOB: [birth year] Today's Date: 05/18/2011 Time: 6962-9528 PT Time Calculation (min): 15 min  PT Assessment / Plan / Recommendation Comments on Treatment Session  Pt politely refusing OOB therapy today, demonstrates severely weaked left LE, instructed on simple active exercise to improve motor control in preparation for functional activities.  Note therapy recommends INPATIENT REHAB c/s, however do not see order for c/s written, MD please address.    Follow Up Recommendations  Inpatient Rehab    Equipment Recommendations  Defer to next venue    Frequency Min 5X/week   Plan Discharge plan remains appropriate    Precautions / Restrictions Precautions Precautions: Posterior Hip;Fall Precaution Booklet Issued: No Required Braces or Orthoses: Other Brace/Splint Other Brace/Splint: PRAFO Restrictions Weight Bearing Restrictions: Yes LLE Weight Bearing: Touchdown weight bearing   Pertinent Vitals/Pain Reports 7/10 pain, premedicated 60 min prior to visit    Mobility  Bed Mobility Bed Mobility: Not assessed Rolling Right: Not tested (comment) Supine to Sit: Not tested (comment) Sitting - Scoot to Edge of Bed: Not tested (comment) Sit to Supine: Not Tested (comment) Transfers Transfers: Not assessed Sit to Stand: Not tested (comment) Stand to Sit: Not tested (comment) Ambulation/Gait Ambulation/Gait Assistance: Not tested (comment) Stairs: No Wheelchair Mobility Wheelchair Mobility: No    Exercises General Exercises - Upper Extremity Shoulder Flexion: Theraband;10 reps;Strengthening;Supine Theraband Level (Shoulder Flexion): Level 4 (Blue) Shoulder Extension: Strengthening;Both;10 reps;Supine;Theraband Theraband Level (Shoulder Extension): Level 4 (Blue) Shoulder ABduction: Strengthening;Both;10 reps;Theraband;Supine Theraband Level (Shoulder Abduction): Level 4 (Blue) Shoulder ADduction:  Strengthening;Both;10 reps;Supine;Theraband Theraband Level (Shoulder Adduction): Level 4 (Blue) Shoulder Horizontal ABduction: Strengthening;Both;10 reps;Supine;Theraband Theraband Level (Shoulder Horizontal Abduction): Level 4 (Blue) Shoulder Horizontal ADduction: Strengthening;10 reps;Supine;Theraband Theraband Level (Shoulder Horizontal Adduction): Level 4 (Blue) General Exercises - Lower Extremity Ankle Circles/Pumps: AROM;Left;10 reps;Supine;Limitations (only able to weakly wiggle toes) Ankle Circles/Pumps Limitations: wound vac, edema, pain.  only wiggles great toe Quad Sets: AROM;Left;10 reps;Supine;Limitations Quad Sets Limitations: weakly activates quad on left LE, appears unable to sustain muscle activity Gluteal Sets: AROM;Both;10 reps;Sidelying Other Exercises Other Exercises: chest press Other Exercises: instructed on frequency of 10 reps, 1 set, hourly as able   PT Goals Acute Rehab PT Goals PT Goal Formulation: With patient PT Goal: Supine/Side to Sit - Progress: Not met PT Goal: Sit at Edge Of Bed - Progress: Not met PT Goal: Sit to Supine/Side - Progress: Not met PT Goal: Sit to Stand - Progress: Not met PT Goal: Stand to Sit - Progress: Not met PT Transfer Goal: Bed to Chair/Chair to Bed - Progress: Not met Additional Goals PT Goal: Additional Goal #1 - Progress: Progressing toward goal  Visit Information  Last PT Received On: 05/18/11 Reason Eval/Treat Not Completed: Patient refused (agreeable to bed-level activities only)    Subjective Data  Subjective: "It's Sunday, not a good day to cuss."   Cognition  Overall Cognitive Status: Appears within functional limits for tasks assessed/performed Arousal/Alertness: Awake/alert Orientation Level: Oriented X4 / Intact Behavior During Session: [facility] for tasks performed    Balance  Balance Balance Assessed: No  End of Session PT - End of Session Equipment Utilized During Treatment: Left ankle foot  orthosis Activity Tolerance: Patient limited by pain Patient left: in bed;with call bell/phone within reach    Dennis Bast 05/18/2011, 11:54 AM

## 2011-05-18 NOTE — Progress Notes (Signed)
ANTICOAGULATION CONSULT NOTE - Follow Up Consult  Pharmacy Consult for Coumadin Indication: VTE prophylaxis  No Known Allergies  Patient Measurements: Height: 5\' 9"  (175.3 cm) Weight: 240 lb (108.863 kg) IBW/kg (Calculated) : 70.7   Vital Signs: Temp: 98.3 F (36.8 C) (04/21 0628) BP: 128/65 mmHg (04/21 0628) Pulse Rate: 90  (04/21 0628)  Labs:  Basename 05/18/11 0515 05/17/11 0525 05/16/11 0537  HGB 9.3* 8.7* --  HCT 28.8* 27.0* 26.8*  PLT 455* 418* 403*  APTT -- -- --  LABPROT 17.3* 17.1* 17.2*  INR 1.39 1.37 1.38  HEPARINUNFRC -- -- --  CREATININE -- 0.76 0.71  CKTOTAL -- -- --  CKMB -- -- --  TROPONINI -- -- --   Estimated Creatinine Clearance: 135.9 ml/min (by C-G formula based on Cr of 0.76).   Medications:  Scheduled:    . diltiazem  120 mg Oral Daily  . enoxaparin  30 mg Subcutaneous Q12H  . feeding supplement  237 mL Oral BID BM  . ferrous sulfate  325 mg Oral TID WC  . methocarbamol  500-1,000 mg Oral QID   Or  . methocarbamol (ROBAXIN) IV  500 mg Intravenous QID  . off the beat book   Does not apply Once  . pantoprazole  40 mg Oral Q1200  . pregabalin  100 mg Oral TID  . senna  1 tablet Oral QHS  . vitamin C  500 mg Oral Daily  . warfarin  10 mg Oral ONCE-1800  . Warfarin - Pharmacist Dosing Inpatient   Does not apply q1800    Assessment: 51yo male s/p L-ORIF.  INR 1.39- expect to increase tomorrow.  No bleeding problems noted.  Goal of Therapy:  INR 2-3   Plan:  1.  Repeat Coumadin 10mg  2.  Continue daily INR  Marisue Humble, PharmD Clinical Pharmacist [facility] [place]

## 2011-05-18 NOTE — Progress Notes (Signed)
Occupational Therapy Treatment Patient Details Name: [name] MRN: [id number] DOB: 1960/12/09 Today's Date: 05/18/2011 Time:  -     OT Assessment / Plan / Recommendation Comments on Treatment Session encouraged pt. participation with adls including grooming while sitting eob     Follow Up Recommendations  Inpatient Rehab    Equipment Recommendations  Defer to next venue    Frequency Min 2X/week   Plan Discharge plan remains appropriate    Precautions / Restrictions Precautions Precautions: Posterior Hip;Fall Required Braces or Orthoses: Other Brace/Splint Other Brace/Splint: PRAFO Restrictions Weight Bearing Restrictions: Yes LLE Weight Bearing: Touchdown weight bearing    ADL       OT Goals Acute Rehab OT Goals Potential to Achieve Goals: Good ADL Goals ADL Goal: Grooming - Progress: Progressing toward goals Arm Goals Arm Goal: Additional Goal #2 - Progress: Progressing toward goals Miscellaneous OT Goals OT Goal: Miscellaneous Goal #2 - Progress: Progressing toward goals  Visit Information       Subjective Data  Subjective: "oh my exercises i've been doing these all morning"   Prior Functioning       Cognition       Mobility     Exercises General Exercises - Upper Extremity Shoulder Flexion: Theraband;10 reps;Strengthening;Supine Theraband Level (Shoulder Flexion): Level 4 (Blue) Shoulder Extension: Strengthening;Both;10 reps;Supine;Theraband Theraband Level (Shoulder Extension): Level 4 (Blue) Shoulder ABduction: Strengthening;Both;10 reps;Theraband;Supine Theraband Level (Shoulder Abduction): Level 4 (Blue) Shoulder ADduction: Strengthening;Both;10 reps;Supine;Theraband Theraband Level (Shoulder Adduction): Level 4 (Blue) Shoulder Horizontal ABduction: Strengthening;Both;10 reps;Supine;Theraband Theraband Level (Shoulder Horizontal Abduction): Level 4 (Blue) Shoulder Horizontal ADduction: Strengthening;10 reps;Supine;Theraband Theraband Level  (Shoulder Horizontal Adduction): Level 4 (Blue) Other Exercises Other Exercises: chest press  Balance    End of Session OT - End of Session Activity Tolerance: Patient tolerated treatment well Patient left: in bed;with call bell/phone within reach   Robet Leu 05/18/2011, 11:38 AM

## 2011-05-18 NOTE — Progress Notes (Signed)
Patient ID: [name], male   DOB: 08/16/1960, 51 y.o.   MRN: [id number] PATIENT ID: [name]        MRN:  [id number]          DOB/AGE: 15-Jan-1961 / 51 y.o.  Bradley Campbell, MD   Bradley Code, PA-C [address] [place], [place], [place]  [address]                             [phone]  2 Subjective:  negative for Chest Pain  negative for Shortness of Breath  negative for Nausea/Vomiting   negative for Calf Pain  positive for Bowel Movement   Tolerating Diet: yes         Patient reports pain as mild.    Objective: Vital signs in last 24 hours:   Patient Vitals for the past 24 hrs:  BP Temp Temp src Pulse Resp SpO2  05/18/11 0628 128/65 mmHg 98.3 F (36.8 C) - 90  18  98 %  05/17/11 2226 130/63 mmHg 99 F (37.2 C) - 95  18  98 %  05/17/11 1515 142/69 mmHg 99.1 F (37.3 C) Oral 94  18  98 %      Intake/Output from previous day:   04/20 0701 - 04/21 0700 In: 1480 [P.O.:1480] Out: 6200 [Urine:6200]   Intake/Output this shift:       Intake/Output      04/20 0701 - 04/21 0700 04/21 0701 - 04/22 0700   P.O. 1480    Total Intake(mL/kg) 1480 (13.6)    Urine (mL/kg/hr) 6200 (2.4)    Total Output 6200    Net -4720         Stool Occurrence 1 x       LABORATORY DATA:  Basename 05/18/11 0515 05/17/11 0525 05/16/11 5956 05/15/11 3875 05/14/11 0535 05/13/11 0539 05/12/11 1814 05/12/11 0635  WBC 15.0* 12.9* 14.0* 18.1* 17.7* 13.0* -- 12.6*  HGB 9.3* 8.7* 8.8* 8.1* 9.2* 9.6* 9.5* --  HCT 28.8* 27.0* 26.8* 24.5* 27.2* 28.6* 28.0* --  PLT 455* 418* 403* 373 287 282 -- 293    Basename 05/17/11 0525 05/16/11 0537 05/15/11 0521 05/14/11 0535 05/13/11 0539 05/12/11 1814 05/12/11 1544 05/12/11 0635  NA 134* 135 132* 132* 134* 138 137 --  K 4.2 3.7 3.5 3.7 4.0 4.1 4.0 --  CL 99 100 97 96 99 -- -- 98  CO2 27 27 25 25 26  -- -- 27  BUN 15 12 12 12 13  -- -- 12  CREATININE 0.76 0.71 0.60 0.66 0.78 -- -- 0.75  GLUCOSE 104* 136* 108* 114* 104* 133* 114* --  CALCIUM 8.0*  7.9* 7.8* 7.6* 7.6* -- -- 8.2*   Lab Results  Component Value Date   INR 1.39 05/18/2011   INR 1.37 05/17/2011   INR 1.38 05/16/2011    Examination:  General appearance: alert, cooperative and no distress Resp: diminished breath sounds base - right Cardio: regular rate and rhythm, S1, S2 normal, no murmur, click, rub or gallop GI: soft, non-tender; bowel sounds normal; no masses,  no organomegaly Extremities: edema consistent with injury  Wound Exam: clean, dry, intact   Drainage:  None: wound tissue dry  Motor Exam: unable to dorsiflex left foot-no change  Sensory Exam: some altered sensation on dorsum of foot-no change diminished-left foot  Vascular Exam: Normal  Assessment:    6 Days Post-Op  Procedure(s) (LRB): OPEN REDUCTION INTERNAL FIXATION (ORIF) ACETABULAR  FRACTURE (Left)  ADDITIONAL DIAGNOSIS:  Principal Problem:  *Acetabulum fracture, left Active Problems:  Fx shaft tibia w/ fib-open, left  Multiple closed fxs of pelvis with stable disruption of pelvic circle, left  Acute blood loss anemia  ETOH abuse  Nicotine use disorder  Atrial fibrillation  Hypokalemia  Acute Blood Loss Anemia   Plan: Physical Therapy as ordered Touch Down Weight Bearing (TDWB)-LLE DVT Prophylaxis:  Coumadin  DISCHARGE PLAN: undetermined  DISCHARGE NEEDS: unknown at this time    WBC elevated-will obtain urine C&S and CXR with decreased sounds on right     [name] 05/18/2011, 10:25 AM

## 2011-05-18 NOTE — Progress Notes (Signed)
CSW received consult for SNF. PT/OT recommending inpatient rehab. Will continue to follow for possible SNF pending CIR evaluation. Dellie Burns, MSW, [place] [phone] (weekend)

## 2011-05-19 ENCOUNTER — Encounter (HOSPITAL_COMMUNITY): Payer: Self-pay | Admitting: Physical Medicine and Rehabilitation

## 2011-05-19 DIAGNOSIS — J9811 Atelectasis: Secondary | ICD-10-CM

## 2011-05-19 DIAGNOSIS — S82109A Unspecified fracture of upper end of unspecified tibia, initial encounter for closed fracture: Secondary | ICD-10-CM

## 2011-05-19 DIAGNOSIS — G5730 Lesion of lateral popliteal nerve, unspecified lower limb: Secondary | ICD-10-CM

## 2011-05-19 DIAGNOSIS — S32409A Unspecified fracture of unspecified acetabulum, initial encounter for closed fracture: Secondary | ICD-10-CM

## 2011-05-19 MED ORDER — WARFARIN SODIUM 10 MG PO TABS
10.0000 mg | ORAL_TABLET | Freq: Once | ORAL | Status: AC
  Administered 2011-05-19: 10 mg via ORAL
  Filled 2011-05-19: qty 1

## 2011-05-19 NOTE — Progress Notes (Signed)
Physical Therapy Treatment Patient Details Name: [name] MRN: [id number] DOB: [birth year] Today's Date: 05/19/2011 Time: 1401-1500 PT Time Calculation (min): 59 min  PT Assessment / Plan / Recommendation Comments on Treatment Session  Pt very frustrated by slow progress but did progress today with standing EOB.  He is also frustrated by weakness in LLE and UE's from prolonged immobility.  Encouraged pt in the gains he made today and to keep working hard ot get out of the bed.  Continue to recommend CIR consult and PT will continue to follow.    Follow Up Recommendations  Inpatient Rehab    Equipment Recommendations  Defer to next venue    Frequency Min 5X/week   Plan Discharge plan remains appropriate;Frequency remains appropriate    Precautions / Restrictions Precautions Precautions: Posterior Hip;Fall Precaution Booklet Issued: No Required Braces or Orthoses: Other Brace/Splint Other Brace/Splint: PRAFO Restrictions Weight Bearing Restrictions: Yes LLE Weight Bearing: Touchdown weight bearing Other Position/Activity Restrictions: pt able to tolerate elevating left leg on oillow at end of Rx, not above heart but ankle above hip   Pertinent Vitals/Pain 8/10 pain    Mobility  Bed Mobility Bed Mobility: Rolling Right;Right Sidelying to Sit;Sitting - Scoot to [place] of Bed;Sit to Sidelying Right;Scooting to [facility] Rolling Right: 1: +2 Total assist Rolling Right: Patient Percentage: 60% Right Sidelying to Sit: 1: +2 Total assist;HOB elevated (HOB 20 deg) Right Sidelying to Sit: Patient Percentage: 50% Supine to Sit: Not tested (comment) (used rolling instead) Sitting - Scoot to [place] of Bed: 2: Max assist Sit to Supine: Not Tested (comment) Sit to Sidelying Right: 1: +2 Total assist;HOB flat Sit to Sidelying Right: Patient Percentage: 50% Scooting to HOB: With trapeze;1: +2 Total assist Scooting to [facility]: Patient Percentage: 70% Details for Bed Mobility Assistance: pt rolled  right with right LE supported by therapist and pt using rail.  vc's for reaching for rail.  He tolerated this part of the transfer well but had great difficulty when it came to SL to sitting.  Pt initiated sit but got to the right elbow and was fearful of pushing away from the bed and transferring wt to the left hip.  He also would not slide left hip fwd.  Eventually, he let therapist assist him in sliding left hip out and came to full sitting with wt R>L hip.  Left leg slowly lowered to floor. Transfers Transfers: Sit to Stand;Stand to Sit Sit to Stand: 1: +2 Total assist;From bed;From elevated surface;With upper extremity assist Sit to Stand: Patient Percentage: 50% Stand to Sit: 1: +2 Total assist;With upper extremity assist;To bed Stand to Sit: Patient Percentage: 50% Details for Transfer Assistance: Pt stood fully 2x with hips partially flexed, tactile and verbal cues for post pelvic tilt and lifting chest.  Cues given at bilateral triceps to extend elbows to lift self up. Pt with generalized weakness at this point in bilateral UE's and right LE (pt with torn ACL on right as well x 95yrs).  Pt maintained standing 2 min each time.  Assistance given to maintain TDWB LLE.  Wife asked to bring shoe or boot in for left foot to increase length of LLE to make it easier to minimize wt on left. Ambulation/Gait Ambulation/Gait Assistance: Not tested (comment) (unable) Assistive device: Rolling walker (for sit to stand transfer) Stairs: No Wheelchair Mobility Wheelchair Mobility: No    Exercises General Exercises - Lower Extremity Ankle Circles/Pumps: AROM;10 reps;Supine;Both;Other (comment) (still no active df on left, moves toes) Quad Sets:  AROM;Right;Strengthening;10 reps;Supine Straight Leg Raises: AROM;Right;10 reps;Strengthening;Supine (only 10 deg to keep prec) Other Exercises Other Exercises: seated tricep press, scooting laterally EOB   PT Goals Acute Rehab PT Goals PT Goal Formulation:  With patient Time For Goal Achievement: 05/21/11 Potential to Achieve Goals: Fair Pt will go Supine/Side to Sit: with mod assist;with rail PT Goal: Supine/Side to Sit - Progress: Progressing toward goal Pt will Sit at Edge of Bed: with supervision;3-5 min;with no upper extremity support PT Goal: Sit at Edge Of Bed - Progress: Progressing toward goal Pt will go Sit to Supine/Side: with mod assist;with HOB 0 degrees PT Goal: Sit to Supine/Side - Progress: Progressing toward goal Pt will go Sit to Stand: with +2 total assist;from elevated surface;with upper extremity assist (pt 70%) PT Goal: Sit to Stand - Progress: Progressing toward goal Pt will go Stand to Sit: with +2 total assist;to elevated surface;with upper extremity assist (pt 70%) PT Goal: Stand to Sit - Progress: Progressing toward goal Pt will Transfer Bed to Chair/Chair to Bed: with +2 total assist (pt 70% and least restrictive AD) PT Transfer Goal: Bed to Chair/Chair to Bed - Progress: Progressing toward goal Additional Goals Additional Goal #1: Pt able to verbalize hip precautions and able to direct caregivers in how to assist/move his LLE to maintain precautions. 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   Cognition  Overall Cognitive Status: Appears within functional limits for tasks assessed/performed Arousal/Alertness: Awake/alert Orientation Level: Oriented X4 / Intact Behavior During Session: WFL for tasks performed Cognition - Other Comments: pt very anxious    Balance  Balance Balance Assessed: Yes Static Standing Balance Static Standing - Balance Support: Bilateral upper extremity supported Static Standing - Level of Assistance: 1: +2 Total assist;Patient percentage (comment) (pt 60%) Static  Standing - Comment/# of Minutes: 2  End of Session PT - End of Session Equipment Utilized During Treatment: Gait belt;Left ankle foot orthosis Activity Tolerance: Patient limited by pain (pt limited by weakness) Patient left: in bed;with call bell/phone within reach;with family/visitor present Nurse Communication: Mobility status   Lyanne Co, PT  Acute Rehab Services  [phone]   Lyanne Co 05/19/2011, 3:59 PM

## 2011-05-19 NOTE — Progress Notes (Signed)
Subjective: 7 Days Post-Op Procedure(s) (LRB): OPEN REDUCTION INTERNAL FIXATION (ORIF) ACETABULAR FRACTURE (Left)  Doing ok today Pain ok when at rest, increases with therapy No cp, no sob, no palpitations + BM over weekend Scrotal swelling decreasing Foley still in place U/a over weekend negative cxr over weekend notable for ATX Pt still wants to discharge home, but still only sitting on EOB for 20 minutes at a time  Objective: Current Vitals Blood pressure 126/59, pulse 86, temperature 98 F (36.7 C), temperature source Oral, resp. rate 18, height 5\' 9"  (1.753 m), weight 108.863 kg (240 lb), SpO2 97.00%. Vital signs in last 24 hours: Temp:  [98 F (36.7 C)-99.1 F (37.3 C)] 98 F (36.7 C) (04/22 0603) Pulse Rate:  [86-96] 86  (04/22 0603) Resp:  [18] 18  (04/22 0603) BP: (114-126)/(55-64) 126/59 mmHg (04/22 0603) SpO2:  [95 %-97 %] 97 % (04/22 0603)  Intake/Output from previous day: 04/21 0701 - 04/22 0700 In: 2000 [P.O.:2000] Out: 6000 [Urine:6000] Intake/Output      04/21 0701 - 04/22 0700 04/22 0701 - 04/23 0700   P.O. 2000 240   Total Intake(mL/kg) 2000 (18.4) 240 (2.2)   Urine (mL/kg/hr) 6000 (2.3)    Total Output 6000    Net -4000 +240           LABS  Basename 05/18/11 0515 05/17/11 0525  HGB 9.3* 8.7*    Basename 05/18/11 0515 05/17/11 0525  WBC 15.0* 12.9*  RBC 3.19* 2.99*  HCT 28.8* 27.0*  PLT 455* 418*    Basename 05/17/11 0525  NA 134*  K 4.2  CL 99  CO2 27  BUN 15  CREATININE 0.76  GLUCOSE 104*  CALCIUM 8.0*    Basename 05/19/11 0636 05/18/11 0515  LABPT -- --  INR 1.72* 1.39    Physical Exam  ZOX:WRUEA, alert, NAD Lungs:clear anterior fields Cardiac:s1 and s2 Abd:+ BS, soft and nontender Pelvis:dressing stable, decreased scrotal swelling Ext: Left lower extremity  Wounds are stable  Swelling stable, not wearing TED hose  DPN, SPN,TN sensation grossly intact  Toe flexion and ankle flexion intact  Motor function of  peroneal branches still not appreciated  Ext is warm + DP pulse   Imaging Dg Chest 2 View  05/18/2011  *RADIOLOGY REPORT*  Clinical Data: Rule out atelectasis or pneumonia.  CHEST - 2 VIEW  Comparison: Chest x-ray 05/05/2011  Findings: The heart is enlarged.  There is mild elevation of the right hemidiaphragm.  No pulmonary edema.  There is minimal left base atelectasis.  No focal consolidations or pleural effusions are identified. Visualized osseous structures have a normal appearance.  IMPRESSION:  1.  Cardiomegaly without edema. 2.  Minimal left lower lobe atelectasis.  Original Report Authenticated By: Patterson Hammersmith, M.D.    Assessment/Plan: 7 Days Post-Op Procedure(s) (LRB): OPEN REDUCTION INTERNAL FIXATION (ORIF) ACETABULAR FRACTURE (Left)  51 year old male status post MCA   1. Complex T-type left acetabulum fracture dislocation   Posterior hip precautions   PT/OT   Bed to chair   Toe-touch weightbearing to facilitate transfers   Continue to monitor sciatic nerve (peroneal division)   Dressing changes when necessary   Continue with ice as needed  2. Open left tibia fracture status post ORIF   Dressing changes as needed   No range of motion restrictions the left knee and ankle   TED hose if tolerated 3. Peroneal nerve neurapraxia   Continue to monitor   Continue with PRAFO   Lyrica  Continue with passive motion of the toes and ankle throughout the day  4. acute blood loss anemia   Stable  Iron replaement 5. A. Fib   Resolved, normal sinus rhythm   Continue to monitor   Appreciated cards input  6. FEN   Diet as tolerated   Advance bowel regimen   Hypokalemia-resolved   Hyponatremia-hypotonic, euvolemic-resolved  7. DVT/PE prophylaxis   Continue with Lovenox bridge to Coumadin    Continue to monitor and titrate per pharmacy  8. scrotal edema   Continue with Foley   9. Pain   Po meds  10. ATX  Aggressive IS  GET PT OUT OF BED 11. Disposition   CIR  consult  Still not convinced that pt can safely d/c home at this time  Continue to work with therapies    Mearl Latin, PA-C [facility] [phone] (P) 05/19/2011, 8:58 AM

## 2011-05-19 NOTE — Consult Note (Signed)
ANTICOAGULATION CONSULT NOTE - Follow Up Consult  Pharmacy Consult for : Coumadin with Lovenox bridging Indication: VTE prophylaxis  No Known Allergies  Patient Measurements: Height: 5\' 9"  (175.3 cm) Weight: 240 lb (108.863 kg) IBW/kg (Calculated) : 70.7    Vital Signs: Temp: 98.1 F (36.7 C) (04/22 1400) Temp src: Oral (04/22 1400) BP: 122/66 mmHg (04/22 1400) Pulse Rate: 85  (04/22 1400)  Labs:  Basename 05/19/11 0636 05/18/11 0515 05/17/11 0525  HGB -- 9.3* 8.7*  HCT -- 28.8* 27.0*  PLT -- 455* 418*  APTT -- -- --  LABPROT 20.5* 17.3* 17.1*  INR 1.72* 1.39 1.37  HEPARINUNFRC -- -- --  CREATININE -- -- 0.76  CKTOTAL -- -- --  CKMB -- -- --  TROPONINI -- -- --   Estimated Creatinine Clearance: 135.9 ml/min (by C-G formula based on Cr of 0.76).   Medications:     diltiazem 120 mg Oral Daily  enoxaparin 30 mg Subcutaneous Q12H  feeding supplement 237 mL Oral BID BM  ferrous sulfate 325 mg Oral TID WC  methocarbamol 500-1,000 mg Oral QID  Or     methocarbamol (ROBAXIN) IV 500 mg Intravenous QID  off the beat book  Does not apply Once  pantoprazole 40 mg Oral Q1200  pregabalin 100 mg Oral TID  senna 1 tablet Oral QHS  vitamin C 500 mg Oral Daily  warfarin 10 mg Oral ONCE-1800  Warfarin - Pharmacist Dosing Inpatient  Does not apply q1800   Assessment:  51 y/o male s/p L-ORIF following a MCA.  Currently on Coumadin with Lovenox bridging for VTE prophylaxis.   INR 1.72,  Last Hgb 9.3  Goal of Therapy:   INR 2-3   Plan:   Repeat Coumadin 10 mg po today  Continue Lovenox bridging.  Muriah Harsha, Elisha Headland, Pharm.D. 05/19/2011 3:27 PM
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
## 2011-05-20 ENCOUNTER — Inpatient Hospital Stay (HOSPITAL_COMMUNITY)
Admission: RE | Admit: 2011-05-20 | Discharge: 2011-05-29 | DRG: 945 | Disposition: A | Discharge status: Home Health | Payer: No Typology Code available for payment source | Source: Ambulatory Visit | Attending: Physical Medicine & Rehabilitation | Admitting: Physical Medicine & Rehabilitation

## 2011-05-20 ENCOUNTER — Encounter (HOSPITAL_COMMUNITY): Payer: Self-pay | Admitting: *Deleted

## 2011-05-20 DIAGNOSIS — L02419 Cutaneous abscess of limb, unspecified: Secondary | ICD-10-CM

## 2011-05-20 DIAGNOSIS — R5381 Other malaise: Secondary | ICD-10-CM

## 2011-05-20 DIAGNOSIS — F17200 Nicotine dependence, unspecified, uncomplicated: Secondary | ICD-10-CM

## 2011-05-20 DIAGNOSIS — D62 Acute posthemorrhagic anemia: Secondary | ICD-10-CM

## 2011-05-20 DIAGNOSIS — S91309A Unspecified open wound, unspecified foot, initial encounter: Secondary | ICD-10-CM

## 2011-05-20 DIAGNOSIS — M79609 Pain in unspecified limb: Secondary | ICD-10-CM

## 2011-05-20 DIAGNOSIS — S82109A Unspecified fracture of upper end of unspecified tibia, initial encounter for closed fracture: Secondary | ICD-10-CM

## 2011-05-20 DIAGNOSIS — S32810A Multiple fractures of pelvis with stable disruption of pelvic ring, initial encounter for closed fracture: Secondary | ICD-10-CM | POA: Diagnosis present

## 2011-05-20 DIAGNOSIS — S82209B Unspecified fracture of shaft of unspecified tibia, initial encounter for open fracture type I or II: Secondary | ICD-10-CM

## 2011-05-20 DIAGNOSIS — D72829 Elevated white blood cell count, unspecified: Secondary | ICD-10-CM

## 2011-05-20 DIAGNOSIS — S32409A Unspecified fracture of unspecified acetabulum, initial encounter for closed fracture: Secondary | ICD-10-CM

## 2011-05-20 DIAGNOSIS — R209 Unspecified disturbances of skin sensation: Secondary | ICD-10-CM

## 2011-05-20 DIAGNOSIS — M216X9 Other acquired deformities of unspecified foot: Secondary | ICD-10-CM

## 2011-05-20 DIAGNOSIS — S82409B Unspecified fracture of shaft of unspecified fibula, initial encounter for open fracture type I or II: Secondary | ICD-10-CM | POA: Diagnosis present

## 2011-05-20 DIAGNOSIS — R339 Retention of urine, unspecified: Secondary | ICD-10-CM

## 2011-05-20 DIAGNOSIS — S81009A Unspecified open wound, unspecified knee, initial encounter: Secondary | ICD-10-CM

## 2011-05-20 DIAGNOSIS — Z5189 Encounter for other specified aftercare: Secondary | ICD-10-CM

## 2011-05-20 DIAGNOSIS — M25559 Pain in unspecified hip: Secondary | ICD-10-CM

## 2011-05-20 DIAGNOSIS — I4891 Unspecified atrial fibrillation: Secondary | ICD-10-CM | POA: Diagnosis not present

## 2011-05-20 DIAGNOSIS — G5730 Lesion of lateral popliteal nerve, unspecified lower limb: Secondary | ICD-10-CM

## 2011-05-20 DIAGNOSIS — S81809A Unspecified open wound, unspecified lower leg, initial encounter: Secondary | ICD-10-CM

## 2011-05-20 DIAGNOSIS — Z7901 Long term (current) use of anticoagulants: Secondary | ICD-10-CM

## 2011-05-20 DIAGNOSIS — L03119 Cellulitis of unspecified part of limb: Secondary | ICD-10-CM

## 2011-05-20 DIAGNOSIS — E871 Hypo-osmolality and hyponatremia: Secondary | ICD-10-CM

## 2011-05-20 DIAGNOSIS — N5089 Other specified disorders of the male genital organs: Secondary | ICD-10-CM

## 2011-05-20 MED ORDER — SENNA 8.6 MG PO TABS
1.0000 | ORAL_TABLET | Freq: Every day | ORAL | Status: DC
  Administered 2011-05-20 – 2011-05-28 (×9): 8.6 mg via ORAL
  Filled 2011-05-20 (×13): qty 1

## 2011-05-20 MED ORDER — ENOXAPARIN SODIUM 30 MG/0.3ML ~~LOC~~ SOLN
30.0000 mg | Freq: Two times a day (BID) | SUBCUTANEOUS | Status: DC
  Administered 2011-05-20 – 2011-05-21 (×2): 30 mg via SUBCUTANEOUS
  Filled 2011-05-20 (×4): qty 0.3

## 2011-05-20 MED ORDER — VITAMIN C 500 MG PO TABS
500.0000 mg | ORAL_TABLET | Freq: Every day | ORAL | Status: DC
  Administered 2011-05-21 – 2011-05-29 (×9): 500 mg via ORAL
  Filled 2011-05-20 (×10): qty 1

## 2011-05-20 MED ORDER — TRAZODONE HCL 50 MG PO TABS: 25.0000 mg | ORAL_TABLET | Freq: Every evening | ORAL | Status: DC | PRN

## 2011-05-20 MED ORDER — ENSURE COMPLETE PO LIQD
237.0000 mL | Freq: Two times a day (BID) | ORAL | Status: DC
  Administered 2011-05-21 – 2011-05-26 (×10): 237 mL via ORAL
  Administered 2011-05-26: 14:00:00 via ORAL
  Administered 2011-05-27 – 2011-05-29 (×5): 237 mL via ORAL

## 2011-05-20 MED ORDER — ALUM & MAG HYDROXIDE-SIMETH 200-200-20 MG/5ML PO SUSP: 30.0000 mL | ORAL | Status: DC | PRN

## 2011-05-20 MED ORDER — WARFARIN - PHARMACIST DOSING INPATIENT
Freq: Every day | Status: DC
  Administered 2011-05-25: 18:00:00

## 2011-05-20 MED ORDER — TRAMADOL HCL 50 MG PO TABS
50.0000 mg | ORAL_TABLET | Freq: Four times a day (QID) | ORAL | Status: DC | PRN
  Filled 2011-05-20: qty 1

## 2011-05-20 MED ORDER — DIPHENHYDRAMINE HCL 25 MG PO CAPS: 25.0000 mg | ORAL_CAPSULE | Freq: Four times a day (QID) | ORAL | Status: DC | PRN

## 2011-05-20 MED ORDER — PREGABALIN 50 MG PO CAPS
100.0000 mg | ORAL_CAPSULE | Freq: Three times a day (TID) | ORAL | Status: DC
  Administered 2011-05-20 – 2011-05-29 (×25): 100 mg via ORAL
  Filled 2011-05-20 (×6): qty 2
  Filled 2011-05-20 (×2): qty 1
  Filled 2011-05-20: qty 2
  Filled 2011-05-20: qty 1
  Filled 2011-05-20: qty 2
  Filled 2011-05-20 (×2): qty 1
  Filled 2011-05-20 (×11): qty 2
  Filled 2011-05-20: qty 1
  Filled 2011-05-20 (×3): qty 2

## 2011-05-20 MED ORDER — WARFARIN SODIUM 10 MG PO TABS
10.0000 mg | ORAL_TABLET | Freq: Once | ORAL | Status: DC
  Filled 2011-05-20: qty 1

## 2011-05-20 MED ORDER — PANTOPRAZOLE SODIUM 40 MG PO TBEC
40.0000 mg | DELAYED_RELEASE_TABLET | Freq: Every day | ORAL | Status: DC
  Administered 2011-05-21 – 2011-05-28 (×8): 40 mg via ORAL
  Filled 2011-05-20 (×9): qty 1

## 2011-05-20 MED ORDER — BISACODYL 10 MG RE SUPP: 10.0000 mg | Freq: Every day | RECTAL | Status: DC | PRN

## 2011-05-20 MED ORDER — ACETAMINOPHEN 325 MG PO TABS
325.0000 mg | ORAL_TABLET | ORAL | Status: DC | PRN
  Administered 2011-05-26: 650 mg via ORAL
  Filled 2011-05-20: qty 2

## 2011-05-20 MED ORDER — WARFARIN SODIUM 10 MG PO TABS
10.0000 mg | ORAL_TABLET | Freq: Once | ORAL | Status: AC
  Administered 2011-05-20: 10 mg via ORAL
  Filled 2011-05-20: qty 1

## 2011-05-20 MED ORDER — FLEET ENEMA 7-19 GM/118ML RE ENEM
1.0000 | ENEMA | Freq: Once | RECTAL | Status: AC | PRN
  Filled 2011-05-20: qty 1

## 2011-05-20 MED ORDER — ONDANSETRON HCL 4 MG PO TABS: 4.0000 mg | ORAL_TABLET | Freq: Four times a day (QID) | ORAL | Status: DC | PRN

## 2011-05-20 MED ORDER — OXYCODONE HCL 10 MG PO TB12
20.0000 mg | ORAL_TABLET | Freq: Two times a day (BID) | ORAL | Status: DC
  Administered 2011-05-20 – 2011-05-21 (×2): 20 mg via ORAL
  Filled 2011-05-20 (×2): qty 2

## 2011-05-20 MED ORDER — GUAIFENESIN-DM 100-10 MG/5ML PO SYRP: 5.0000 mL | ORAL_SOLUTION | Freq: Four times a day (QID) | ORAL | Status: DC | PRN

## 2011-05-20 MED ORDER — ONDANSETRON HCL 4 MG/2ML IJ SOLN: 4.0000 mg | Freq: Four times a day (QID) | INTRAMUSCULAR | Status: DC | PRN

## 2011-05-20 MED ORDER — FERROUS SULFATE 325 (65 FE) MG PO TABS
325.0000 mg | ORAL_TABLET | Freq: Three times a day (TID) | ORAL | Status: DC
  Administered 2011-05-20 – 2011-05-29 (×26): 325 mg via ORAL
  Filled 2011-05-20 (×29): qty 1

## 2011-05-20 MED ORDER — DILTIAZEM HCL ER COATED BEADS 120 MG PO CP24
120.0000 mg | ORAL_CAPSULE | Freq: Every day | ORAL | Status: DC
  Administered 2011-05-21 – 2011-05-29 (×9): 120 mg via ORAL
  Filled 2011-05-20 (×10): qty 1

## 2011-05-20 MED ORDER — METHOCARBAMOL 750 MG PO TABS
1500.0000 mg | ORAL_TABLET | Freq: Four times a day (QID) | ORAL | Status: DC
  Administered 2011-05-20 – 2011-05-29 (×34): 1500 mg via ORAL
  Filled 2011-05-20 (×39): qty 2

## 2011-05-20 MED ORDER — OXYCODONE HCL 5 MG PO TABS
15.0000 mg | ORAL_TABLET | ORAL | Status: DC | PRN
  Administered 2011-05-20 – 2011-05-29 (×32): 15 mg via ORAL
  Filled 2011-05-20 (×33): qty 3

## 2011-05-20 NOTE — Progress Notes (Signed)
Orthopedic Tech Progress Note Patient Details:  [name] [birth year] [id number]  Patient ID: [name], male   DOB: [birth year], 51 y.o.   MRN: [id number] Correction;order completed by advanced  Nikki Dom 05/20/2011, 8:35 PM
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
## 2011-05-20 NOTE — Progress Notes (Signed)
Subjective: 8 Days Post-Op Procedure(s) (LRB): OPEN REDUCTION INTERNAL FIXATION (ORIF) ACETABULAR FRACTURE (Left) Doing slightly better today Stood up with therapy yesterday Deconditioned Evaluated by inpatient rehab yesterday Pt agreeable to rehab Tolerated breakfast this am  No acute issues noted  Objective: Current Vitals Blood pressure 112/62, pulse 87, temperature 97.2 F (36.2 C), temperature source Oral, resp. rate 20, height 5\' 9"  (1.753 m), weight 108.863 kg (240 lb), SpO2 97.00%. Vital signs in last 24 hours: Temp:  [97.2 F (36.2 C)-99.1 F (37.3 C)] 97.2 F (36.2 C) (04/23 0537) Pulse Rate:  [85-88] 87  (04/23 0537) Resp:  [18-20] 20  (04/23 0537) BP: (112-122)/(60-66) 112/62 mmHg (04/23 0537) SpO2:  [95 %-100 %] 97 % (04/23 0537)  Intake/Output from previous day: 04/22 0701 - 04/23 0700 In: 480 [P.O.:480] Out: 7175 [Urine:7175] Intake/Output      04/22 0701 - 04/23 0700 04/23 0701 - 04/24 0700   P.O. 480    Total Intake(mL/kg) 480 (4.4)    Urine (mL/kg/hr) 7175 (2.7)    Total Output 7175    Net -6695            LABS  Basename 05/18/11 0515  HGB 9.3*    Basename 05/18/11 0515  WBC 15.0*  RBC 3.19*  HCT 28.8*  PLT 455*   No results found for this basename: NA:2,K:2,CL:2,CO2:2,BUN:2,CREATININE:2,GLUCOSE:2,CALCIUM:2 in the last 72 hours  Basename 05/20/11 0500 05/19/11 0636  LABPT -- --  INR 2.06* 1.72*     Physical Exam  Gen:NAD, alert Lungs:clear Cardiac:reg Abd:+ BS Pelvis:dressing stable AVW:UJWJ unchanged from yesterday   Imaging Dg Chest 2 View  05/18/2011  *RADIOLOGY REPORT*  Clinical Data: Rule out atelectasis or pneumonia.  CHEST - 2 VIEW  Comparison: Chest x-ray 05/05/2011  Findings: The heart is enlarged.  There is mild elevation of the right hemidiaphragm.  No pulmonary edema.  There is minimal left base atelectasis.  No focal consolidations or pleural effusions are identified. Visualized osseous structures have a normal  appearance.  IMPRESSION:  1.  Cardiomegaly without edema. 2.  Minimal left lower lobe atelectasis.  Original Report Authenticated By: Patterson Hammersmith, M.D.    Assessment/Plan: 8 Days Post-Op Procedure(s) (LRB): OPEN REDUCTION INTERNAL FIXATION (ORIF) ACETABULAR FRACTURE (Left)  51 year old male status post MCA   1. Complex T-type left acetabulum fracture dislocation   Posterior hip precautions   PT/OT   Bed to chair   Toe-touch weightbearing to facilitate transfers   Continue to monitor sciatic nerve (peroneal division)   Dressing changes when necessary   Continue with ice as needed  2. Open left tibia fracture status post ORIF   Dressing changes as needed   No range of motion restrictions the left knee and ankle   TED hose if tolerated  3. Peroneal nerve neurapraxia   Continue to monitor   Continue with PRAFO   Lyrica   Continue with passive motion of the toes and ankle throughout the day  4. acute blood loss anemia   Stable   Iron replaement  5. A. Fib   Resolved, normal sinus rhythm   Continue to monitor   Appreciated cards input  6. FEN   Diet as tolerated   Advance bowel regimen   Hypokalemia-resolved   Hyponatremia-hypotonic, euvolemic-resolved  7. DVT/PE prophylaxis   Continue with Lovenox bridge to Coumadin   Continue to monitor and titrate per pharmacy  8. scrotal edema   Continue with Foley  9. Pain   Po meds  10. ATX  Aggressive IS  11. Disposition   Pt stable for transfer to CIR whenever bed available  Please contact me when bed available so transfer orders can be done  Mearl Latin, PA-C [facility] [phone] (P) 05/20/2011, 8:00 AM

## 2011-05-20 NOTE — Progress Notes (Signed)
OT Cancellation Note  Treatment cancelled today due to pt being transferred to CIR.Burnett Corrente Adelaida Reindel, OTR/L  [phone] 05/20/2011 05/20/2011, 2:36 PM
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
## 2011-05-20 NOTE — Progress Notes (Signed)
Orthopedic Tech Progress Note Patient Details:  DENVIL CANNING 04/14/1960 [id number]  Patient ID: JAXYN ROUT, male   DOB: 06-May-1960, 51 y.o.   MRN: [id number]   Nikki Dom 05/20/2011, 8:32 PM Order completed by bio-tech

## 2011-05-20 NOTE — Progress Notes (Signed)
Patient ID: [name], male   DOB: [birth year], 51 y.o.   MRN: [id number]   See am note from 05/20/2010  Bed available for inpatient rehab  Stable for discharge to CIR Will see pt at end of week to see how he is progressing   Last week I did place an order to get pt an AFO type device that could be worn most hours of the day.  Does not appear to be fitted for one.  Rec reordering.  Using Advanced   Mearl Latin, PA-C [facility] [phone] (P) 1:45 PM 05/20/2011
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
## 2011-05-20 NOTE — Discharge Instructions (Signed)
Orthopaedic Trauma Service Discharge Instructions, Pin Site and Wound Care   General Discharge Instructions  WEIGHT BEARING STATUS:Touch down WB L leg for transfers only  RANGE OF MOTION/ACTIVITY:Posterior hip precautions L hip o/w activity as tolerated  Diet: as you were eating previously.  Can use over the counter stool softeners and bowel preparations, such as Miralax, to help with bowel movements.  Narcotics can be constipating.  Be sure to drink plenty of fluids  STOP SMOKING OR USING NICOTINE PRODUCTS!!!!  As discussed nicotine severely impairs your body's ability to heal surgical and traumatic wounds but also impairs bone healing.  Wounds and bone heal by forming microscopic blood vessels (angiogenesis) and nicotine is a vasoconstrictor (essentially, shrinks blood vessels).  Therefore, if vasoconstriction occurs to these microscopic blood vessels they essentially disappear and are unable to deliver necessary nutrients to the healing tissue.  This is one modifiable factor that you can do to dramatically increase your chances of healing your injury.    (This means no smoking, no nicotine gum, patches, etc)  DO NOT USE NONSTEROIDAL ANTI-INFLAMMATORY DRUGS (NSAID'S)  Using products such as Advil (ibuprofen), Aleve (naproxen), Motrin (ibuprofen) for additional pain control during fracture healing can delay and/or prevent the healing response.  If you would like to take over the counter (OTC) medication, Tylenol (acetaminophen) is ok.  However, some narcotic medications that are given for pain control contain acetaminophen as well. Therefore, you should not exceed more than 4000 mg of tylenol in a day if you do not have liver disease.  Also note that there are may OTC medicines, such as cold medicines and allergy medicines that my contain tylenol as well.  If you have any questions about medications and/or interactions please ask your doctor/PA or your pharmacist.   PAIN MEDICATION USE AND  EXPECTATIONS  You have likely been given narcotic medications to help control your pain.  After a traumatic event that results in an fracture (broken bone) with or without surgery, it is ok to use narcotic pain medications to help control one's pain.  We understand that everyone responds to pain differently and each individual patient will be evaluated on a regular basis for the continued need for narcotic medications. Ideally, narcotic medication use should last no more than 6-8 weeks (coinciding with fracture healing).   As a patient it is your responsibility as well to monitor narcotic medication use and report the amount and frequency you use these medications when you come to your office visit.   We would also advise that if you are using narcotic medications, you should take a dose prior to therapy to maximize you participation.  IF YOU ARE ON NARCOTIC MEDICATIONS IT IS NOT PERMISSIBLE TO OPERATE A MOTOR VEHICLE (MOTORCYCLE/CAR/TRUCK/MOPED) OR HEAVY MACHINERY DO NOT MIX NARCOTICS WITH OTHER CNS (CENTRAL NERVOUS SYSTEM) DEPRESSANTS SUCH AS ALCOHOL       ICE AND ELEVATE INJURED/OPERATIVE EXTREMITY  Using ice and elevating the injured extremity above your heart can help with swelling and pain control.  Icing in a pulsatile fashion, such as 20 minutes on and 20 minutes off, can be followed.    Do not place ice directly on skin. Make sure there is a barrier between to skin and the ice pack.    Using frozen items such as frozen peas works well as the conform nicely to the are that needs to be iced.  USE AN ACE WRAP OR TED HOSE FOR SWELLING CONTROL  In addition to icing and elevation, Ace  wraps or TED hose are used to help limit and resolve swelling.  It is recommended to use Ace wraps or TED hose until you are informed to stop.    When using Ace Wraps start the wrapping distally (farthest away from the body) and wrap proximally (closer to the body)   Example: If you had surgery on your leg or  thing and you do not have a splint on, start the ace wrap at the toes and work your way up to the thigh        If you had surgery on your upper extremity and do not have a splint on, start the ace wrap at your fingers and work your way up to the upper arm  IF YOU ARE IN A SPLINT OR CAST DO NOT REMOVE IT FOR ANY REASON   If your splint gets wet for any reason please contact the office immediately. You may shower in your splint or cast as long as you keep it dry.  This can be done by wrapping in a cast cover or garbage back (or similar)  Do Not stick any thing down your splint or cast such as pencils, money, or hangers to try and scratch yourself with.  If you feel itchy take benadryl as prescribed on the bottle for itching  CALL THE OFFICE WITH ANY QUESTIONS OR CONCERTS: [phone]     Discharge Pin Site Instructions  Dress pins daily with Kerlix roll starting on POD 2. Wrap the Kerlix so that it tamps the skin down around the pin-skin interface to prevent/limit motion of the skin relative to the pin.  (Pin-skin motion is the primary cause of pain and infection related to external fixator pin sites).  Remove any crust or coagulum that may obstruct drainage with a saline moistened gauze or soap and water.  After POD 3, if there is no discernable drainage on the pin site dressing, the interval for change can by increased to every other day.  You may shower with the fixator, cleaning all pin sites gently with soap and water.  If you have a surgical wound this needs to be completely dry and without drainage before showering.  The extremity can be lifted by the fixator to facilitate wound care and transfers.  Notify the office/Doctor if you experience increasing drainage, redness, or pain from a pin site, or if you notice purulent (thick, snot-like) drainage.  Discharge Wound Care Instructions  Do NOT apply any ointments, solutions or lotions to pin sites or surgical wounds.  These prevent  needed drainage and even though solutions like hydrogen peroxide kill bacteria, they also damage cells lining the pin sites that help fight infection.  Applying lotions or ointments can keep the wounds moist and can cause them to breakdown and open up as well. This can increase the risk for infection. When in doubt call the office.  Surgical incisions should be dressed daily.  If any drainage is noted, use one layer of adaptic, then gauze, Kerlix, and an ace wrap.  Once the incision is completely dry and without drainage, it may be left open to air out.  Showering may begin 36-48 hours later.  Cleaning gently with soap and water.  Traumatic wounds should be dressed daily as well.    One layer of adaptic, gauze, Kerlix, then ace wrap.  The adaptic can be discontinued once the draining has ceased    If you have a wet to dry dressing: wet the gauze with saline the  squeeze as much saline out so the gauze is moist (not soaking wet), place moistened gauze over wound, then place a dry gauze over the moist one, followed by Kerlix wrap, then ace wrap.
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
## 2011-05-20 NOTE — Progress Notes (Signed)
Nutrition Follow-up  Diet Order:  Regular  Pt s/p ORIF for left acetabula fx.  Pt intake has improved to 75-100% and is drinking 1-2 Ensure Complete/day.  Pt previously reported typically consuming 1 meal/day due to busyness at home, however since admission has been eating well at 3 meals/day.   Pt feels he has a good appetite and is eating well.  Feels eating 3 meals/day is healthier for him.    Pt has been able to get up with PT, and is planning to d/c to CIR.    Pt has wt loss goals which are appropriate for pt, however RD recommended initiating wt loss plan after he is well into rehab and able to incorporate more activity.  Encouraged intake for healing.   Meds: Scheduled Meds:   . diltiazem  120 mg Oral Daily  . enoxaparin  30 mg Subcutaneous Q12H  . feeding supplement  237 mL Oral BID BM  . ferrous sulfate  325 mg Oral TID WC  . methocarbamol  500-1,000 mg Oral QID   Or  . methocarbamol (ROBAXIN) IV  500 mg Intravenous QID  . off the beat book   Does not apply Once  . pantoprazole  40 mg Oral Q1200  . pregabalin  100 mg Oral TID  . senna  1 tablet Oral QHS  . vitamin C  500 mg Oral Daily  . warfarin  10 mg Oral ONCE-1800  . Warfarin - Pharmacist Dosing Inpatient   Does not apply q1800   Continuous Infusions:  PRN Meds:.acetaminophen, bisacodyl, diphenhydrAMINE, magnesium hydroxide, ondansetron (ZOFRAN) IV, ondansetron, oxyCODONE, oxyCODONE, oxyCODONE-acetaminophen  Labs:  CMP     Component Value Date/Time   NA 134* 05/17/2011 0525   K 4.2 05/17/2011 0525   CL 99 05/17/2011 0525   CO2 27 05/17/2011 0525   GLUCOSE 104* 05/17/2011 0525   BUN 15 05/17/2011 0525   CREATININE 0.76 05/17/2011 0525   CALCIUM 8.0* 05/17/2011 0525   PROT 4.5* 05/14/2011 0535   ALBUMIN 1.4* 05/14/2011 0535   AST 65* 05/14/2011 0535   ALT 46 05/14/2011 0535   ALKPHOS 84 05/14/2011 0535   BILITOT 6.2* 05/14/2011 0535   GFRNONAA >90 05/17/2011 0525   GFRAA >90 05/17/2011 0525     Intake/Output Summary  (Last 24 hours) at 05/20/11 1226 Last data filed at 05/20/11 0900  Gross per 24 hour  Intake    240 ml  Output   7875 ml  Net  -7635 ml    Weight Status:  No recent wt Last know wt: 240 lbs  Restatement of needs:  2200-2400 kcal, 110-120g protein  Nutrition Dx:  Inadequate oral intake, resolving.  Intervention:   1.  Supplements; continue Ensure Complete BID for now.  Pt intake at meals and snacks is likely adequate.  Monitor for need to continue Ensure particularly if pt experiences wt gain as pt has plans for wt loss s/p rehab.  Monitor:   1.  Food/Beverage; intake to continue >75% of meals with at least one Ensure BID. 2.  Wt/wt change; deter wt gain while providing adequate nutrition to support healing and activity in rehab.   Hoyt Koch Pager #:  [phone]

## 2011-05-20 NOTE — Progress Notes (Signed)
Utilization review completed. Jantz Main, RN, BSN.  05/20/11  

## 2011-05-20 NOTE — Progress Notes (Signed)
Rehab admissions - Evaluated for possible admission.  I spoke with patient who would like to come to inpatient rehab.  I did call in the case to [facility] and received approval for inpatient rehab.  I also called insurance verification to let them know that patient was agreeable to [place] opening the acute case with [employer] as well.  Bed available on rehab and can admit today if okay with MD.  Call me for questions.  #[phone]

## 2011-05-20 NOTE — Progress Notes (Signed)
Patient admitted to the unit approximately 1750 from 5000. Patient oriented to unit and questions answered. Wife at the bedside and present during admission. Patient signed safety agreement and placed in shadow chart.

## 2011-05-20 NOTE — Progress Notes (Signed)
Patient ID: [name], male   DOB: August 29, 1960, 51 y.o.   MRN: [id number]  8 Days Post-Op Subjective: Pt still urethral catheter indwelling.  Objective: Vital signs in last 24 hours: Temp:  [97.2 F (36.2 C)-99.1 F (37.3 C)] 98.7 F (37.1 C) (04/23 1300) Pulse Rate:  [85-88] 85  (04/23 1300) Resp:  [18-20] 18  (04/23 1300) BP: (112-122)/(60-65) 122/65 mmHg (04/23 1300) SpO2:  [97 %-100 %] 99 % (04/23 1300)  Intake/Output from previous day: 04/22 0701 - 04/23 0700 In: 480 [P.O.:480] Out: 7175 [Urine:7175] Intake/Output this shift: Total I/O In: -  Out: 1600 [Urine:1600]  Physical Exam:  GU: Scrotum with significant edema still but certainly improved from last week.  Penis is still completely hidden due to edema.    Assessment/Plan: 1) Scrotal edema: Improved but still significant.  Considering current ambulatory status and significance of edema, I would recommend catheter be left indwelling right now. Patient is planning on transfer to inpatient rehab and I will plan to check on him next week to reassess.  If he is discharged from inpatient hospital status, he should followup in about 2 weeks for further outpatient assessment.   LOS: 15 days   Almira Phetteplace,LES 05/20/2011, 2:52 PM

## 2011-05-21 DIAGNOSIS — S82109A Unspecified fracture of upper end of unspecified tibia, initial encounter for closed fracture: Secondary | ICD-10-CM

## 2011-05-21 DIAGNOSIS — Z5189 Encounter for other specified aftercare: Secondary | ICD-10-CM

## 2011-05-21 LAB — PROTIME-INR
INR: 2.32 — ABNORMAL HIGH (ref 0.00–1.49)
Prothrombin Time: 25.9 seconds — ABNORMAL HIGH (ref 11.6–15.2)

## 2011-05-21 MED ORDER — WARFARIN SODIUM 7.5 MG PO TABS
7.5000 mg | ORAL_TABLET | Freq: Once | ORAL | Status: AC
  Administered 2011-05-21: 7.5 mg via ORAL
  Filled 2011-05-21: qty 1

## 2011-05-21 MED ORDER — OXYCODONE HCL 15 MG PO TB12
30.0000 mg | ORAL_TABLET | Freq: Two times a day (BID) | ORAL | Status: DC
  Administered 2011-05-21 – 2011-05-29 (×16): 30 mg via ORAL
  Filled 2011-05-21 (×16): qty 2

## 2011-05-21 MED ORDER — NYSTATIN 100000 UNIT/GM EX POWD
Freq: Two times a day (BID) | CUTANEOUS | Status: DC
  Administered 2011-05-21 – 2011-05-29 (×16): via TOPICAL
  Filled 2011-05-21: qty 15
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
## 2011-05-21 NOTE — Progress Notes (Signed)
Orthopedic Tech Progress Note Patient Details:  [name] 20-Sep-1960 [id number] TRAPEZE BAR Patient ID: [name], male   DOB: [birth year], 51 y.o.   MRN: [id number]   [name] 05/21/2011, 4:11 PM
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
## 2011-05-21 NOTE — Progress Notes (Signed)
Physical Therapy Session Note  Patient Details  Name: [name] MRN: [id number] Date of Birth: 04/18/1960  Today's Date: 05/21/2011 Time: 6962-9528 Time Calculation (min): 43 min  Short Term Goals: Week 1:  PT Short Term Goal 1 (Week 1): pt will transfer with sliding board and supervision on surfaces without armrests PT Short Term Goal 2 (Week 1): pt will be able to perform car transfers with mod A  Skilled Therapeutic Interventions/Progress Updates:   Switched pt's w/c to a wider one to allow correct alignment of the LLE when seated in w/c.  Sliding board transfers w/c to mat/bed with min@, mainly for board placement.  Pt requires extra time to perform.  Sit to stand from an almost standing elevated surface with min@ and RW x of standing before needing to sit down.  Stand pivot with RW from elevated mat to w/c with min@.  Throughout treatment would have to stop for brief periods of time due to LLE pain.  Orthotist checked fit of AFO/splint with therapist.  Therapy Documentation Precautions:  Precautions Precautions: Posterior Hip;Fall Precaution Comments: L posterior hip Required Braces or Orthoses: Other Brace/Splint Other Brace/Splint: L hinged AFO  Restrictions Weight Bearing Restrictions: Yes LLE Weight Bearing: Touchdown weight bearing (LLE) Pain: Pain Assessment Pain Assessment:  (LLE pain not rated, pain with different positions) Locomotion :  w/c propulsion on unit with supervision x 120'.  See FIM for current functional status  Therapy/Group: Individual Therapy  Georges Mouse 05/21/2011, 4:24 PM
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
## 2011-05-21 NOTE — Progress Notes (Signed)
Occupational Therapy Session Note  Patient Details  Name: [name] MRN: [id number] Date of Birth: [birth year]  Today's Date: 05/21/2011 Time: 1330-1400 Time Calculation (min): 30 min  Short Term Goals: Week 1:  OT Short Term Goal 1 (Week 1): Pt will transfer to toilet with min A OT Short Term Goal 2 (Week 1): Pt will bathe 10/10 with AE with min A OT Short Term Goal 3 (Week 1): Pt will perform sit to stand for pericare with mod A OT Short Term Goal 4 (Week 1): Pt will perform bed mobility in prep for ADL at sink level with min a  Skilled Therapeutic Interventions/Progress Updates:  Balance/vestibular training;Community reintegration;Neuromuscular re-education;Psychosocial support;Therapeutic Exercise;UE/LE Coordination activities;UE/LE Strength taining/ROM;Self Care/advanced ADL retraining;Patient/family education;Functional mobility training;Discharge planning;DME/adaptive equipment instruction;Therapeutic Activities;Splinting/orthotics;Wheelchair propulsion/positioning   1:1 focus on basic transfers, w/c to mat with scoot pivot with min a (very labor some with extra time and increased pain in left foot), mat to w/c with slide board decreasing pain and less labor some. Adjusted pt in a new wider w/c to decrease left hip external rotation and knee torque for better positioning. Performed all functional mobility with left AFO on without complaints.  Therapy Documentation Precautions:  Precautions Precautions: Posterior Hip;Fall Precaution Comments: L posterior hip Required Braces or Orthoses: Other Brace/Splint Other Brace/Splint: L hinged AFO  Restrictions Weight Bearing Restrictions: Yes LLE Weight Bearing: Touchdown weight bearing (LLE) Pain: Pain Assessment Pain Assessment: 0-10 Pain Score:   6 Pain Type: Acute pain Pain Location: Leg Pain Orientation: Left Pain Descriptors: Aching Pain Onset: On-going Pain Intervention(s): Medication (See eMAR)  See FIM for current  functional status  Therapy/Group: Individual Therapy  Roney Mans [facility] 05/21/2011, 2:46 PM
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
## 2011-05-22 DIAGNOSIS — R609 Edema, unspecified: Secondary | ICD-10-CM

## 2011-05-22 LAB — PROTIME-INR
INR: 2.85 — ABNORMAL HIGH (ref 0.00–1.49)
Prothrombin Time: 30.4 seconds — ABNORMAL HIGH (ref 11.6–15.2)

## 2011-05-22 MED ORDER — WARFARIN SODIUM 5 MG PO TABS
5.0000 mg | ORAL_TABLET | Freq: Once | ORAL | Status: AC
  Administered 2011-05-22: 5 mg via ORAL
  Filled 2011-05-22: qty 1
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
## 2011-05-22 NOTE — Progress Notes (Signed)
Occupational Therapy Session Note  Patient Details  Name: [name] MRN: [id number] Date of Birth: [birth year]  Today's Date: 05/22/2011 Time: 0730-0830 Time Calculation (min): 60 min  Short Term Goals: Week 1:  OT Short Term Goal 1 (Week 1): Pt will transfer to toilet with min A OT Short Term Goal 2 (Week 1): Pt will bathe 10/10 with AE with min A OT Short Term Goal 3 (Week 1): Pt will perform sit to stand for pericare with mod A OT Short Term Goal 4 (Week 1): Pt will perform bed mobility in prep for ADL at sink level with min a  Skilled Therapeutic Interventions/Progress Updates:    1:1 self care retraining at EOB, focusing on getting to EOB without use of trapeze bar above head, sitting EOB without UE support for balance or pain management, activating left LE/hip muscles for adduction of LE to decrease ER (able to perform better in supine than he could in sitting), scoot transfer bed to w/c with steadying A, donned AFO for support and heel cord stretch.   Therapy Documentation Precautions:  Precautions Precautions: Posterior Hip;Fall Precaution Comments: L posterior hip Required Braces or Orthoses: Other Brace/Splint Other Brace/Splint: L hinged AFO  Restrictions Weight Bearing Restrictions: Yes LLE Weight Bearing: Touchdown weight bearing Pain: Pain Assessment Pain Score:   7 RN came in a gave meds, assisted with repositioning  See FIM for current functional status  Therapy/Group: Individual Therapy  Roney Mans [facility] 05/22/2011, 11:16 AM

## 2011-05-22 NOTE — Plan of Care (Signed)
Problem: RH PAIN MANAGEMENT Goal: RH STG PAIN MANAGED AT OR BELOW PT'S PAIN GOAL Pain goal less or equal to 5  Outcome: Not Progressing Currently pain rating 6 or more despite prn and scheduled pain medication

## 2011-05-22 NOTE — Progress Notes (Signed)
Physical Therapy Note  Patient Details  Name: JANIE STROTHMAN MRN: [id number] Date of Birth: 1961-01-24 Today's Date: 05/22/2011  Time: 4098-1191 60 minutes  Pt c/o pain 7/10 at beginning of session; RN provided medications.  Pt propelled w/c x120 feet in controlled environment with supervision.  Min A for management of L LE, leg rests to prevent breaking hip precautions.  Sliding board transfer with setup and supervision to mat.  Pt able to position L LE with cuing and min A with use of B UEs.  Practiced stand pivot transfers x3 with min A for stand from elevated surface, mod A from standard height surface, cuing for TTWB on L and UE placement.  Standing balance up to 2 minutes x 4 attempts with L UE only during horseshoe game. Will continue to focus on stand pivot transfers for mobility vs. sliding board transfers.  Pt with good motivation and participation in therapy.  Individual therapy Co-treat with recreational therapy   Lockie Pares 05/22/2011, 10:10 AM

## 2011-05-22 NOTE — Progress Notes (Signed)
*  PRELIMINARY RESULTS* Vascular Ultrasound Bilateral lower extremity venous duplex has been completed.   No evidence of lower extremity deep vein thrombosis bilaterally. All veins compressible.  Malachy Moan, RDMS, RDCS 05/22/2011, 4:52 PM

## 2011-05-22 NOTE — Care Management Note (Signed)
[facility] Individual Statement of Services  Patient Name:  [name]  Date:  05/22/2011  Welcome to the [facility].  Our goal is to provide you with an individualized program based on your diagnosis and situation, designed to meet your specific needs.  With this comprehensive rehabilitation program, you will be expected to participate in at least 3 hours of rehabilitation therapies Monday-Friday, with modified therapy programming on the weekends.  Your rehabilitation program will include the following services:  Physical Therapy (PT), Occupational Therapy (OT), 24 hour per day rehabilitation nursing, Therapeutic Recreaction (TR), Case Management (RN and Child psychotherapist), Rehabilitation Medicine, Nutrition Services and Pharmacy Services  Weekly team conferences will be held on  Tuesday  to discuss your progress.  Your RN Case Designer, television/film set will talk with you frequently to get your input and to update you on team discussions.  Team conferences with you and your family in attendance may also be held.  Expected length of stay: 10-14 days  Overall anticipated outcome: Supervision-Min assist  Depending on your progress and recovery, your program may change.  Your RN Case Estate agent will coordinate services and will keep you informed of any changes.  Your RN Sports coach and SW names and contact numbers are listed  below.  The following services may also be recommended but are not provided by the [facility]:   Driving Evaluations  Home Health Rehabiltiation Services  Outpatient Rehabilitatation [facility]  Vocational Rehabilitation   Arrangements will be made to provide these services after discharge if needed.  Arrangements include referral to agencies that provide these services.  Your insurance has been verified to be:  [place] Your primary doctor is:    Pertinent information will be shared with your  doctor and your insurance company.  Case Manager: Melanee Spry, [facility] [phone]  Social Worker:  [place], [place] [phone]  Information discussed with and copy given to patient by: Brock Ra, 05/22/2011, 4:42 PM

## 2011-05-22 NOTE — Progress Notes (Signed)
Occupational Therapy Session Note  Patient Details  Name: [name] MRN: [id number] Date of Birth: Jun 19, 1960  Today's Date: 05/22/2011 Time: 1330-1400 Time Calculation (min): 30 min  Short Term Goals: Week 1:  OT Short Term Goal 1 (Week 1): Pt will transfer to toilet with min A OT Short Term Goal 2 (Week 1): Pt will bathe 10/10 with AE with min A OT Short Term Goal 3 (Week 1): Pt will perform sit to stand for pericare with mod A OT Short Term Goal 4 (Week 1): Pt will perform bed mobility in prep for ADL at sink level with min a  Skilled Therapeutic Interventions/Progress Updates:    1:1 self care retraining focus on toilet transfer to drop arm commode (scoot pivot), able to pull down pants with lateral leans and stand pivot with RW pushing up from commode arm rest to w/c steadying A, while standing total A for clothing management. Goal to use this commode outside of bathroom at home with wife's assistance  Therapy Documentation Precautions:  Precautions Precautions: Posterior Hip;Fall Precaution Comments: L posterior hip Required Braces or Orthoses: Other Brace/Splint Other Brace/Splint: L hinged AFO  Restrictions Weight Bearing Restrictions: Yes LLE Weight Bearing: Touchdown weight bearing Pain: Pain Assessment Pain Score:   6  See FIM for current functional status  Therapy/Group: Individual Therapy  Roney Mans [facility] 05/22/2011, 3:32 PM
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
## 2011-05-22 NOTE — Plan of Care (Signed)
Alert and orientated x 4. Continent of bowel and bladder 05/21/11. LLE and scrotum edemaous. Abdominal incision with staples approximated, redness along incision line, with dry dressing intact. L hip incision with small opening to proximal end of the incision with moderate amount of serous drainage. Area clean, dressing reapplied. Pain not managed despite scheduled Robaxin 1500mg   QID, Oxycontin 30mg  q 12hrs, Lyrica 100mg  tid, Oxy IR 15mg  q 3hrs., prn. Pt reports pain 7/8 most of shift. TDWB L for transfers only. Posterior total hip precaution. L prafo/afo in use.

## 2011-05-22 NOTE — Progress Notes (Signed)
Physical Therapy Note  Patient Details  Name: [name] MRN: [id number] Date of Birth: 1960-11-20 Today's Date: 05/22/2011  Time: 1400-1445 45 minutes  Pt c/o pain 6/10 in L LE; RN provided medication during session.  Propelling throughout facility in w/c with supervision.  Stand-pivot transfers with min A, verbal cuing for hip precautions.  Practiced bed mobility with modified long sit technique and use of sheet to assist with leg lifting; pt required min A for sit>supine transfer and mod A at trunk for sup>sit.  AAROM Ther ex in supine with SAQ, heel slides, and IR with mod A on L for maintaining leg in neutral.  Pt is very motivated to participate in therapy.  Individual therapy  Lockie Pares 05/22/2011, 3:54 PM

## 2011-05-23 LAB — PROTIME-INR: Prothrombin Time: 33.9 seconds — ABNORMAL HIGH (ref 11.6–15.2)

## 2011-05-23 MED ORDER — WARFARIN SODIUM 1 MG PO TABS
1.0000 mg | ORAL_TABLET | Freq: Once | ORAL | Status: AC
  Administered 2011-05-23: 1 mg via ORAL
  Filled 2011-05-23: qty 1
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
## 2011-05-23 NOTE — Progress Notes (Signed)
Physical Therapy Session Note  Patient Details  Name: [name] MRN: [id number] Date of Birth: Feb 27, 1960  Today's Date: 05/23/2011 Time: 2952-8413 Time Calculation (min): 30 min  Short Term Goals: Week 1:  PT Short Term Goal 1 (Week 1): pt will transfer with sliding board and supervision on surfaces without armrests PT Short Term Goal 2 (Week 1): pt will be able to perform car transfers with mod A  Skilled Therapeutic Interventions/Progress Updates:    There-ex to increase strength and active movement LLE to aid mobility; educated on push stroke in w/c to improve efficiency and decrease risk of shoulder problems  Therapy Documentation Precautions:  Precautions Precautions: Posterior Hip;Fall Precaution Comments: L posterior hip Required Braces or Orthoses: Other Brace/Splint Other Brace/Splint: L hinged AFO  Restrictions Weight Bearing Restrictions: Yes LLE Weight Bearing: Touchdown weight bearing Pain:4/10 in LLE, premedicated and received more at end of session Mobility: Transfers Sit to Stand: 3: Mod assist;With armrests;With upper extremity assist (from w/c to table) Stand to Sit: 4: Min assist Stand to Sit Details: min A to scoot leg out Balance: static standing with S and BUE support, maintains weight bearing status   Exercises:seated active assist knee extension and flexion 5 x 2, passive stretch to knee achieving 87 degree flexion, passive stretch to calf at knee and ankle, active PF, able to activate inverters minimally but no DF or evertors; standing practiced moving LLE forward and back to aid sit to stand with transfers, needs A to move LE forward even with no shoe, stadning glut sets x 10   :    See FIM for current functional status  Therapy/Group: Individual Therapyg  Michaelene Song 05/23/2011, 11:37 AM

## 2011-05-23 NOTE — Progress Notes (Signed)
Physical Therapy Note  Patient Details  Name: DESMOND SZABO MRN: [id number] Date of Birth: Feb 01, 1960 Today's Date: 05/23/2011  Time: 0800-0900 60 minutes  Pt c/o stabbing pain in L foot; RN provided medication during treatment.  Sup>sit transfer with supervision and HOB elevated.  Stand-pivot transfers with supervision from high surfaces up to +2 mod A for low surfaces.  Practiced car transfers with cuing for sequencing and technique.  Scoot pivot transfer w/c>couch with cuing for management of leg rests and L LE, min A for transfer for safety only as pt had difficulty clearly bottom over wheel and managing LE positioning.  Propelled w/c through controlled environment and around obstacles, through narrow doorways, over thresholds and across carpet with supervision.  Pt limited in independence with transfers with little active ROM in L LE for positioning and w/c parts management.  Individual therapy  Lockie Pares 05/23/2011, 10:33 AM

## 2011-05-23 NOTE — Progress Notes (Signed)
Occupational Therapy Note  Patient Details  Name: [name] MRN: [id number] Date of Birth: [birth year] Today's Date: 05/23/2011  Time: 1330-1400 Pain: 6/10 left leg; RN aware Individual Therapy  Pt practiced stand pivot transfer to [facility] with min A.  Pt initially said he wanted to place [facility] inside bathroom door and pull the wheel chair up to door and take a couple of steps.  Reminded pt that he was only cleared for transfers with no ambulation.  Introduced tub bench to patient but again we discussed that using such equipment was a few months in the future.  Lavone Neri [facility] 05/23/2011, 2:39 PM

## 2011-05-23 NOTE — Progress Notes (Addendum)
Noted patient's left hip incision at proximal end with small opening moderate amount of serosanguinous drainage. Redness noted along the incision line. Cleansed area, and replace foam dressing. Patient repositioned in bed with pillow placed underneath left hip to relieve pressure.  Will continue to monitor.

## 2011-05-23 NOTE — Progress Notes (Signed)
Occupational Therapy Session Note  Patient Details  Name: [name] MRN: [id number] Date of Birth: Jul 29, 1960  Today's Date: 05/23/2011 Time: 1000-1100  Short Term Goals: Week 1:  OT Short Term Goal 1 (Week 1): Pt will transfer to toilet with min A OT Short Term Goal 2 (Week 1): Pt will bathe 10/10 with AE with min A OT Short Term Goal 3 (Week 1): Pt will perform sit to stand for pericare with mod A OT Short Term Goal 4 (Week 1): Pt will perform bed mobility in prep for ADL at sink level with min a  Skilled Therapeutic Interventions/Progress Updates:    Pt engaged in ADL retraining including bathing and dressing w/c level at sink with sit to stand to bathe buttocks and pull up pants.  Pt required mod A for sit to stand but steady assist when standing.  Pt requires assistance moving/managing LLE to don pants.  Reacher and sock aid introduced and pt demonstrated independence with use.  Focus on AE use, sit to stand, and standing balance.  Therapy Documentation Precautions:  Precautions Precautions: Posterior Hip;Fall Precaution Comments: L posterior hip Required Braces or Orthoses: Other Brace/Splint Other Brace/Splint: L hinged AFO  Restrictions Weight Bearing Restrictions: Yes LLE Weight Bearing: Touchdown weight bearing   Pain: Pain Assessment Pain Assessment: 0-10 Pain Score:   6 Pain Type: Acute pain Pain Location: Leg Pain Orientation: Left Pain Descriptors: Aching Pain Onset: On-going Patients Stated Pain Goal: 2 Pain Intervention(s): RN made aware  See FIM for current functional status  Therapy/Group: Individual Therapy  Rich Brave 05/23/2011, 12:59 PM

## 2011-05-23 NOTE — Progress Notes (Signed)
Orthopedic Tech Progress Note Patient Details:  [name] 1960/03/08 [id number]  Other Ortho Devices Type of Ortho Device: Knee Immobilizer Ortho Device Interventions: Application   Cammer, Mickie Bail 05/23/2011, 10:36 AM
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
## 2011-05-23 NOTE — Progress Notes (Signed)
Occupational Therapy Session Note  Patient Details  Name: [name] MRN: [id number] Date of Birth: 07-04-1960  Today's Date: 05/23/2011 Time: 1415-1500 Time Calculation (min): 45 min  Short Term Goals: Week 1:  OT Short Term Goal 1 (Week 1): Pt will transfer to toilet with min A OT Short Term Goal 2 (Week 1): Pt will bathe 10/10 with AE with min A OT Short Term Goal 3 (Week 1): Pt will perform sit to stand for pericare with mod A OT Short Term Goal 4 (Week 1): Pt will perform bed mobility in prep for ADL at sink level with min a  Skilled Therapeutic Interventions/Progress Updates:    1:1 Focused on stand pivot transfers with RW w/c to mat, sit to supine with exercises for left LE (gravity assist) to work on hip internal rotation and adduction to come to neutral at hip and knee, active assisted ankle pumps and knee flexion for ROM with hip internal rotation to neurtral- in prep for better being able to manage left LE with transfers and in functional tasks (ie bathing, dressing, toileting, and sitting posture).  Therapy Documentation Precautions:  Precautions Precautions: Posterior Hip;Fall Precaution Comments: L posterior hip Required Braces or Orthoses: Other Brace/Splint Other Brace/Splint: L hinged AFO  Restrictions Weight Bearing Restrictions: Yes LLE Weight Bearing: Touchdown weight bearing Pain: Pain Assessment Pain Assessment: 0-10 Pain Score:   5 Pain Type: Acute pain Pain Location: Leg Pain Orientation: Left Pain Descriptors: Aching Pain Onset: On-going Patients Stated Pain Goal: 2 Pain Intervention(s): RN made aware  See FIM for current functional status  Therapy/Group: Individual Therapy  Roney Mans [facility] 05/23/2011, 4:32 PM

## 2011-05-24 LAB — PROTIME-INR
INR: 3.05 — ABNORMAL HIGH (ref 0.00–1.49)
Prothrombin Time: 32 seconds — ABNORMAL HIGH (ref 11.6–15.2)

## 2011-05-24 MED ORDER — WARFARIN SODIUM 2 MG PO TABS
2.0000 mg | ORAL_TABLET | Freq: Once | ORAL | Status: AC
  Administered 2011-05-24: 2 mg via ORAL
  Filled 2011-05-24 (×2): qty 1

## 2011-05-24 NOTE — Progress Notes (Signed)
Physical Therapy Note  Patient Details  Name: ZADIN LANGE MRN: [id number] Date of Birth: [birth year] Today's Date: 05/24/2011 Time: 5284-1324 (30') Pain; L Lower Leg 0-5 variable through treatment session Precautions: Posterior Hip;Fall, L posterior hip  Required Braces or Orthoses: Other; L hinged AFO  Restrictions .Marland KitchenWeight Bearing Restrictions: Yes ,  LLE Weight Bearing: Touchdown weight bearing  Wheelchair Management: (15') propelled manual w/c 1,000' in hallways, elevator, exiting building and onto brick exterior walkway, also managed foley line in w/c and able to apply brakes Gait Training: (15') using RW x 15' with min-A for initial standing balance from transfer into standing then S/Mod-I with verbal cues for safety  Individual Therapy Session   Rex Kras 05/24/2011, 5:15 PM

## 2011-05-24 NOTE — Progress Notes (Signed)
Physical Therapy Note  Patient Details  Name: [name] MRN: [id number] Date of Birth: 1960/02/15 Today's Date: 05/24/2011  1000-1055 (55 minutes) group Pain: no complaint of pain Pt participated in PT group focusing on bilateral UE strengthening/endurance performing the following : UE ergonometer Level 2 Random program X 10 minutes; bilateral triceps curls, biceps curls , shoulder flexion, shoulder abduction, wrist curls in pronation and supination using 5 Lb. With 2 sets of 15 reps.   Kairie Vangieson,JIM 05/24/2011, 11:48 AM
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
## 2011-05-24 NOTE — Progress Notes (Signed)
Occupational Therapy Session Note  Patient Details  Name: [name] MRN: [id number] Date of Birth: 1960-02-24  Today's Date: 05/24/2011  Session #1  Time: 0900-1000 Time Calculation (min): 60 min  Short Term Goals: Week 1:  OT Short Term Goal 1 (Week 1): Pt will transfer to toilet with min A OT Short Term Goal 2 (Week 1): Pt will bathe 10/10 with AE with min A OT Short Term Goal 3 (Week 1): Pt will perform sit to stand for pericare with mod A OT Short Term Goal 4 (Week 1): Pt will perform bed mobility in prep for ADL at sink level with min a  Skilled Therapeutic Interventions: ADL-retraining seated/standing at sink side with emphasis on performing bed mobility and pericare (standing at sink side).  Patient required only verbal cues to adjust bed and min assist to move left leg during bed mobility.   He stood when cued at sink side and complete pericare and cleansing of buttocks with min assist (steadying).   Patient required total assist to empty foley cath bag due to need to record urine output.  Patient was motivated to progress through treatment and demo's good carry over of prior training.    Therapy Documentation Precautions:  Precautions Precautions: Posterior Hip;Fall Precaution Comments: L posterior hip Required Braces or Orthoses: Other Brace/Splint Other Brace/Splint: L hinged AFO  Restrictions Weight Bearing Restrictions: Yes LUE Weight Bearing: Weight bearing as tolerated LLE Weight Bearing: Non weight bearing  Pain: Pain Assessment Pain Assessment: 0-10 Pain Score:   7 Pain Type: Surgical pain Pain Location: Hip Pain Orientation: Proximal Pain Descriptors: Radiating Pain Frequency: Intermittent Pain Onset: On-going Patients Stated Pain Goal: 3 Pain Intervention(s): Repositioned    Therapy: Individual Therapy  Session #2  Time: 1420-1500 Time Calculation (min): 45 min  Pain: 6/10  Skilled Therapeutic Interventions: Therapeutic activities with  emphasis on pain management, activity tolerance, dynamic standing balance, and functional transfers using RW while maintaining WB precautions.   Patient complete 3 games of horseshoes (preferred activity), tossing 10 shoes 3X, resting in between sets approx 1-2 minutes.  Patient propelled w/c to gym and back to room independently and verbalized satisfaction with activity as beneficial to reduce episodes of paresthesia at left LE from prolonged sitting in w/c.  Therapy: Individual Therapy  Georgeanne Nim 05/24/2011, 4:08 PM
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
## 2011-05-25 MED ORDER — WARFARIN SODIUM 5 MG PO TABS
5.0000 mg | ORAL_TABLET | Freq: Once | ORAL | Status: AC
  Administered 2011-05-25: 5 mg via ORAL
  Filled 2011-05-25: qty 1
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
## 2011-05-25 NOTE — Progress Notes (Signed)
Physical Therapy Note  Patient Details  Name: [name] MRN: [id number] Date of Birth: 08/25/1960 Today's Date: 05/25/2011  1350-1430 (40 minutes) individual Pain: 6/10 Lt hip/foot /premedicated Focus of treatment: therapeutic exercises as permitted LT LE and general UEs exercise (ergonometer) to improve tolerance to activity. Treatment: Transfers: SPT RW SBA TDWB LT LE X 2 wc<>mat; sit to supine min assist LT LE; bilateral quad sets, glut sets; supine to sit min assist LT LE only; sit to stand to RW X 3 for RT quad strengthening; UE ergonometer X 10 minutes Level 2 Random program ; Perceived exertion post 10 minutes =    Ziad Maye,JIM 05/25/2011, 2:21 PM

## 2011-05-26 LAB — URINALYSIS, ROUTINE W REFLEX MICROSCOPIC
Bilirubin Urine: NEGATIVE
Ketones, ur: NEGATIVE mg/dL
Nitrite: NEGATIVE
Protein, ur: NEGATIVE mg/dL
Urobilinogen, UA: 1 mg/dL (ref 0.0–1.0)

## 2011-05-26 MED ORDER — CEPHALEXIN 500 MG PO CAPS
500.0000 mg | ORAL_CAPSULE | Freq: Three times a day (TID) | ORAL | Status: DC
  Administered 2011-05-26: 500 mg via ORAL
  Filled 2011-05-26 (×4): qty 1

## 2011-05-26 MED ORDER — CEPHALEXIN 500 MG PO CAPS
500.0000 mg | ORAL_CAPSULE | Freq: Four times a day (QID) | ORAL | Status: DC
  Administered 2011-05-26 – 2011-05-28 (×8): 500 mg via ORAL
  Filled 2011-05-26 (×12): qty 1

## 2011-05-26 MED ORDER — WARFARIN SODIUM 5 MG PO TABS
5.0000 mg | ORAL_TABLET | Freq: Once | ORAL | Status: AC
  Administered 2011-05-26: 5 mg via ORAL
  Filled 2011-05-26: qty 1

## 2011-05-26 NOTE — Progress Notes (Signed)
Occupational Therapy Session Note  Patient Details  Name: [name] MRN: [id number] Date of Birth: [birth year]  Today's Date: 05/26/2011 Time: 1000-1100 Time Calculation (min): 60 min  Short Term Goals: Week 1:  OT Short Term Goal 1 (Week 1): Pt will transfer to toilet with min A OT Short Term Goal 2 (Week 1): Pt will bathe 10/10 with AE with min A OT Short Term Goal 3 (Week 1): Pt will perform sit to stand for pericare with mod A OT Short Term Goal 4 (Week 1): Pt will perform bed mobility in prep for ADL at sink level with min a  Skilled Therapeutic Interventions/Progress Updates:    1:1 self care retraining at sink level focus on using AE for LB bathing and dressing, sit to stand with RW for peri hygiene and clothing management, activity tolerance, standing balance, problem solving home environment and setup for performing ADLs, importance of cleaning around catheter to prevention of infections.  Therapy Documentation Precautions:  Precautions Precautions: Posterior Hip;Fall Precaution Comments: L posterior hip Required Braces or Orthoses: Other Brace/Splint Other Brace/Splint: L hinged AFO  Restrictions Weight Bearing Restrictions: Yes LUE Weight Bearing: Weight bearing as tolerated LLE Weight Bearing: Non weight bearing Pain: Pain Assessment Pain Assessment: 0-10 Pain Score:   6 Pain Type: Acute pain Pain Location: Leg Pain Orientation: Left Pain Descriptors: Aching;Discomfort;Heaviness;Sore Pain Frequency: Occasional Pain Onset: Gradual Patients Stated Pain Goal: 3 Pain Intervention(s): Medication (See eMAR) (oxycodone 15mg  )  See FIM for current functional status  Therapy/Group: Individual Therapy  Roney Mans [facility] 05/26/2011, 12:40 PM
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
## 2011-05-26 NOTE — Progress Notes (Signed)
Patient complaining of left toes and foot burning while laying in bed . Repositioned left leg + pedal pulse . Warm to touch on foot . prafo boot intact .   Continue with plan of care.                                                   Cleotilde Neer
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
## 2011-05-26 NOTE — Progress Notes (Signed)
Physical Therapy Note  Patient Details  Name: ZAYVEON RASCHKE MRN: [id number] Date of Birth: 1961-01-24 Today's Date: 05/26/2011  Time: 900-955 55 minutes  Pt with c/o L LE pain, RN made aware, pt pre-medicated.  W/c mobility in controlled environment with mod I, ramp training with w/c with supervision, min A for steering in tight spaces.  Sit to stand training for strength and activity tolerance.  Pt has progressed to supervision consistently with sit to stands.  SPT transfers with RW with min A to stand, supervision for transfer.  Pt requires assist for foot placement for safe transfer.  Seated knee flex/ext sliding on maxi slide with AAROM, pt requires assist for HS activation.  Supine TE for edema control and AAROM of LLE.  SAQ, heel slides and hip IR/add in safe range.  Pt is increasing strength but still requires assist for all supine TE.  Individual therapy   Kenidee Cregan 05/26/2011, 4:32 PM

## 2011-05-26 NOTE — Progress Notes (Signed)
Physical Therapy Note  Patient Details  Name: YVON MCCORD MRN: [id number] Date of Birth: 1960-04-05 Today's Date: 05/26/2011  14:30-15:15 individual therapy 6/10 pain. Left leg was elevated including thigh, ice applied, and rom performed.  Performed scoot transfer with min assist to stabilize wc and for left leg. Initial sit to supine with min assist for left leg progressing to supervision with vc for hip position and use of leg lifter.  AAROM for heel slides, ankle motions, and SAQ. Daughter was taught how to assist pt with SAQ and leg elevation, pt was taught how to stretch heel cord with leg lifter.     Julian Reil 05/26/2011, 3:50 PM

## 2011-05-26 NOTE — Plan of Care (Signed)
Problem: RH SKIN INTEGRITY Goal: RH STG MAINTAIN SKIN INTEGRITY WITH ASSISTANCE STG Maintain Skin Integrity With modified independence  Outcome: Not Progressing Patient unable to reach left hip incision . Will educate patient's wife.

## 2011-05-26 NOTE — Progress Notes (Signed)
Occupational Therapy Session Note  Patient Details  Name: [name] MRN: [id number] Date of Birth: 1960-09-14  Today's Date: 05/26/2011 Time: 6387-5643 Time Calculation (min): 30 min  Short Term Goals: Week 1:  OT Short Term Goal 1 (Week 1): Pt will transfer to toilet with min A OT Short Term Goal 2 (Week 1): Pt will bathe 10/10 with AE with min A OT Short Term Goal 3 (Week 1): Pt will perform sit to stand for pericare with mod A OT Short Term Goal 4 (Week 1): Pt will perform bed mobility in prep for ADL at sink level with min a  Skilled Therapeutic Interventions/Progress Updates:    1:1 Focus on transfers w/c <>bed with RW stand pivot, bed mobility with rigid leg lifter with success while maintaining posterior hip precautions, ROM exercises for ankle and knee to reduce swelling.   Therapy Documentation Precautions:  Precautions Precautions: Posterior Hip;Fall Precaution Comments: L posterior hip Required Braces or Orthoses: Other Brace/Splint Other Brace/Splint: L hinged AFO  Restrictions Weight Bearing Restrictions: Yes LUE Weight Bearing: Weight bearing as tolerated LLE Weight Bearing: Non weight bearing    Pain: Pain Assessment Pain Score:   7 Pain Type: Acute pain Pain Location: Leg Pain Orientation: Left Pain Descriptors: Aching;Sore Pain Frequency: Occasional Pain Onset: Gradual Patients Stated Pain Goal: 3 Pain Intervention(s): Medication (See eMAR) (oxycodone 15mg )  See FIM for current functional status  Therapy/Group: Individual Therapy  Roney Mans [name] [facility] 05/26/2011, 4:04 PM
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
## 2011-05-27 DIAGNOSIS — S82109A Unspecified fracture of upper end of unspecified tibia, initial encounter for closed fracture: Secondary | ICD-10-CM

## 2011-05-27 DIAGNOSIS — Z5189 Encounter for other specified aftercare: Secondary | ICD-10-CM

## 2011-05-27 MED ORDER — WARFARIN SODIUM 5 MG PO TABS
5.0000 mg | ORAL_TABLET | Freq: Once | ORAL | Status: AC
  Administered 2011-05-27: 5 mg via ORAL
  Filled 2011-05-27: qty 1
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
## 2011-05-27 NOTE — Progress Notes (Signed)
Physical Therapy Note  Patient Details  Name: ERYK BEAVERS MRN: [id number] Date of Birth: February 23, 1960 Today's Date: 05/27/2011  Time: 0900-1045 105 minutes  Pt c/o numbness, burning in bottom of L foot; relieved by removal of hinged AFO.  W/c mobility indoors/outdoors 500+ with supervision, min cuing for safety over unlevel surfaces.  Stand-pivot transfers with RW and supervision, min A for management of leg rests/L LE.  Ther ex in sitting: L heel slides with AAROM, sit to stands.  Supine SLR on R; AAROM IR, heel slides on L.  Pt educated on how to use leg lifter to self assist with AAROM for all exercises.  Pt also supervision with bed mobility now with use of leg lifter.  Bed mobility in ADL apartment with mod cuing and supervision, min A for w/c management.  Pt is overall at a supervision level, progressing well with therapy with increased activity tolerance and independence with all mobility.  Individual therapy  Lockie Pares 05/27/2011, 9:54 AM

## 2011-05-27 NOTE — Progress Notes (Addendum)
Physical Therapy Session Note  Patient Details  Name: [name] MRN: [id number] Date of Birth: 15-Aug-1960  Today's Date: 05/27/2011 Time: 1310-1350 Time Calculation (min): 40 min  Short Term Goals: Week 1:  PT Short Term Goal 1 (Week 1): pt will transfer with sliding board and supervision on surfaces without armrests PT Short Term Goal 2 (Week 1): pt will be able to perform car transfers with mod A  Skilled Therapeutic Interventions/Progress Updates:    Car transfer training practice- pt mod I with sit to stands using UE's and maintains weight bearing status but required A for LLE d/t ongoing weakness and ROM limitations; Nustep with UE's for cardivascular exercise Encouraged pt to practice bed mobility without rail in room as he states he is not going home with hospital bed Pt reports he had been propelling the w/c outdoors and was I with this activity. Therapy Documentation Precautions:  Precautions Precautions: Fall;Posterior Hip Precaution Comments: L posterior hip Required Braces or Orthoses: Other Brace/Splint Other Brace/Splint: PRAFO in bed L, wears brace for comfort on L knee Restrictions Weight Bearing Restrictions: Yes LUE Weight Bearing: Weight bearing as tolerated RLE Weight Bearing:  (WBAT for transfers only) LLE Weight Bearing: Touchdown weight bearing      Pain: Pain Assessment Pain Assessment: No/denies pain Mobility: Transfers Sit to Stand: 6: Modified independent (Device/Increase time);With armrests;With upper extremity assist Stand to Sit: 6: Modified independent (Device/Increase time);With armrests Locomotion :mod I with w/c but needs assistance for leg rests and getting LLE on/off leg rest       Balance:mod I static standing with UE support   Exercises: Cardiovascular Exercises NuStep: with UE's on level 6 METS >3, HR 105 10 min Other Treatments: Treatments Therapeutic Activity: car transfer stand pivot with RW and use of leg lifter but still  min A to lift LLE in and out of car  See FIM for current functional status  Therapy/Group: Individual Therapy  Michaelene Song 05/27/2011, 2:34 PM

## 2011-05-27 NOTE — Progress Notes (Signed)
Physical Therapy Note  Patient Details  Name: [name] MRN: [id number] Date of Birth: May 10, 1960 Today's Date: 05/27/2011  Time: 1515-1545 30 minutes  No c/o pain. Pt/family ed with pt's wife.  Pt's wife was educated on and safely demonstrated bed mobility, w/c set up, w/c mobility, SPT with RW, positioning in bed and in w/c.  Pt/wife express understanding of home safety and set up recommendations, PT f/u recommendations and safety and mobility for home.  Pt and wife agreeable and excited for d/c.  Individual therapy   Orel Cooler 05/27/2011, 3:50 PM

## 2011-05-27 NOTE — Progress Notes (Signed)
Updated Dr. Laverle Patter regarding patient's discharge date.  Patient non-ambulatory due to limitations to transfer only and still with significant scrotal edema.  He recommends leaving foley in for now. They will set pt up for follow up and foley change in 2 weeks at the office.
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
## 2011-05-27 NOTE — Progress Notes (Signed)
Patient noted with increase drainage from left hip incision . 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 Unable to visualize mid incision staples . Incision pale in middle and red on ends of incision.  Bradley Mile, PA notified . Reviewed foley care with patient and patient needs reinforcement . Continue with plan of care.          [name]

## 2011-05-27 NOTE — Progress Notes (Signed)
Occupational Therapy Session Note  Patient Details  Name: [name] MRN: [id number] Date of Birth: 22-Mar-1960  Today's Date: 05/27/2011 Time: 0730-0825 Time Calculation (min): 55 min  Short Term Goals: Week 1:  OT Short Term Goal 1 (Week 1): Pt will transfer to toilet with min A OT Short Term Goal 2 (Week 1): Pt will bathe 10/10 with AE with min A OT Short Term Goal 3 (Week 1): Pt will perform sit to stand for pericare with mod A OT Short Term Goal 4 (Week 1): Pt will perform bed mobility in prep for ADL at sink level with min a  Skilled Therapeutic Interventions/Progress Updates:    1:1 self care retraining: continues to make good progress with bed mobility with leg lifter, transfers, sit to stand, standing balance with one UE support during functional tasks while maintaining weight bearing precautions.  Therapy Documentation Precautions:  Precautions Precautions: Posterior Hip;Fall Precaution Comments: L posterior hip Required Braces or Orthoses: Other Brace/Splint Other Brace/Splint: L hinged AFO  Restrictions Weight Bearing Restrictions: Yes LUE Weight Bearing: Weight bearing as tolerated LLE Weight Bearing: Non weight bearing Pain: Pain Assessment Pain Score:   3 requested meds at end of session  See FIM for current functional status  Therapy/Group: Individual Therapy  Roney Mans [facility] 05/27/2011, 9:54 AM

## 2011-05-28 ENCOUNTER — Encounter (HOSPITAL_COMMUNITY): Payer: Self-pay | Admitting: General Practice

## 2011-05-28 DIAGNOSIS — Z5189 Encounter for other specified aftercare: Secondary | ICD-10-CM

## 2011-05-28 DIAGNOSIS — S82109A Unspecified fracture of upper end of unspecified tibia, initial encounter for closed fracture: Secondary | ICD-10-CM

## 2011-05-28 LAB — URINE CULTURE: Culture  Setup Time: 201304290946

## 2011-05-28 MED ORDER — AMOXICILLIN-POT CLAVULANATE 875-125 MG PO TABS
1.0000 | ORAL_TABLET | Freq: Two times a day (BID) | ORAL | Status: DC
  Administered 2011-05-28 – 2011-05-29 (×3): 1 via ORAL
  Filled 2011-05-28 (×5): qty 1

## 2011-05-28 MED ORDER — WARFARIN SODIUM 6 MG PO TABS
6.0000 mg | ORAL_TABLET | Freq: Once | ORAL | Status: AC
  Administered 2011-05-28: 6 mg via ORAL
  Filled 2011-05-28 (×2): qty 1

## 2011-05-28 NOTE — Progress Notes (Signed)
Physical Therapy Note  Patient Details  Name: [name] MRN: [id number] Date of Birth: 03/20/1960 Today's Date: 05/28/2011  15:00-16:00 individual therapy 6/10 pain in pelvis called for medication after session  Performed stand pivot transfer with rw supervision with assist for leg rest, performed seated tricep push ups with blocks, shoulder squeezes, shoulder flexion x 30 each wc mobility x 200' with resistance of therapist weight. Hot pack applied to neck and shoulders to resolve tension.  Julian Reil 05/28/2011, 4:21 PM
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
## 2011-05-28 NOTE — Progress Notes (Signed)
Social Work  Patient ID: [name], male   DOB: [birth year], 51 y.o.   MRN: [id number]  Met with patient to review d/c referrals - have ordered DME and hope for delivery this afternoon.  Pt denies any concerns about d/c.  Will continue to follow.  Kevon Tench

## 2011-05-28 NOTE — Progress Notes (Signed)
Physical Therapy Discharge Summary  Patient Details  Name: [name] MRN: [id number] Date of Birth: 1960-12-29  Today's Date: 05/28/2011    Patient has met 7 of 7 long term goals due to improved activity tolerance, improved balance, increased strength, decreased pain and ability to compensate for deficits.  Patient to discharge at a wheelchair level Supervision.   Patient's care partner is independent to provide the necessary physical assistance at discharge.  Reasons goals not met: n/a  Recommendation:  Patient will benefit from Home exercise program only at this time.  Will require PT follow up when weight bearing status is increased.  Equipment: w/c, RW  Reasons for discharge: treatment goals met and discharge from hospital  Patient/family agrees with progress made and goals achieved: Yes  PT Discharge Precautions/Restrictions Precautions Precautions: Posterior Hip Other Brace/Splint: PRAFO L LE Restrictions RLE Weight Bearing: Weight bearing as tolerated LLE Weight Bearing: Touchdown weight bearing Other Position/Activity Restrictions: transfers only   Sensation Sensation Light Touch: Impaired by gross assessment Additional Comments: numbness/tingling L LE Coordination Gross Motor Movements are Fluid and Coordinated: Yes Fine Motor Movements are Fluid and Coordinated: Yes  Mobility Bed Mobility Supine to Sit: 5: Supervision Transfers Stand Pivot Transfers: 5: Supervision  Trunk/Postural Assessment  Cervical Assessment Cervical Assessment: Within Functional Limits Thoracic Assessment Thoracic Assessment: Within Functional Limits Lumbar Assessment Lumbar Assessment: Within Functional Limits Postural Control Postural Control: Within Functional Limits  Balance Static Sitting Balance Static Sitting - Level of Assistance: 7: Independent Static Standing Balance Static Standing - Balance Support: Bilateral upper extremity supported Static Standing - Level  of Assistance: 5: Stand by assistance Extremity Assessment      RLE Assessment RLE Assessment: Within Functional Limits LLE PROM (degrees) LLE Overall PROM Comments: hip flex/ext 2/5, knee flex 2/5, knee ext 3-/5, ankle DF 0/5, ankle PF 2/5, some toe movement  See FIM for current functional status  Farrin Shadle 05/28/2011, 8:22 AM

## 2011-05-28 NOTE — Progress Notes (Signed)
Physical Therapy Note  Patient Details  Name: [name] MRN: [id number] Date of Birth: 13-Jul-1960 Today's Date: 05/28/2011  Time: 1400-1430 30 minutes  No c/o pain.  Stand pivot transfers with setup only for w/c management.  Reviewed HEP of supine exercises including hip abd/add, SAQ, and ankle pumps; provided pt with handout for home.  Discussed plan for continuing PT when pt has increased WB status, pt denied further questions or concerns.  He is safe for d/c at a supervision/setup level to home with family to assist.  Individual therapy  Lockie Pares 05/28/2011, 3:39 PM
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
## 2011-05-28 NOTE — Progress Notes (Signed)
Occupational Therapy Discharge Summary  Patient Details  Name: [name] MRN: [id number] Date of Birth: Mar 14, 1960  Today's Date: 05/28/2011 Time: 10-11:00   Time Calculation (min): 60 min  1:1 GRAD DAY: self care retraining at sink. Continued with d/c planning, home setup, review of DME and AE.  Education completed with wife yesterday afternoon, outside of scheduled therapy. Educated on using AE for LB bathing and dress, importance of maintaining MD orders for transfers only- not hopping/ trying to ambulate into bathroom, stand pivot transfer to drop arm BSC, standing balance, importance of TED hose for LB circulation and elevation in sitting, donning PRAFO and AFO for foot support, sit to stands with RW for UE support, importance of keeping area around catheter clean/hygienic,   Patient has met 7 of 7 long term goals due to improved activity tolerance, improved balance, postural control, ability to compensate for deficits and functional use of  LEFT lower extremity.  Patient to discharge at overall supervision to min A level.  Patient's care partner is independent to provide the necessary physical assistance at discharge.    Reasons goals not met: n/a  Recommendation:  None at this  Equipment: drop arm commode, w/c with cushion, RW (AE/leg lifter)  Reasons for discharge: treatment goals met and discharge from hospital  Patient/family agrees with progress made and goals achieved: Yes  OT Discharge Precautions/Restrictions  Precautions Precautions: Posterior Hip Required Braces or Orthoses: Other Brace/Splint Restrictions RLE Weight Bearing: Weight bearing as tolerated LLE Weight Bearing: Touchdown weight bearing Other Position/Activity Restrictions: transfers only Pain Pain Assessment Pain Assessment: No/denies pain Pain Score:   5 Pain Type: Acute pain Pain Location: Other (Comment) Pain Orientation:  (Left hip/leg) ADL ADL Eating: Independent Grooming:  Independent Upper Body Bathing: Setup Where Assessed-Upper Body Bathing: Chair Lower Body Bathing: Minimal assistance Upper Body Dressing: Modified independent (Device) Where Assessed-Upper Body Dressing: Edge of bed Lower Body Dressing: Minimal assistance Where Assessed-Lower Body Dressing: Edge of bed Toileting: Dependent Vision/Perception  Vision - History Baseline Vision: No visual deficits Patient Visual Report: No change from baseline Vision - Assessment Eye Alignment: Within Functional Limits Perception Perception: Within Functional Limits Praxis Praxis: Intact  Cognition Overall Cognitive Status: Appears within functional limits for tasks assessed Arousal/Alertness: Awake/alert Orientation Level: Oriented X4 Safety/Judgment: Appears intact Sensation Sensation Light Touch: Appears Intact Stereognosis: Appears Intact Hot/Cold: Appears Intact Proprioception: Appears Intact Coordination Gross Motor Movements are Fluid and Coordinated: Yes Fine Motor Movements are Fluid and Coordinated: Yes Motor  Motor Motor: Within Functional Limits Mobility  Bed Mobility Supine to Sit: 5: Supervision Sitting - Scoot to Edge of Bed: 5: Supervision Sit to Supine: 5: Supervision Transfers Sit to Stand: 6: Modified independent (Device/Increase time) Stand to Sit: 6: Modified independent (Device/Increase time)  Trunk/Postural Assessment  Cervical Assessment Cervical Assessment: Within Functional Limits Thoracic Assessment Thoracic Assessment: Within Functional Limits Lumbar Assessment Lumbar Assessment: Within Functional Limits Postural Control Postural Control: Within Functional Limits  Balance Balance Balance Assessed: Yes Static Sitting Balance Static Sitting - Level of Assistance: 7: Independent Static Standing Balance Static Standing - Balance Support: During functional activity Static Standing - Level of Assistance: 5: Stand by assistance Extremity/Trunk  Assessment RUE Assessment RUE Assessment: Within Functional Limits LUE Assessment LUE Assessment: Within Functional Limits  See FIM for current functional status  Roney Mans [facility] 05/28/2011, 12:53 PM

## 2011-05-28 NOTE — Progress Notes (Signed)
Physical Therapy Note  Patient Details  Name: [name] MRN: [id number] Date of Birth: 1961/01/12 Today's Date: 05/28/2011  Time: 4098-1191 45 minutes  Pt c/o pain in pelvis; RN provided medication.  Bed mobility with supervision using leg lifter.  Stand-pivot transfers with supervision and setup for w/c.  Reviewed ther ex and given handout of seated heel slides, supine SAQ, hip IR, heel slides with leg lifter.  Pt is overall at supervision/set up level for transfers.  Individual therapy  Lockie Pares 05/28/2011, 12:04 PM

## 2011-05-29 LAB — PROTIME-INR: INR: 2.02 — ABNORMAL HIGH (ref 0.00–1.49)

## 2011-05-29 MED ORDER — DILTIAZEM HCL ER COATED BEADS 120 MG PO CP24: 120.0000 mg | ORAL_CAPSULE | Freq: Every day | ORAL | Status: DC

## 2011-05-29 MED ORDER — WARFARIN SODIUM 5 MG PO TABS: 5.0000 mg | ORAL_TABLET | Freq: Once | ORAL | Status: DC

## 2011-05-29 MED ORDER — OXYCODONE HCL 15 MG PO TABS: 7.0000 mg | ORAL_TABLET | Freq: Four times a day (QID) | ORAL | Status: AC | PRN

## 2011-05-29 MED ORDER — METHOCARBAMOL 750 MG PO TABS: 750.0000 mg | ORAL_TABLET | Freq: Four times a day (QID) | ORAL | Status: AC

## 2011-05-29 MED ORDER — SENNA 8.6 MG PO TABS: 1.0000 | ORAL_TABLET | Freq: Every day | ORAL | Status: DC

## 2011-05-29 MED ORDER — NYSTATIN 100000 UNIT/GM EX POWD: 1.0000 g | Freq: Two times a day (BID) | CUTANEOUS | Status: DC

## 2011-05-29 MED ORDER — PANTOPRAZOLE SODIUM 40 MG PO TBEC: 40.0000 mg | DELAYED_RELEASE_TABLET | Freq: Every day | ORAL | Status: DC

## 2011-05-29 MED ORDER — WARFARIN SODIUM 6 MG PO TABS
6.0000 mg | ORAL_TABLET | Freq: Once | ORAL | Status: DC
  Filled 2011-05-29: qty 1

## 2011-05-29 MED ORDER — AMOXICILLIN-POT CLAVULANATE 875-125 MG PO TABS: 1.0000 | ORAL_TABLET | Freq: Two times a day (BID) | ORAL | Status: AC

## 2011-05-29 MED ORDER — ACETAMINOPHEN 325 MG PO TABS: 325.0000 mg | ORAL_TABLET | ORAL | Status: DC | PRN

## 2011-05-29 MED ORDER — OXYCODONE HCL 10 MG PO TB12: ORAL_TABLET | ORAL | Status: DC

## 2011-05-29 MED ORDER — PREGABALIN 100 MG PO CAPS: 100.0000 mg | ORAL_CAPSULE | Freq: Three times a day (TID) | ORAL | Status: DC

## 2011-05-29 MED ORDER — ASCORBIC ACID 500 MG PO TABS: 500.0000 mg | ORAL_TABLET | Freq: Every day | ORAL | Status: DC

## 2011-05-29 MED ORDER — FERROUS SULFATE 325 (65 FE) MG PO TABS: 325.0000 mg | ORAL_TABLET | Freq: Three times a day (TID) | ORAL | Status: DC

## 2011-05-29 NOTE — Progress Notes (Signed)
Recreational Therapy Discharge Summary Patient Details  Name: [name] MRN: [id number] Date of Birth: 12/15/1960 Today's Date: 05/29/2011  Long term goals set: 1  Long term goals met: 1  Comments on progress toward goals: Pt has made good progress during ELOS and is ready for discharge home today.  Discussed community pursuits with pt during eval and has also been discussed during PT sessions.  Pt able to identify safety concerns and energy conservation techniques.  Team discussed community reintegration task with pt and pt declined activity.  Pt being discharged home at supervision level (for set-up).  Reasons goals not met: n/a  Equipment acquired: n/a  Reasons for discharge: discharge from hospital  Patient/family agrees with progress made and goals achieved: Yes  [name] 05/29/2011, 8:33 AM
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
## 2011-05-29 NOTE — Discharge Instructions (Signed)
Inpatient Rehab Discharge Instructions  [name] Discharge date and time: 05/29/11   Activities/Precautions/ Functional Status: Activity: activity as tolerated at wheelchair level Diet: regular diet add probiotic yogurt to each meal. Wound Care: routine catheter care and dry dressing to left hip.   Functional status:  ___ No restrictions     ___ Walk up steps independently _X__ 24/7 supervision/assistance   ___ Walk up steps with assistance ___ Intermittent supervision/assistance  ___ Bathe/dress independently ___ Walk with walker     ___ Bathe/dress with assistance ___ Walk Independently    ___ Shower independently ___ Walk with assistance    ___ Shower with assistance _X__ No alcohol     ___ Return to work/school ________   COMMUNITY REFERRALS UPON DISCHARGE:    Home Health:    RN via [facility] @ [phone]                  Medical Equipment/Items Ordered: wheelchair, cushion, walker, drop arm commode                                                     Agency/Supplier: [facility] [phone]   GENERAL COMMUNITY RESOURCES FOR PATIENT/FAMILY: Support Groups:***  N/A:***  Employment Assistance:***   N/A:*** Caregiver Support:***  N/A:*** Education Assistance:***   N/A:*** Mental Health:***   N/A:***  Home Modification:***   N/A:***     Special Instructions:  1. Discontinue laxatives if stool starts getting too soft. 2. Call MD if you start having foley discomfort-stinging, pain, odor, drainge. 3. No walking.  Touch down weight on Left leg for transfers only.  My questions have been answered and I understand these instructions. I will adhere to these goals and the provided educational materials after my discharge from the hospital.  Patient/Caregiver Signature _______________________________ Date __________  Clinician Signature _______________________________________ Date __________  Please bring this form and your medication list with you to all your  follow-up doctor's appointments.    Orthopaedic Trauma Service Discharge Instructions, Pin Site and Wound Care   General Discharge Instructions  WEIGHT BEARING STATUS: Toe touch weight bearing Left leg for transfers only  RANGE OF MOTION/ACTIVITY:Posterior Left hip precautions, unrestricted L knee and ankle ROM  Diet: as you were eating previously.  Can use over the counter stool softeners and bowel preparations, such as Miralax, to help with bowel movements.  Narcotics can be constipating.  Be sure to drink plenty of fluids  STOP SMOKING OR USING NICOTINE PRODUCTS!!!!  As discussed nicotine severely impairs your body's ability to heal surgical and traumatic wounds but also impairs bone healing.  Wounds and bone heal by forming microscopic blood vessels (angiogenesis) and nicotine is a vasoconstrictor (essentially, shrinks blood vessels).  Therefore, if vasoconstriction occurs to these microscopic blood vessels they essentially disappear and are unable to deliver necessary nutrients to the healing tissue.  This is one modifiable factor that you can do to dramatically increase your chances of healing your injury.    (This means no smoking, no nicotine gum, patches, etc)  DO NOT USE NONSTEROIDAL ANTI-INFLAMMATORY DRUGS (NSAID'S)  Using products such as Advil (ibuprofen), Aleve (naproxen), Motrin (ibuprofen) for additional pain control during fracture healing can delay and/or prevent the healing response.  If you would like to take over the counter (OTC) medication, Tylenol (acetaminophen) is ok.  However,  some narcotic medications that are given for pain control contain acetaminophen as well. Therefore, you should not exceed more than 4000 mg of tylenol in a day if you do not have liver disease.  Also note that there are may OTC medicines, such as cold medicines and allergy medicines that my contain tylenol as well.  If you have any questions about medications and/or interactions please ask your  doctor/PA or your pharmacist.   PAIN MEDICATION USE AND EXPECTATIONS  You have likely been given narcotic medications to help control your pain.  After a traumatic event that results in an fracture (broken bone) with or without surgery, it is ok to use narcotic pain medications to help control one's pain.  We understand that everyone responds to pain differently and each individual patient will be evaluated on a regular basis for the continued need for narcotic medications. Ideally, narcotic medication use should last no more than 6-8 weeks (coinciding with fracture healing).   As a patient it is your responsibility as well to monitor narcotic medication use and report the amount and frequency you use these medications when you come to your office visit.   We would also advise that if you are using narcotic medications, you should take a dose prior to therapy to maximize you participation.  IF YOU ARE ON NARCOTIC MEDICATIONS IT IS NOT PERMISSIBLE TO OPERATE A MOTOR VEHICLE (MOTORCYCLE/CAR/TRUCK/MOPED) OR HEAVY MACHINERY DO NOT MIX NARCOTICS WITH OTHER CNS (CENTRAL NERVOUS SYSTEM) DEPRESSANTS SUCH AS ALCOHOL       ICE AND ELEVATE INJURED/OPERATIVE EXTREMITY  Using ice and elevating the injured extremity above your heart can help with swelling and pain control.  Icing in a pulsatile fashion, such as 20 minutes on and 20 minutes off, can be followed.    Do not place ice directly on skin. Make sure there is a barrier between to skin and the ice pack.    Using frozen items such as frozen peas works well as the conform nicely to the are that needs to be iced.  USE AN ACE WRAP OR TED HOSE FOR SWELLING CONTROL  In addition to icing and elevation, Ace wraps or TED hose are used to help limit and resolve swelling.  It is recommended to use Ace wraps or TED hose until you are informed to stop.    When using Ace Wraps start the wrapping distally (farthest away from the body) and wrap proximally (closer to  the body)   Example: If you had surgery on your leg or thing and you do not have a splint on, start the ace wrap at the toes and work your way up to the thigh        If you had surgery on your upper extremity and do not have a splint on, start the ace wrap at your fingers and work your way up to the upper arm  IF YOU ARE IN A SPLINT OR CAST DO NOT REMOVE IT FOR ANY REASON   If your splint gets wet for any reason please contact the office immediately. You may shower in your splint or cast as long as you keep it dry.  This can be done by wrapping in a cast cover or garbage back (or similar)  Do Not stick any thing down your splint or cast such as pencils, money, or hangers to try and scratch yourself with.  If you feel itchy take benadryl as prescribed on the bottle for itching  CALL THE OFFICE WITH  ANY QUESTIONS OR CONCERTS: [phone]     Discharge Pin Site Instructions  Dress pins daily with Kerlix roll starting on POD 2. Wrap the Kerlix so that it tamps the skin down around the pin-skin interface to prevent/limit motion of the skin relative to the pin.  (Pin-skin motion is the primary cause of pain and infection related to external fixator pin sites).  Remove any crust or coagulum that may obstruct drainage with a saline moistened gauze or soap and water.  After POD 3, if there is no discernable drainage on the pin site dressing, the interval for change can by increased to every other day.  You may shower with the fixator, cleaning all pin sites gently with soap and water.  If you have a surgical wound this needs to be completely dry and without drainage before showering.  The extremity can be lifted by the fixator to facilitate wound care and transfers.  Notify the office/Doctor if you experience increasing drainage, redness, or pain from a pin site, or if you notice purulent (thick, snot-like) drainage.  Discharge Wound Care Instructions  Do NOT apply any ointments, solutions or  lotions to pin sites or surgical wounds.  These prevent needed drainage and even though solutions like hydrogen peroxide kill bacteria, they also damage cells lining the pin sites that help fight infection.  Applying lotions or ointments can keep the wounds moist and can cause them to breakdown and open up as well. This can increase the risk for infection. When in doubt call the office.  Surgical incisions should be dressed daily.  If any drainage is noted, use one layer of adaptic, then gauze, Kerlix, and an ace wrap.  Once the incision is completely dry and without drainage, it may be left open to air out.  Showering may begin 36-48 hours later.  Cleaning gently with soap and water.  Traumatic wounds should be dressed daily as well.    One layer of adaptic, gauze, Kerlix, then ace wrap.  The adaptic can be discontinued once the draining has ceased    If you have a wet to dry dressing: wet the gauze with saline the squeeze as much saline out so the gauze is moist (not soaking wet), place moistened gauze over wound, then place a dry gauze over the moist one, followed by Kerlix wrap, then ace wrap.

## 2011-05-29 NOTE — Progress Notes (Signed)
Discharge summary # [phone]

## 2011-05-29 NOTE — Progress Notes (Addendum)
Patient ID: [name], male   DOB: 1960-09-10, 51 y.o.   MRN: [id number] Patient ID: [name], male   DOB: 1960/08/29, 51 y.o.   MRN: [id number] Patient ID: [name], male   DOB: 06-Jun-1960, 51 y.o.   MRN: [id number]  Subjective/Complaints: No new issues. Left hip still with some drainage ROS otherwise negative  Objective: Vital Signs: Blood pressure 117/85, pulse 83, temperature 98.1 F (36.7 C), temperature source Oral, resp. rate 19, height 5\' 9"  (1.753 m), weight 111.9 kg (246 lb 11.1 oz), SpO2 97.00%. No results found. No results found for this basename: WBC:2,HGB:2,HCT:2,PLT:2 in the last 72 hours No results found for this basename: NA:2,K:2,CL:2,CO2:2,GLUCOSE:2,BUN:2,CREATININE:2,CALCIUM:2 in the last 72 hours CBG (last 3)  No results found for this basename: GLUCAP:3 in the last 72 hours  Wt Readings from Last 3 Encounters:  05/28/11 111.9 kg (246 lb 11.1 oz)  05/08/11 108.863 kg (240 lb)  05/08/11 108.863 kg (240 lb)    Physical Exam:  General appearance: alert, cooperative, appears stated age and no distress Head: Normocephalic, without obvious abnormality, atraumatic Eyes: conjunctivae/corneas clear. PERRL, EOM's intact. Fundi benign. Ears: normal TM's and external ear canals both ears Nose: Nares normal. Septum midline. Mucosa normal. No drainage or sinus tenderness. Throat: lips, mucosa, and tongue normal; teeth and gums normal Neck: no adenopathy, no carotid bruit, no JVD, supple, symmetrical, trachea midline and thyroid not enlarged, symmetric, no tenderness/mass/nodules Back: symmetric, no curvature. ROM normal. No CVA tenderness. Resp: clear to auscultation bilaterally Cardio: regular rate and rhythm, S1, S2 normal, no murmur, click, rub or gallop GI: soft, non-tender; bowel sounds normal; no masses,  no organomegaly Extremities: Edema 1 to 2+ LLE and scrotum less swollen Pulses: 2+ and symmetric Skin: Skin color, texture, turgor normal. No rashes  or lesions Neurologic: Grossly normal except LLE-ankle with absent ADF and trace APF. Sensation diminished but still grossly present in the foot and left lateral and posterior leg.. Cognitively appropriate Incision/Wound: wounds clean and dry except for posterior hip which has serosang dressing. Erythema decreased around hip  Assessment/Plan: 1. Functional deficits secondary to multiple pelvic fx's, left tib fib fx, sciatic nerve injury  which require 3+ hours per day of interdisciplinary therapy in a comprehensive inpatient rehab setting. Physiatrist is providing close team supervision and 24 hour management of active medical problems listed below. Physiatrist and rehab team continue to assess barriers to discharge/monitor patient progress toward functional and medical goals.  i'll see the patient in follow up in about a month  -he also will see ortho and uro FIM: FIM - Bathing Bathing Steps Patient Completed: Chest;Right Arm;Right upper leg;Left upper leg;Left Arm;Abdomen;Right lower leg (including foot);Front perineal area;Buttocks Bathing: 4: Min-Patient completes 8-9 78f 10 parts or 75+ percent  FIM - Upper Body Dressing/Undressing Upper body dressing/undressing steps patient completed: Thread/unthread right sleeve of pullover shirt/dresss;Thread/unthread left sleeve of pullover shirt/dress;Put head through opening of pull over shirt/dress;Pull shirt over trunk Upper body dressing/undressing: 7: Complete Independence: No helper FIM - Lower Body Dressing/Undressing Lower body dressing/undressing steps patient completed: Thread/unthread right pants leg;Thread/unthread left pants leg;Pull pants up/down;Don/Doff right shoe Lower body dressing/undressing: 5: Set-up assist to: Don/Doff AFO/prosthesis/orthosis  FIM - Toileting Toileting steps completed by patient: Adjust clothing prior to toileting;Adjust clothing after toileting;Performs perineal hygiene Toileting Assistive Devices: Grab bar  or rail for support Toileting: 5: Supervision: Safety issues/verbal cues  FIM - Archivist Transfers: 5-To toilet/BSC: Supervision (verbal cues/safety issues);5-From toilet/BSC: Supervision (verbal cues/safety issues)  FIM -  Bed/Chair Transport planner Devices:  (leg lifter) Bed/Chair Transfer: 5: Supine > Sit: Supervision (verbal cues/safety issues);5: Bed > Chair or W/C: Supervision (verbal cues/safety issues);5: Chair or W/C > Bed: Supervision (verbal cues/safety issues)  FIM - Locomotion: Wheelchair Distance: 130' over level tile surfaces in controlled environment, min A for management of w/c parts and navigating around obstacles Locomotion: Wheelchair: 6: Travels 150 ft or more, turns around, maneuvers to table, bed or toilet, negotiates 3% grade: maneuvers on rugs and over door sills independently FIM - Locomotion: Ambulation Locomotion: Ambulation: 0: Activity did not occur  Comprehension Comprehension Mode: Auditory Comprehension: 7-Follows complex conversation/direction: With no assist  Expression Expression Mode: Verbal Expression: 7-Expresses complex ideas: With no assist  Social Interaction Social Interaction: 7-Interacts appropriately with others - No medications needed.  Problem Solving Problem Solving: 7-Solves complex problems: Recognizes & self-corrects  Memory Memory: 7-Complete Independence: No helper 1. DVT Prophylaxis/Anticoagulation: Pharmaceutical: Lovenox till coumadin therapeutic. To continue coumadin for 8 weeks total per Dr. Carola Frost.   -dopplers negative 2. Pain Management: Will need to adjust medications to help with tolerance of therapies. Oxy ir and cr presently with reasonable control per patient. Consider an anticonvulsant for neuropathic pain if it becomes more problematic- he tells me pain is reasonable 3. Mood: seems to be fairly upbeat 4. ABLA: Continue iron supplement. 5. A Fib: Now in NSR on cardizem. Monitor HR  on bid basis.  6. Scrotal edema/perineal numbness: Continue foley for now. Scrotal elevation and ice. Swelling continues to slowly decrease  -continue foley- uro outpt f/u 7. Leukocytosis: cellulitis. Day 2 kelfex, ua negative, staph in urine not sens to keflex but clinically he's shown improvement- would stay with the keflex alone for now unless he spikes a temp at home.  -f/u of wound per dr. Carola Frost  -hh wound care follow up 8. Left foot drop this may be sciatic nerve injury versus peroneal nerve injury.i see no evidence of a cauda equina injury.  rom and positioning in bed/chair, AFO LOS (Days) 9 A FACE TO FACE EVALUATION WAS PERFORMED  Draysen Weygandt T 05/29/2011, 8:00 AM

## 2011-05-29 NOTE — Progress Notes (Signed)
Social Work Discharge Note  The overall goal for the admission was met for:   Discharge location: Yes - home with wife  Length of Stay: Yes - 9 days  Discharge activity level: Yes  Home/community participation: Yes  Services provided included: MD, RD, PT, OT, RN, CM, TR, Pharmacy and SW  Financial Services: Private Insurance: [employer]  Follow-up services arranged: Home Health: RN via [facility], DME: 20x18 Ltwt w/c with ELR, cushion, rw, droparm commode via [facility] and Patient/Family has no preference for HH/DME agencies  Comments (or additional information):  Patient/Family verbalized understanding of follow-up arrangements: Yes  Individual responsible for coordination of the follow-up plan: patient  Confirmed correct DME delivered: Atreyu Mak 05/29/2011    Dannia Snook

## 2011-05-29 NOTE — Plan of Care (Signed)
Problem: RH BOWEL ELIMINATION Goal: RH STG MANAGE BOWEL W/MEDICATION W/ASSISTANCE STG Manage Bowel with Medication with modified independence  Outcome: Not Progressing Patient refuse additional medication for promotion of BM

## 2011-05-29 NOTE — Plan of Care (Signed)
Problem: RH BOWEL ELIMINATION Goal: RH STG MANAGE BOWEL W/MEDICATION W/ASSISTANCE STG Manage Bowel with Medication with modified independence  Outcome: Not Progressing Last bowel movement 05-24-11

## 2011-05-29 NOTE — Care Management Note (Signed)
Patient ID: [name], male   DOB: 08-04-1960, 51 y.o.   MRN: [id number] UPdates called to Dorann Lodge at [facility] 4/30, 5/1 & 05/29/11.  Pt ready for d/c today 05/29/11.
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
## 2011-06-12 ENCOUNTER — Telehealth: Payer: Self-pay | Admitting: *Deleted

## 2011-06-12 MED ORDER — OXYCODONE HCL 15 MG PO TABS: 15.0000 mg | ORAL_TABLET | Freq: Four times a day (QID) | ORAL | Status: AC | PRN

## 2011-06-12 NOTE — Telephone Encounter (Signed)
Spoke to pt and get clarification on which medication they want refilled. She is wanting a refill on Oxycodone 15mg . Per Dr. Wynn Banker this will be fine to refill.

## 2011-06-12 NOTE — Telephone Encounter (Signed)
Released from hospital and needs a refill on his short acting pain medication. Please call.
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
## 2011-06-12 NOTE — Telephone Encounter (Signed)
Pt wife aware that rx is ready to be picked up. 

## 2011-06-17 NOTE — Consult Note (Signed)
I have seen and examined the patient. I agree with the findings above.  Budd Palmer, MD 06/17/2011 5:14 PM

## 2011-09-18 ENCOUNTER — Encounter (INDEPENDENT_AMBULATORY_CARE_PROVIDER_SITE_OTHER): Payer: Self-pay

## 2011-10-10 ENCOUNTER — Emergency Department (HOSPITAL_COMMUNITY): Payer: Managed Care, Other (non HMO)

## 2011-10-10 ENCOUNTER — Emergency Department (HOSPITAL_COMMUNITY)
Admission: EM | Admit: 2011-10-10 | Discharge: 2011-10-11 | Disposition: A | Discharge status: Home / Self Care | Payer: Managed Care, Other (non HMO) | Attending: Emergency Medicine | Admitting: Emergency Medicine

## 2011-10-10 ENCOUNTER — Encounter (HOSPITAL_COMMUNITY): Payer: Self-pay | Admitting: Emergency Medicine

## 2011-10-10 DIAGNOSIS — F1010 Alcohol abuse, uncomplicated: Secondary | ICD-10-CM | POA: Insufficient documentation

## 2011-10-10 DIAGNOSIS — S82209A Unspecified fracture of shaft of unspecified tibia, initial encounter for closed fracture: Secondary | ICD-10-CM | POA: Insufficient documentation

## 2011-10-10 DIAGNOSIS — F17200 Nicotine dependence, unspecified, uncomplicated: Secondary | ICD-10-CM | POA: Insufficient documentation

## 2011-10-10 DIAGNOSIS — W19XXXA Unspecified fall, initial encounter: Secondary | ICD-10-CM

## 2011-10-10 DIAGNOSIS — Z8249 Family history of ischemic heart disease and other diseases of the circulatory system: Secondary | ICD-10-CM | POA: Insufficient documentation

## 2011-10-10 DIAGNOSIS — W108XXA Fall (on) (from) other stairs and steps, initial encounter: Secondary | ICD-10-CM | POA: Insufficient documentation

## 2011-10-10 HISTORY — DX: Unspecified atrial fibrillation: I48.91

## 2011-10-10 MED ORDER — ENOXAPARIN SODIUM 120 MG/0.8ML ~~LOC~~ SOLN
110.0000 mg | Freq: Once | SUBCUTANEOUS | Status: AC
  Administered 2011-10-10: 110 mg via SUBCUTANEOUS
  Filled 2011-10-10: qty 0.8

## 2011-10-10 MED ORDER — HYDROMORPHONE HCL PF 1 MG/ML IJ SOLN
1.0000 mg | Freq: Once | INTRAMUSCULAR | Status: AC
  Administered 2011-10-10: 1 mg via INTRAMUSCULAR
  Filled 2011-10-10: qty 1
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
## 2011-10-11 MED ORDER — OXYCODONE-ACETAMINOPHEN 10-325 MG PO TABS: 1.0000 | ORAL_TABLET | ORAL | Status: DC | PRN

## 2011-10-12 NOTE — ED Provider Notes (Signed)
Medical screening examination/treatment/procedure(s) were performed by non-physician practitioner and as supervising physician I was immediately available for consultation/collaboration.  Rasool Rommel, MD 10/12/11 0041 

## 2011-10-15 ENCOUNTER — Encounter (HOSPITAL_COMMUNITY): Payer: Self-pay | Admitting: Pharmacy Technician

## 2011-10-17 ENCOUNTER — Encounter (HOSPITAL_COMMUNITY)
Admission: RE | Admit: 2011-10-17 | Discharge: 2011-10-17 | Disposition: A | Discharge status: Home / Self Care | Payer: Managed Care, Other (non HMO) | Source: Ambulatory Visit | Attending: Orthopedic Surgery | Admitting: Orthopedic Surgery

## 2011-10-17 ENCOUNTER — Encounter (HOSPITAL_COMMUNITY): Payer: Self-pay

## 2011-10-17 LAB — URINALYSIS, ROUTINE W REFLEX MICROSCOPIC
Bilirubin Urine: NEGATIVE
Ketones, ur: NEGATIVE mg/dL
Nitrite: NEGATIVE
Specific Gravity, Urine: 1.022 (ref 1.005–1.030)
Urobilinogen, UA: 0.2 mg/dL (ref 0.0–1.0)

## 2011-10-17 LAB — CBC WITH DIFFERENTIAL/PLATELET
Basophils Absolute: 0 10*3/uL (ref 0.0–0.1)
Basophils Relative: 1 % (ref 0–1)
Eosinophils Absolute: 0.2 10*3/uL (ref 0.0–0.7)
HCT: 43.9 % (ref 39.0–52.0)
Lymphocytes Relative: 30 % (ref 12–46)
Lymphs Abs: 2.4 10*3/uL (ref 0.7–4.0)
MCV: 91.6 fL (ref 78.0–100.0)
Monocytes Relative: 8 % (ref 3–12)
Neutro Abs: 4.8 10*3/uL (ref 1.7–7.7)
RBC: 4.79 MIL/uL (ref 4.22–5.81)
WBC: 8 10*3/uL (ref 4.0–10.5)

## 2011-10-17 LAB — COMPREHENSIVE METABOLIC PANEL
Albumin: 4.2 g/dL (ref 3.5–5.2)
Alkaline Phosphatase: 97 U/L (ref 39–117)
BUN: 14 mg/dL (ref 6–23)
Potassium: 4.4 mEq/L (ref 3.5–5.1)
Total Protein: 7.7 g/dL (ref 6.0–8.3)

## 2011-10-17 LAB — SURGICAL PCR SCREEN
MRSA, PCR: NEGATIVE
Staphylococcus aureus: NEGATIVE

## 2011-10-17 LAB — TYPE AND SCREEN: Antibody Screen: NEGATIVE

## 2011-10-17 LAB — PROTIME-INR
INR: 1.08 (ref 0.00–1.49)
Prothrombin Time: 13.9 seconds (ref 11.6–15.2)

## 2011-10-17 LAB — APTT: aPTT: 34 seconds (ref 24–37)

## 2011-10-17 NOTE — Pre-Procedure Instructions (Signed)
20 [name]  10/17/2011   Your procedure is scheduled on:  10/21/11  Report to [facility] at 530 AM.  Call this number if you have problems the morning of surgery: [phone]   Remember:   Do not eat food:After Midnight.    Take these medicines the morning of surgery with A SIP OF WATER: robaxin,oxycodone,lyrica   Do not wear jewelry, make-up or nail polish.  Do not wear lotions, powders, or perfumes. You may wear deodorant.  Do not shave 48 hours prior to surgery. Men may shave face and neck.  Do not bring valuables to the hospital.  Contacts, dentures or bridgework may not be worn into surgery.  Leave suitcase in the car. After surgery it may be brought to your room.  For patients admitted to the hospital, checkout time is 11:00 AM the day of discharge.   Patients discharged the day of surgery will not be allowed to drive home.  Name and phone number of your driver: family  Special Instructions: Shower using CHG 2 nights before surgery and the night before surgery.  If you shower the day of surgery use CHG.  Use special wash - you have one bottle of CHG for all showers.  You should use approximately 1/3 of the bottle for each shower.   Please read over the following fact sheets that you were given: Pain Booklet, Coughing and Deep Breathing, Blood Transfusion Information, MRSA Information and Surgical Site Infection Prevention

## 2011-10-17 NOTE — Progress Notes (Signed)
Dr Doristine Counter  At [facility]. No recent cardiac strdies

## 2011-10-20 ENCOUNTER — Encounter (HOSPITAL_COMMUNITY): Payer: Self-pay | Admitting: Vascular Surgery

## 2011-10-20 MED ORDER — LACTATED RINGERS IV SOLN: INTRAVENOUS | Status: DC

## 2011-10-20 MED ORDER — CEFAZOLIN SODIUM-DEXTROSE 2-3 GM-% IV SOLR
2.0000 g | INTRAVENOUS | Status: AC
  Administered 2011-10-21: 2 g via INTRAVENOUS
  Filled 2011-10-20: qty 50

## 2011-10-20 MED ORDER — CHLORHEXIDINE GLUCONATE 4 % EX LIQD: 60.0000 mL | Freq: Once | CUTANEOUS | Status: DC
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
## 2011-10-21 ENCOUNTER — Encounter (HOSPITAL_COMMUNITY): Payer: Self-pay | Admitting: Vascular Surgery

## 2011-10-21 ENCOUNTER — Ambulatory Visit (HOSPITAL_COMMUNITY): Payer: Managed Care, Other (non HMO) | Admitting: Vascular Surgery

## 2011-10-21 ENCOUNTER — Ambulatory Visit (HOSPITAL_COMMUNITY): Payer: Managed Care, Other (non HMO)

## 2011-10-21 ENCOUNTER — Encounter (HOSPITAL_COMMUNITY)
Admission: RE | Disposition: A | Discharge status: Home / Self Care | Payer: Self-pay | Source: Ambulatory Visit | Attending: Orthopedic Surgery

## 2011-10-21 ENCOUNTER — Ambulatory Visit (HOSPITAL_COMMUNITY)
Admission: RE | Admit: 2011-10-21 | Discharge: 2011-10-21 | Disposition: A | Discharge status: Home / Self Care | Payer: Managed Care, Other (non HMO) | Source: Ambulatory Visit | Attending: Orthopedic Surgery | Admitting: Orthopedic Surgery

## 2011-10-21 DIAGNOSIS — IMO0002 Reserved for concepts with insufficient information to code with codable children: Secondary | ICD-10-CM | POA: Insufficient documentation

## 2011-10-21 DIAGNOSIS — S82899A Other fracture of unspecified lower leg, initial encounter for closed fracture: Secondary | ICD-10-CM | POA: Insufficient documentation

## 2011-10-21 DIAGNOSIS — Z01810 Encounter for preprocedural cardiovascular examination: Secondary | ICD-10-CM | POA: Insufficient documentation

## 2011-10-21 DIAGNOSIS — S82209B Unspecified fracture of shaft of unspecified tibia, initial encounter for open fracture type I or II: Secondary | ICD-10-CM

## 2011-10-21 DIAGNOSIS — Z01812 Encounter for preprocedural laboratory examination: Secondary | ICD-10-CM | POA: Insufficient documentation

## 2011-10-21 DIAGNOSIS — W19XXXA Unspecified fall, initial encounter: Secondary | ICD-10-CM | POA: Insufficient documentation

## 2011-10-21 HISTORY — PX: TIBIA IM NAIL INSERTION: SHX2516

## 2011-10-21 HISTORY — PX: HARDWARE REMOVAL: SHX979

## 2011-10-21 LAB — GRAM STAIN: Gram Stain: NONE SEEN

## 2011-10-21 SURGERY — INSERTION, INTRAMEDULLARY ROD, TIBIA
Anesthesia: General | Site: Leg Lower | Laterality: Left | Wound class: Clean

## 2011-10-21 MED ORDER — MEPERIDINE HCL 25 MG/ML IJ SOLN
INTRAMUSCULAR | Status: AC
  Filled 2011-10-21: qty 1

## 2011-10-21 MED ORDER — FENTANYL CITRATE 0.05 MG/ML IJ SOLN
INTRAMUSCULAR | Status: DC | PRN
  Administered 2011-10-21 (×2): 100 ug via INTRAVENOUS
  Administered 2011-10-21 (×3): 50 ug via INTRAVENOUS
  Administered 2011-10-21: 150 ug via INTRAVENOUS

## 2011-10-21 MED ORDER — HYDROMORPHONE HCL PF 1 MG/ML IJ SOLN
INTRAMUSCULAR | Status: AC
  Filled 2011-10-21: qty 1

## 2011-10-21 MED ORDER — HYDROMORPHONE HCL PF 1 MG/ML IJ SOLN
0.2500 mg | INTRAMUSCULAR | Status: DC | PRN
  Administered 2011-10-21 (×4): 0.5 mg via INTRAVENOUS

## 2011-10-21 MED ORDER — MIDAZOLAM HCL 5 MG/5ML IJ SOLN
INTRAMUSCULAR | Status: DC | PRN
  Administered 2011-10-21: 2 mg via INTRAVENOUS

## 2011-10-21 MED ORDER — POTASSIUM CHLORIDE IN NACL 20-0.9 MEQ/L-% IV SOLN: INTRAVENOUS | Status: DC

## 2011-10-21 MED ORDER — OXYCODONE HCL 5 MG PO TABS
15.0000 mg | ORAL_TABLET | Freq: Three times a day (TID) | ORAL | Status: DC
  Administered 2011-10-21: 15 mg via ORAL

## 2011-10-21 MED ORDER — LACTATED RINGERS IV SOLN
INTRAVENOUS | Status: DC | PRN
  Administered 2011-10-21 (×2): via INTRAVENOUS

## 2011-10-21 MED ORDER — METHOCARBAMOL 500 MG PO TABS
ORAL_TABLET | ORAL | Status: AC
  Filled 2011-10-21: qty 1

## 2011-10-21 MED ORDER — MEPERIDINE HCL 25 MG/ML IJ SOLN
6.2500 mg | INTRAMUSCULAR | Status: DC | PRN
  Administered 2011-10-21: 12.5 mg via INTRAVENOUS

## 2011-10-21 MED ORDER — PROMETHAZINE HCL 25 MG/ML IJ SOLN: 6.2500 mg | INTRAMUSCULAR | Status: DC | PRN

## 2011-10-21 MED ORDER — 0.9 % SODIUM CHLORIDE (POUR BTL) OPTIME
TOPICAL | Status: DC | PRN
  Administered 2011-10-21: 1000 mL

## 2011-10-21 MED ORDER — GLYCOPYRROLATE 0.2 MG/ML IJ SOLN
INTRAMUSCULAR | Status: DC | PRN
  Administered 2011-10-21: 0.4 mg via INTRAVENOUS

## 2011-10-21 MED ORDER — METHOCARBAMOL 500 MG PO TABS
500.0000 mg | ORAL_TABLET | Freq: Three times a day (TID) | ORAL | Status: DC
  Administered 2011-10-21: 500 mg via ORAL

## 2011-10-21 MED ORDER — OXYCODONE-ACETAMINOPHEN 5-325 MG PO TABS: 1.0000 | ORAL_TABLET | Freq: Four times a day (QID) | ORAL | Status: DC | PRN

## 2011-10-21 MED ORDER — PROPOFOL 10 MG/ML IV BOLUS
INTRAVENOUS | Status: DC | PRN
  Administered 2011-10-21: 200 mg via INTRAVENOUS

## 2011-10-21 MED ORDER — NEOSTIGMINE METHYLSULFATE 1 MG/ML IJ SOLN
INTRAMUSCULAR | Status: DC | PRN
  Administered 2011-10-21: 3 mg via INTRAVENOUS

## 2011-10-21 MED ORDER — MIDAZOLAM HCL 2 MG/2ML IJ SOLN: 0.5000 mg | Freq: Once | INTRAMUSCULAR | Status: DC | PRN

## 2011-10-21 MED ORDER — ROCURONIUM BROMIDE 100 MG/10ML IV SOLN
INTRAVENOUS | Status: DC | PRN
  Administered 2011-10-21: 50 mg via INTRAVENOUS

## 2011-10-21 MED ORDER — PROPOFOL 10 MG/ML IV BOLUS: INTRAVENOUS | Status: DC | PRN

## 2011-10-21 MED ORDER — OXYCODONE HCL 5 MG PO TABS
ORAL_TABLET | ORAL | Status: AC
  Filled 2011-10-21: qty 3

## 2011-10-21 MED ORDER — ONDANSETRON HCL 4 MG/2ML IJ SOLN
INTRAMUSCULAR | Status: DC | PRN
  Administered 2011-10-21: 4 mg via INTRAVENOUS

## 2011-10-21 SURGICAL SUPPLY — 78 items
BANDAGE ELASTIC 4 VELCRO ST LF (GAUZE/BANDAGES/DRESSINGS) ×2 IMPLANT
BANDAGE ELASTIC 6 VELCRO ST LF (GAUZE/BANDAGES/DRESSINGS) ×2 IMPLANT
BANDAGE ESMARK 6X9 LF (GAUZE/BANDAGES/DRESSINGS) ×1 IMPLANT
BANDAGE GAUZE ELAST BULKY 4 IN (GAUZE/BANDAGES/DRESSINGS) ×4 IMPLANT
BIT DRILL 3.8X6 NS (BIT) ×1 IMPLANT
BIT DRILL 4.4 NS (BIT) ×1 IMPLANT
BLADE SURG 10 STRL SS (BLADE) ×3 IMPLANT
BNDG CMPR 9X6 STRL LF SNTH (GAUZE/BANDAGES/DRESSINGS) ×1
BNDG COHESIVE 4X5 TAN STRL (GAUZE/BANDAGES/DRESSINGS) ×1 IMPLANT
BNDG COHESIVE 6X5 TAN STRL LF (GAUZE/BANDAGES/DRESSINGS) ×1 IMPLANT
BNDG ESMARK 6X9 LF (GAUZE/BANDAGES/DRESSINGS) ×2
BRUSH SCRUB DISP (MISCELLANEOUS) ×4 IMPLANT
CLEANER TIP ELECTROSURG 2X2 (MISCELLANEOUS) ×1 IMPLANT
CLOTH BEACON ORANGE TIMEOUT ST (SAFETY) ×2 IMPLANT
COVER SURGICAL LIGHT HANDLE (MISCELLANEOUS) ×4 IMPLANT
CUFF TOURNIQUET SINGLE 18IN (TOURNIQUET CUFF) IMPLANT
CUFF TOURNIQUET SINGLE 24IN (TOURNIQUET CUFF) IMPLANT
CUFF TOURNIQUET SINGLE 34IN LL (TOURNIQUET CUFF) IMPLANT
DRAPE C-ARM 42X72 X-RAY (DRAPES) ×3 IMPLANT
DRAPE C-ARMOR (DRAPES) ×3 IMPLANT
DRAPE INCISE IOBAN 66X45 STRL (DRAPES) ×1 IMPLANT
DRAPE OEC MINIVIEW 54X84 (DRAPES) ×2 IMPLANT
DRAPE ORTHO SPLIT 77X108 STRL (DRAPES) ×2
DRAPE SURG ORHT 6 SPLT 77X108 (DRAPES) ×2 IMPLANT
DRAPE U-SHAPE 47X51 STRL (DRAPES) ×2 IMPLANT
DRSG ADAPTIC 3X8 NADH LF (GAUZE/BANDAGES/DRESSINGS) ×2 IMPLANT
ELECT REM PT RETURN 9FT ADLT (ELECTROSURGICAL) ×2
ELECTRODE REM PT RTRN 9FT ADLT (ELECTROSURGICAL) ×1 IMPLANT
EVACUATOR 1/8 PVC DRAIN (DRAIN) IMPLANT
GLOVE BIO SURGEON STRL SZ7.5 (GLOVE) ×2 IMPLANT
GLOVE BIO SURGEON STRL SZ8 (GLOVE) ×2 IMPLANT
GLOVE BIO SURGEON STRL SZ8.5 (GLOVE) ×2 IMPLANT
GLOVE BIOGEL PI IND STRL 7.5 (GLOVE) ×1 IMPLANT
GLOVE BIOGEL PI IND STRL 8 (GLOVE) ×1 IMPLANT
GLOVE BIOGEL PI INDICATOR 7.5 (GLOVE) ×1
GLOVE BIOGEL PI INDICATOR 8 (GLOVE) ×1
GOWN PREVENTION PLUS XLARGE (GOWN DISPOSABLE) ×2 IMPLANT
GOWN STRL NON-REIN LRG LVL3 (GOWN DISPOSABLE) ×4 IMPLANT
GUIDEWIRE BALL NOSE 80CM (WIRE) ×1 IMPLANT
KIT BASIN OR (CUSTOM PROCEDURE TRAY) ×2 IMPLANT
KIT ROOM TURNOVER OR (KITS) ×2 IMPLANT
MANIFOLD NEPTUNE II (INSTRUMENTS) ×2 IMPLANT
NAIL TIBIAL 9MMX34.5CM (Nail) ×1 IMPLANT
NEEDLE 22X1 1/2 (OR ONLY) (NEEDLE) IMPLANT
NS IRRIG 1000ML POUR BTL (IV SOLUTION) ×2 IMPLANT
PACK GENERAL/GYN (CUSTOM PROCEDURE TRAY) ×1 IMPLANT
PACK ORTHO EXTREMITY (CUSTOM PROCEDURE TRAY) ×2 IMPLANT
PAD ARMBOARD 7.5X6 YLW CONV (MISCELLANEOUS) ×4 IMPLANT
PADDING CAST COTTON 6X4 STRL (CAST SUPPLIES) ×6 IMPLANT
SCREW ACECAP 32MM (Screw) ×1 IMPLANT
SCREW ACECAP 38MM (Screw) ×1 IMPLANT
SCREW ACECAP 42MM (Screw) ×1 IMPLANT
SCREW PROXIMAL 4.5MMX18MM (Screw) ×1 IMPLANT
SCREW PROXIMAL DEPUY (Screw) ×2 IMPLANT
SCREW PRXML FT 65X5.5XNS CORT (Screw) IMPLANT
SPONGE GAUZE 4X4 12PLY (GAUZE/BANDAGES/DRESSINGS) ×2 IMPLANT
SPONGE LAP 18X18 X RAY DECT (DISPOSABLE) ×4 IMPLANT
SPONGE SCRUB IODOPHOR (GAUZE/BANDAGES/DRESSINGS) ×2 IMPLANT
STAPLER VISISTAT 35W (STAPLE) ×2 IMPLANT
STOCKINETTE IMPERVIOUS LG (DRAPES) ×2 IMPLANT
STRIP CLOSURE SKIN 1/2X4 (GAUZE/BANDAGES/DRESSINGS) ×1 IMPLANT
SUCTION FRAZIER TIP 10 FR DISP (SUCTIONS) IMPLANT
SUT ETHILON 3 0 PS 1 (SUTURE) ×3 IMPLANT
SUT PDS AB 2-0 CT1 27 (SUTURE) IMPLANT
SUT PROLENE 3 0 PS 2 (SUTURE) IMPLANT
SUT VIC AB 0 CT1 27 (SUTURE) ×4
SUT VIC AB 0 CT1 27XBRD ANBCTR (SUTURE) IMPLANT
SUT VIC AB 2-0 CT1 27 (SUTURE) ×4
SUT VIC AB 2-0 CT1 TAPERPNT 27 (SUTURE) IMPLANT
SUT VIC AB 2-0 CT3 27 (SUTURE) IMPLANT
SYR CONTROL 10ML LL (SYRINGE) IMPLANT
TOWEL OR 17X24 6PK STRL BLUE (TOWEL DISPOSABLE) ×4 IMPLANT
TOWEL OR 17X26 10 PK STRL BLUE (TOWEL DISPOSABLE) ×4 IMPLANT
TRAY FOLEY CATH 14FR (SET/KITS/TRAYS/PACK) ×1 IMPLANT
TUBE CONNECTING 12X1/4 (SUCTIONS) ×2 IMPLANT
UNDERPAD 30X30 INCONTINENT (UNDERPADS AND DIAPERS) ×2 IMPLANT
WATER STERILE IRR 1000ML POUR (IV SOLUTION) ×4 IMPLANT
YANKAUER SUCT BULB TIP NO VENT (SUCTIONS) ×2 IMPLANT

## 2011-10-21 NOTE — Preoperative (Signed)
Beta Blockers   Reason not to administer Beta Blockers:Not Applicable 

## 2011-10-21 NOTE — Transfer of Care (Signed)
Immediate Anesthesia Transfer of Care Note  Patient: [name]  Procedure(s) Performed: Procedure(s) (LRB) with comments: INTRAMEDULLARY (IM) NAIL TIBIAL (Left) HARDWARE REMOVAL (Left) - left tibial   Patient Location: PACU  Anesthesia Type: General  Level of Consciousness: awake, alert , oriented and sedated  Airway & Oxygen Therapy: Patient Spontanous Breathing and Patient connected to nasal cannula oxygen  Post-op Assessment: Report given to PACU RN, Post -op Vital signs reviewed and stable and Patient moving all extremities  Post vital signs: Reviewed and stable  Complications: No apparent anesthesia complications

## 2011-10-21 NOTE — H&P (Signed)
I have seen and examined the patient. I agree with the findings above. I discussed with the patient the risks and benefits of surgery, including the possibility of infection, nerve injury, persistent nonunion, vessel injury, wound breakdown, arthritis, symptomatic hardware, DVT/ PE, loss of motion, and need for further surgery among others.  He understood these risks and wished to proceed.  Budd Palmer, MD 10/21/2011 8:08 AM
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
## 2011-10-21 NOTE — Anesthesia Postprocedure Evaluation (Signed)
  Anesthesia Post-op Note  Patient: [name]  Procedure(s) Performed: Procedure(s) (LRB) with comments: INTRAMEDULLARY (IM) NAIL TIBIAL (Left) HARDWARE REMOVAL (Left) - left tibial   Patient Location: PACU  Anesthesia Type: General  Level of Consciousness: awake, alert  and oriented  Airway and Oxygen Therapy: Patient Spontanous Breathing  Post-op Pain: mild  Post-op Assessment: Post-op Vital signs reviewed, Patient's Cardiovascular Status Stable, Respiratory Function Stable, Patent Airway, No signs of Nausea or vomiting and Pain level controlled  Post-op Vital Signs: Reviewed and stable  Complications: No apparent anesthesia complications

## 2011-10-21 NOTE — Brief Op Note (Signed)
10/21/2011  10:18 AM  PATIENT:  [name]  51 y.o. male  PRE-OPERATIVE DIAGNOSIS:  left tibial fracture non-union  POST-OPERATIVE DIAGNOSIS:  left tibial fracture non-union  PROCEDURE:  Procedure(s) (LRB) with comments: INTRAMEDULLARY (IM) NAIL TIBIAL (Left) HARDWARE REMOVAL (Left) - left tibial   SURGEON:  Surgeon(s) and Role:    * Budd Palmer, MD - Primary  PHYSICIAN ASSISTANT: Montez Morita, [facility]  ANESTHESIA:   general  EBL:  Total I/O In: 1500 [I.V.:1500] Out: 175 [Blood:175]  BLOOD ADMINISTERED:none  DRAINS: none   LOCAL MEDICATIONS USED:  NONE  SPECIMEN:  Source of Specimen:  tibia nonunion site periplate fibrous tissue  DISPOSITION OF SPECIMEN:  micro  COUNTS:  YES  TOURNIQUET:  * No tourniquets in log *  DICTATION: .Other Dictation: Dictation Number [id number]  PLAN OF CARE: Admit for overnight observation  PATIENT DISPOSITION:  PACU - hemodynamically stable.   Delay start of Pharmacological VTE agent (>24hrs) due to surgical blood loss or risk of bleeding: no

## 2011-10-22 ENCOUNTER — Encounter (HOSPITAL_COMMUNITY): Payer: Self-pay | Admitting: Orthopedic Surgery
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
## 2011-10-24 LAB — TISSUE CULTURE
Culture: NO GROWTH
Gram Stain: NONE SEEN

## 2011-10-26 LAB — ANAEROBIC CULTURE

## 2011-11-03 ENCOUNTER — Telehealth: Payer: Self-pay | Admitting: *Deleted

## 2011-11-03 NOTE — Telephone Encounter (Signed)
[employer] supplies [name]'s wheelchair and authorization has expired. We need to know if he still needs wheelchair or do we need to pick it up? I called and spoke with [name] and he is still using it and says he still has about 2 more surgeries to go, and so I notified [employer] of this.

## 2011-11-07 ENCOUNTER — Other Ambulatory Visit: Payer: Self-pay | Admitting: Orthopedic Surgery

## 2011-11-07 DIAGNOSIS — M25552 Pain in left hip: Secondary | ICD-10-CM

## 2011-11-07 DIAGNOSIS — M25562 Pain in left knee: Secondary | ICD-10-CM

## 2011-11-12 ENCOUNTER — Ambulatory Visit
Admission: RE | Admit: 2011-11-12 | Discharge: 2011-11-12 | Disposition: A | Discharge status: Home / Self Care | Payer: Managed Care, Other (non HMO) | Source: Ambulatory Visit | Attending: Orthopedic Surgery | Admitting: Orthopedic Surgery

## 2011-11-12 DIAGNOSIS — M25562 Pain in left knee: Secondary | ICD-10-CM

## 2011-11-12 DIAGNOSIS — M25552 Pain in left hip: Secondary | ICD-10-CM

## 2011-11-26 ENCOUNTER — Other Ambulatory Visit: Payer: Self-pay | Admitting: Orthopedic Surgery

## 2011-12-05 ENCOUNTER — Ambulatory Visit
Admission: RE | Admit: 2011-12-05 | Discharge: 2011-12-05 | Disposition: A | Discharge status: Home / Self Care | Payer: Managed Care, Other (non HMO) | Source: Ambulatory Visit | Attending: Orthopedic Surgery | Admitting: Orthopedic Surgery

## 2011-12-05 ENCOUNTER — Other Ambulatory Visit: Payer: Self-pay | Admitting: Orthopedic Surgery

## 2011-12-05 DIAGNOSIS — R52 Pain, unspecified: Secondary | ICD-10-CM

## 2011-12-09 ENCOUNTER — Other Ambulatory Visit: Payer: Self-pay | Admitting: Orthopedic Surgery

## 2011-12-09 DIAGNOSIS — M25552 Pain in left hip: Secondary | ICD-10-CM

## 2011-12-10 ENCOUNTER — Ambulatory Visit
Admission: RE | Admit: 2011-12-10 | Discharge: 2011-12-10 | Disposition: A | Discharge status: Home / Self Care | Payer: Managed Care, Other (non HMO) | Source: Ambulatory Visit | Attending: Orthopedic Surgery | Admitting: Orthopedic Surgery

## 2011-12-10 DIAGNOSIS — M25552 Pain in left hip: Secondary | ICD-10-CM

## 2011-12-10 LAB — CELL COUNT + DIFF, W/O CRYST-SYNVL FLD

## 2011-12-11 ENCOUNTER — Other Ambulatory Visit: Payer: Self-pay | Admitting: Orthopedic Surgery

## 2011-12-11 DIAGNOSIS — M25559 Pain in unspecified hip: Secondary | ICD-10-CM

## 2011-12-12 ENCOUNTER — Ambulatory Visit
Admission: RE | Admit: 2011-12-12 | Discharge: 2011-12-12 | Disposition: A | Discharge status: Home / Self Care | Payer: 59 | Source: Ambulatory Visit | Attending: Orthopedic Surgery | Admitting: Orthopedic Surgery

## 2011-12-12 DIAGNOSIS — M25559 Pain in unspecified hip: Secondary | ICD-10-CM

## 2011-12-12 LAB — CELL COUNT + DIFF, W/O CRYST-SYNVL FLD
Lymphocytes-Synovial Fld: 7 % (ref 0–20)
Monocyte/Macrophage: 2 % — ABNORMAL LOW (ref 50–90)
Neutrophil, Synovial: 89 % — ABNORMAL HIGH (ref 0–25)
WBC, Synovial: 7760 cu mm — ABNORMAL HIGH (ref 0–200)

## 2011-12-13 LAB — BODY FLUID CULTURE

## 2012-01-28 HISTORY — PX: JOINT REPLACEMENT: SHX530

## 2012-01-28 HISTORY — PX: HIP ARTHROPLASTY: SHX981

## 2012-09-16 DIAGNOSIS — M25559 Pain in unspecified hip: Secondary | ICD-10-CM | POA: Insufficient documentation
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
## 2014-01-02 DIAGNOSIS — M2352 Chronic instability of knee, left knee: Secondary | ICD-10-CM | POA: Diagnosis not present

## 2014-01-02 DIAGNOSIS — M1712 Unilateral primary osteoarthritis, left knee: Secondary | ICD-10-CM | POA: Diagnosis not present

## 2014-01-10 DIAGNOSIS — M2352 Chronic instability of knee, left knee: Secondary | ICD-10-CM | POA: Diagnosis not present

## 2014-01-12 DIAGNOSIS — M2352 Chronic instability of knee, left knee: Secondary | ICD-10-CM | POA: Diagnosis not present

## 2014-01-17 DIAGNOSIS — M2352 Chronic instability of knee, left knee: Secondary | ICD-10-CM | POA: Diagnosis not present

## 2014-01-24 DIAGNOSIS — M2352 Chronic instability of knee, left knee: Secondary | ICD-10-CM | POA: Diagnosis not present

## 2014-01-26 DIAGNOSIS — M2352 Chronic instability of knee, left knee: Secondary | ICD-10-CM | POA: Diagnosis not present

## 2014-02-09 DIAGNOSIS — M2352 Chronic instability of knee, left knee: Secondary | ICD-10-CM | POA: Diagnosis not present

## 2014-02-13 DIAGNOSIS — M1712 Unilateral primary osteoarthritis, left knee: Secondary | ICD-10-CM | POA: Diagnosis not present

## 2014-02-13 DIAGNOSIS — M2352 Chronic instability of knee, left knee: Secondary | ICD-10-CM | POA: Diagnosis not present

## 2014-02-14 DIAGNOSIS — M2352 Chronic instability of knee, left knee: Secondary | ICD-10-CM | POA: Diagnosis not present

## 2014-02-21 DIAGNOSIS — M2352 Chronic instability of knee, left knee: Secondary | ICD-10-CM | POA: Diagnosis not present

## 2014-02-23 DIAGNOSIS — M2352 Chronic instability of knee, left knee: Secondary | ICD-10-CM | POA: Diagnosis not present

## 2014-02-28 DIAGNOSIS — M2352 Chronic instability of knee, left knee: Secondary | ICD-10-CM | POA: Diagnosis not present

## 2014-03-02 ENCOUNTER — Encounter (HOSPITAL_COMMUNITY): Payer: Self-pay | Admitting: Emergency Medicine

## 2014-03-02 ENCOUNTER — Other Ambulatory Visit: Payer: Self-pay | Admitting: Family Medicine

## 2014-03-02 ENCOUNTER — Ambulatory Visit
Admission: RE | Admit: 2014-03-02 | Discharge: 2014-03-02 | Disposition: A | Discharge status: Home / Self Care | Payer: Medicare Other | Source: Ambulatory Visit | Attending: Family Medicine | Admitting: Family Medicine

## 2014-03-02 ENCOUNTER — Inpatient Hospital Stay (HOSPITAL_COMMUNITY)
Admission: EM | Admit: 2014-03-02 | Discharge: 2014-03-04 | DRG: 419 | Disposition: A | Discharge status: Home / Self Care | Payer: PRIVATE HEALTH INSURANCE | Attending: Surgery | Admitting: Surgery

## 2014-03-02 DIAGNOSIS — K8012 Calculus of gallbladder with acute and chronic cholecystitis without obstruction: Secondary | ICD-10-CM | POA: Diagnosis not present

## 2014-03-02 DIAGNOSIS — Z8249 Family history of ischemic heart disease and other diseases of the circulatory system: Secondary | ICD-10-CM | POA: Diagnosis not present

## 2014-03-02 DIAGNOSIS — K810 Acute cholecystitis: Secondary | ICD-10-CM

## 2014-03-02 DIAGNOSIS — I4891 Unspecified atrial fibrillation: Secondary | ICD-10-CM | POA: Diagnosis present

## 2014-03-02 DIAGNOSIS — Z79899 Other long term (current) drug therapy: Secondary | ICD-10-CM

## 2014-03-02 DIAGNOSIS — Z79891 Long term (current) use of opiate analgesic: Secondary | ICD-10-CM | POA: Diagnosis not present

## 2014-03-02 DIAGNOSIS — K8000 Calculus of gallbladder with acute cholecystitis without obstruction: Secondary | ICD-10-CM | POA: Diagnosis present

## 2014-03-02 DIAGNOSIS — K838 Other specified diseases of biliary tract: Secondary | ICD-10-CM | POA: Diagnosis not present

## 2014-03-02 DIAGNOSIS — K8020 Calculus of gallbladder without cholecystitis without obstruction: Secondary | ICD-10-CM

## 2014-03-02 DIAGNOSIS — K819 Cholecystitis, unspecified: Secondary | ICD-10-CM | POA: Diagnosis not present

## 2014-03-02 DIAGNOSIS — F17220 Nicotine dependence, chewing tobacco, uncomplicated: Secondary | ICD-10-CM | POA: Diagnosis present

## 2014-03-02 DIAGNOSIS — R1011 Right upper quadrant pain: Secondary | ICD-10-CM

## 2014-03-02 DIAGNOSIS — K8066 Calculus of gallbladder and bile duct with acute and chronic cholecystitis without obstruction: Secondary | ICD-10-CM | POA: Diagnosis not present

## 2014-03-02 DIAGNOSIS — Z419 Encounter for procedure for purposes other than remedying health state, unspecified: Secondary | ICD-10-CM

## 2014-03-02 DIAGNOSIS — Z9049 Acquired absence of other specified parts of digestive tract: Secondary | ICD-10-CM | POA: Diagnosis not present

## 2014-03-02 LAB — CBC WITH DIFFERENTIAL/PLATELET
BASOS PCT: 0 % (ref 0–1)
Basophils Absolute: 0 10*3/uL (ref 0.0–0.1)
EOS ABS: 0 10*3/uL (ref 0.0–0.7)
Eosinophils Relative: 0 % (ref 0–5)
HEMATOCRIT: 44.2 % (ref 39.0–52.0)
Hemoglobin: 15 g/dL (ref 13.0–17.0)
LYMPHS ABS: 1.4 10*3/uL (ref 0.7–4.0)
LYMPHS PCT: 9 % — AB (ref 12–46)
MCH: 30.9 pg (ref 26.0–34.0)
MCHC: 33.9 g/dL (ref 30.0–36.0)
MCV: 91.1 fL (ref 78.0–100.0)
MONO ABS: 1 10*3/uL (ref 0.1–1.0)
Monocytes Relative: 6 % (ref 3–12)
NEUTROS ABS: 14.2 10*3/uL — AB (ref 1.7–7.7)
NEUTROS PCT: 85 % — AB (ref 43–77)
PLATELETS: 285 10*3/uL (ref 150–400)
RBC: 4.85 MIL/uL (ref 4.22–5.81)
RDW: 12 % (ref 11.5–15.5)
WBC: 16.7 10*3/uL — ABNORMAL HIGH (ref 4.0–10.5)

## 2014-03-02 LAB — COMPREHENSIVE METABOLIC PANEL
ALBUMIN: 4.1 g/dL (ref 3.5–5.2)
ALK PHOS: 92 U/L (ref 39–117)
ALT: 23 U/L (ref 0–53)
AST: 21 U/L (ref 0–37)
Anion gap: 13 (ref 5–15)
BUN: 6 mg/dL (ref 6–23)
CO2: 22 mmol/L (ref 19–32)
Calcium: 9.2 mg/dL (ref 8.4–10.5)
Chloride: 101 mmol/L (ref 96–112)
Creatinine, Ser: 0.68 mg/dL (ref 0.50–1.35)
GFR calc Af Amer: >90 mL/min (ref >90)
GLUCOSE: 107 mg/dL — AB (ref 70–99)
Potassium: 3.8 mmol/L (ref 3.5–5.1)
Sodium: 136 mmol/L (ref 135–145)
TOTAL PROTEIN: 8 g/dL (ref 6.0–8.3)
Total Bilirubin: 0.9 mg/dL (ref 0.3–1.2)

## 2014-03-02 LAB — LIPASE, BLOOD: Lipase: 21 U/L (ref 11–59)

## 2014-03-02 MED ORDER — ONDANSETRON HCL 4 MG/2ML IJ SOLN
4.0000 mg | Freq: Once | INTRAMUSCULAR | Status: AC
  Administered 2014-03-02: 4 mg via INTRAVENOUS
  Filled 2014-03-02: qty 2

## 2014-03-02 MED ORDER — HYDROMORPHONE HCL 1 MG/ML IJ SOLN
1.0000 mg | Freq: Once | INTRAMUSCULAR | Status: AC
  Administered 2014-03-02: 1 mg via INTRAVENOUS
  Filled 2014-03-02: qty 1

## 2014-03-02 MED ORDER — ENOXAPARIN SODIUM 40 MG/0.4ML ~~LOC~~ SOLN
40.0000 mg | SUBCUTANEOUS | Status: DC
  Administered 2014-03-02: 40 mg via SUBCUTANEOUS
  Filled 2014-03-02 (×2): qty 0.4

## 2014-03-02 MED ORDER — ACETAMINOPHEN 325 MG PO TABS: 650.0000 mg | ORAL_TABLET | Freq: Four times a day (QID) | ORAL | Status: DC | PRN

## 2014-03-02 MED ORDER — HYDROMORPHONE HCL 1 MG/ML IJ SOLN
1.0000 mg | INTRAMUSCULAR | Status: DC | PRN
  Administered 2014-03-02 – 2014-03-03 (×3): 1 mg via INTRAVENOUS
  Filled 2014-03-02 (×3): qty 1

## 2014-03-02 MED ORDER — ACETAMINOPHEN 650 MG RE SUPP: 650.0000 mg | Freq: Four times a day (QID) | RECTAL | Status: DC | PRN

## 2014-03-02 MED ORDER — ONDANSETRON HCL 4 MG/2ML IJ SOLN: 4.0000 mg | Freq: Four times a day (QID) | INTRAMUSCULAR | Status: DC | PRN

## 2014-03-02 MED ORDER — CIPROFLOXACIN IN D5W 400 MG/200ML IV SOLN
400.0000 mg | Freq: Two times a day (BID) | INTRAVENOUS | Status: DC
  Administered 2014-03-02 – 2014-03-03 (×2): 400 mg via INTRAVENOUS
  Filled 2014-03-02 (×3): qty 200

## 2014-03-02 MED ORDER — OXYCODONE HCL 5 MG PO TABS: 5.0000 mg | ORAL_TABLET | ORAL | Status: DC | PRN

## 2014-03-02 NOTE — ED Notes (Signed)
Nurse stated she will get blood when start IV
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
## 2014-03-02 NOTE — ED Notes (Signed)
Pt aware of need for UA, urinal at bedside.

## 2014-03-02 NOTE — ED Notes (Signed)
Pt sent from PCP for gallstones after having scan done earlier today. Pt states pain started this morning around 430am. Pt denying n/v/d.

## 2014-03-02 NOTE — ED Notes (Signed)
Attempted to call report, receiving RN unavailable.  
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
## 2014-03-03 ENCOUNTER — Inpatient Hospital Stay (HOSPITAL_COMMUNITY): Payer: PRIVATE HEALTH INSURANCE

## 2014-03-03 ENCOUNTER — Encounter (HOSPITAL_COMMUNITY): Payer: Self-pay | Admitting: Anesthesiology

## 2014-03-03 ENCOUNTER — Inpatient Hospital Stay (HOSPITAL_COMMUNITY): Payer: PRIVATE HEALTH INSURANCE | Admitting: Certified Registered Nurse Anesthetist

## 2014-03-03 ENCOUNTER — Encounter (HOSPITAL_COMMUNITY): Admission: EM | Disposition: A | Discharge status: Home / Self Care | Payer: Self-pay | Source: Home / Self Care

## 2014-03-03 DIAGNOSIS — K8012 Calculus of gallbladder with acute and chronic cholecystitis without obstruction: Secondary | ICD-10-CM | POA: Diagnosis not present

## 2014-03-03 DIAGNOSIS — K8000 Calculus of gallbladder with acute cholecystitis without obstruction: Secondary | ICD-10-CM | POA: Diagnosis present

## 2014-03-03 HISTORY — PX: CHOLECYSTECTOMY: SHX55

## 2014-03-03 LAB — URINALYSIS, ROUTINE W REFLEX MICROSCOPIC
GLUCOSE, UA: NEGATIVE mg/dL
Hgb urine dipstick: NEGATIVE
Ketones, ur: >80 mg/dL — AB
Leukocytes, UA: NEGATIVE
NITRITE: NEGATIVE
PROTEIN: NEGATIVE mg/dL
Specific Gravity, Urine: 1.02 (ref 1.005–1.030)
UROBILINOGEN UA: 0.2 mg/dL (ref 0.0–1.0)
pH: 6 (ref 5.0–8.0)

## 2014-03-03 LAB — CBC
HCT: 41.6 % (ref 39.0–52.0)
HEMOGLOBIN: 13.9 g/dL (ref 13.0–17.0)
MCH: 30.8 pg (ref 26.0–34.0)
MCHC: 33.4 g/dL (ref 30.0–36.0)
MCV: 92.2 fL (ref 78.0–100.0)
PLATELETS: 260 10*3/uL (ref 150–400)
RBC: 4.51 MIL/uL (ref 4.22–5.81)
RDW: 12.1 % (ref 11.5–15.5)
WBC: 13.9 10*3/uL — AB (ref 4.0–10.5)

## 2014-03-03 LAB — COMPREHENSIVE METABOLIC PANEL
ALBUMIN: 3.6 g/dL (ref 3.5–5.2)
ALK PHOS: 83 U/L (ref 39–117)
ALT: 19 U/L (ref 0–53)
AST: 14 U/L (ref 0–37)
Anion gap: 10 (ref 5–15)
BUN: 5 mg/dL — ABNORMAL LOW (ref 6–23)
CHLORIDE: 100 mmol/L (ref 96–112)
CO2: 26 mmol/L (ref 19–32)
Calcium: 8.7 mg/dL (ref 8.4–10.5)
Creatinine, Ser: 0.67 mg/dL (ref 0.50–1.35)
GFR calc non Af Amer: >90 mL/min (ref >90)
GLUCOSE: 130 mg/dL — AB (ref 70–99)
POTASSIUM: 3.7 mmol/L (ref 3.5–5.1)
Sodium: 136 mmol/L (ref 135–145)
Total Bilirubin: 1 mg/dL (ref 0.3–1.2)
Total Protein: 7 g/dL (ref 6.0–8.3)

## 2014-03-03 LAB — SURGICAL PCR SCREEN
MRSA, PCR: NEGATIVE
Staphylococcus aureus: NEGATIVE

## 2014-03-03 SURGERY — LAPAROSCOPIC CHOLECYSTECTOMY WITH INTRAOPERATIVE CHOLANGIOGRAM
Anesthesia: General | Site: Abdomen

## 2014-03-03 MED ORDER — DEXAMETHASONE SODIUM PHOSPHATE 10 MG/ML IJ SOLN
INTRAMUSCULAR | Status: AC
  Filled 2014-03-03: qty 1

## 2014-03-03 MED ORDER — ONDANSETRON HCL 4 MG PO TABS: 4.0000 mg | ORAL_TABLET | Freq: Four times a day (QID) | ORAL | Status: DC | PRN

## 2014-03-03 MED ORDER — ONDANSETRON HCL 4 MG/2ML IJ SOLN: 4.0000 mg | Freq: Four times a day (QID) | INTRAMUSCULAR | Status: DC | PRN

## 2014-03-03 MED ORDER — FENTANYL CITRATE 0.05 MG/ML IJ SOLN
INTRAMUSCULAR | Status: DC | PRN
  Administered 2014-03-03: 100 ug via INTRAVENOUS
  Administered 2014-03-03 (×2): 50 ug via INTRAVENOUS
  Administered 2014-03-03: 100 ug via INTRAVENOUS
  Administered 2014-03-03: 50 ug via INTRAVENOUS

## 2014-03-03 MED ORDER — HYDROMORPHONE HCL 1 MG/ML IJ SOLN: 1.0000 mg | INTRAMUSCULAR | Status: DC | PRN

## 2014-03-03 MED ORDER — LACTATED RINGERS IV SOLN
INTRAVENOUS | Status: DC
  Administered 2014-03-03: 1000 mL via INTRAVENOUS
  Administered 2014-03-03: 14:00:00 via INTRAVENOUS

## 2014-03-03 MED ORDER — PROPOFOL 10 MG/ML IV BOLUS
INTRAVENOUS | Status: DC | PRN
  Administered 2014-03-03: 170 mg via INTRAVENOUS
  Administered 2014-03-03: 30 mg via INTRAVENOUS

## 2014-03-03 MED ORDER — ESMOLOL HCL 10 MG/ML IV SOLN
INTRAVENOUS | Status: AC
  Filled 2014-03-03: qty 10

## 2014-03-03 MED ORDER — MIDAZOLAM HCL 5 MG/5ML IJ SOLN
INTRAMUSCULAR | Status: DC | PRN
  Administered 2014-03-03: 2 mg via INTRAVENOUS

## 2014-03-03 MED ORDER — NEOSTIGMINE METHYLSULFATE 10 MG/10ML IV SOLN
INTRAVENOUS | Status: AC
Start: 2014-03-03 — End: 2014-03-03
  Filled 2014-03-03: qty 1

## 2014-03-03 MED ORDER — HYDROMORPHONE HCL 1 MG/ML IJ SOLN
INTRAMUSCULAR | Status: AC
  Filled 2014-03-03: qty 1

## 2014-03-03 MED ORDER — GLYCOPYRROLATE 0.2 MG/ML IJ SOLN
INTRAMUSCULAR | Status: DC | PRN
  Administered 2014-03-03: 0.6 mg via INTRAVENOUS

## 2014-03-03 MED ORDER — DEXAMETHASONE SODIUM PHOSPHATE 10 MG/ML IJ SOLN
INTRAMUSCULAR | Status: DC | PRN
  Administered 2014-03-03: 10 mg via INTRAVENOUS

## 2014-03-03 MED ORDER — LIDOCAINE HCL (CARDIAC) 20 MG/ML IV SOLN
INTRAVENOUS | Status: AC
  Filled 2014-03-03: qty 5

## 2014-03-03 MED ORDER — LIDOCAINE HCL (CARDIAC) 20 MG/ML IV SOLN
INTRAVENOUS | Status: DC | PRN
  Administered 2014-03-03: 100 mg via INTRAVENOUS

## 2014-03-03 MED ORDER — CIPROFLOXACIN IN D5W 400 MG/200ML IV SOLN
400.0000 mg | Freq: Two times a day (BID) | INTRAVENOUS | Status: DC
  Administered 2014-03-03: 400 mg via INTRAVENOUS
  Filled 2014-03-03 (×2): qty 200

## 2014-03-03 MED ORDER — ROCURONIUM BROMIDE 100 MG/10ML IV SOLN
INTRAVENOUS | Status: AC
  Filled 2014-03-03: qty 1

## 2014-03-03 MED ORDER — MIDAZOLAM HCL 2 MG/2ML IJ SOLN
INTRAMUSCULAR | Status: AC
  Filled 2014-03-03: qty 2

## 2014-03-03 MED ORDER — KCL IN DEXTROSE-NACL 20-5-0.45 MEQ/L-%-% IV SOLN
INTRAVENOUS | Status: DC
  Administered 2014-03-03: 17:00:00 via INTRAVENOUS
  Filled 2014-03-03 (×2): qty 1000

## 2014-03-03 MED ORDER — BUPIVACAINE-EPINEPHRINE 0.25% -1:200000 IJ SOLN
INTRAMUSCULAR | Status: DC | PRN
  Administered 2014-03-03: 20 mL

## 2014-03-03 MED ORDER — ONDANSETRON HCL 4 MG/2ML IJ SOLN: 4.0000 mg | Freq: Once | INTRAMUSCULAR | Status: DC | PRN

## 2014-03-03 MED ORDER — ACETAMINOPHEN 325 MG PO TABS: 650.0000 mg | ORAL_TABLET | ORAL | Status: DC | PRN

## 2014-03-03 MED ORDER — LACTATED RINGERS IR SOLN
Status: DC | PRN
  Administered 2014-03-03: 2000 mL

## 2014-03-03 MED ORDER — PROPOFOL 10 MG/ML IV BOLUS
INTRAVENOUS | Status: AC
  Filled 2014-03-03: qty 20

## 2014-03-03 MED ORDER — DEXTROSE-NACL 5-0.9 % IV SOLN
INTRAVENOUS | Status: DC
  Administered 2014-03-03: 75 mL/h via INTRAVENOUS

## 2014-03-03 MED ORDER — HYDROMORPHONE HCL 1 MG/ML IJ SOLN
0.2500 mg | INTRAMUSCULAR | Status: DC | PRN
  Administered 2014-03-03: 0.5 mg via INTRAVENOUS

## 2014-03-03 MED ORDER — ACETAMINOPHEN 10 MG/ML IV SOLN
1000.0000 mg | INTRAVENOUS | Status: AC
  Administered 2014-03-03: 1000 mg via INTRAVENOUS
  Filled 2014-03-03: qty 100

## 2014-03-03 MED ORDER — SODIUM CHLORIDE 0.9 % IR SOLN
Status: DC | PRN
  Administered 2014-03-03: 1000 mL

## 2014-03-03 MED ORDER — ONDANSETRON HCL 4 MG/2ML IJ SOLN
INTRAMUSCULAR | Status: DC | PRN
  Administered 2014-03-03: 4 mg via INTRAVENOUS

## 2014-03-03 MED ORDER — NEOSTIGMINE METHYLSULFATE 10 MG/10ML IV SOLN
INTRAVENOUS | Status: DC | PRN
  Administered 2014-03-03: 5 mg via INTRAVENOUS

## 2014-03-03 MED ORDER — BUPIVACAINE-EPINEPHRINE (PF) 0.25% -1:200000 IJ SOLN
INTRAMUSCULAR | Status: AC
  Filled 2014-03-03: qty 30

## 2014-03-03 MED ORDER — SUCCINYLCHOLINE CHLORIDE 20 MG/ML IJ SOLN
INTRAMUSCULAR | Status: DC | PRN
  Administered 2014-03-03: 100 mg via INTRAVENOUS

## 2014-03-03 MED ORDER — ROCURONIUM BROMIDE 100 MG/10ML IV SOLN
INTRAVENOUS | Status: DC | PRN
  Administered 2014-03-03: 35 mg via INTRAVENOUS
  Administered 2014-03-03: 5 mg via INTRAVENOUS
  Administered 2014-03-03: 10 mg via INTRAVENOUS

## 2014-03-03 MED ORDER — IOHEXOL 300 MG/ML  SOLN
INTRAMUSCULAR | Status: DC | PRN
  Administered 2014-03-03: 6 mL via INTRAVENOUS

## 2014-03-03 MED ORDER — ONDANSETRON HCL 4 MG/2ML IJ SOLN
INTRAMUSCULAR | Status: AC
  Filled 2014-03-03: qty 2

## 2014-03-03 MED ORDER — FENTANYL CITRATE 0.05 MG/ML IJ SOLN
INTRAMUSCULAR | Status: AC
  Filled 2014-03-03: qty 5

## 2014-03-03 MED ORDER — GLYCOPYRROLATE 0.2 MG/ML IJ SOLN
INTRAMUSCULAR | Status: AC
  Filled 2014-03-03: qty 3

## 2014-03-03 MED ORDER — OXYCODONE-ACETAMINOPHEN 5-325 MG PO TABS
1.0000 | ORAL_TABLET | ORAL | Status: DC | PRN
  Administered 2014-03-03: 2 via ORAL
  Filled 2014-03-03: qty 2

## 2014-03-03 MED ORDER — FENTANYL CITRATE 0.05 MG/ML IJ SOLN
INTRAMUSCULAR | Status: AC
  Filled 2014-03-03: qty 2

## 2014-03-03 SURGICAL SUPPLY — 45 items
APL SKNCLS STERI-STRIP NONHPOA (GAUZE/BANDAGES/DRESSINGS) ×1
APPLIER CLIP ROT 10 11.4 M/L (STAPLE) ×3
APR CLP MED LRG 11.4X10 (STAPLE) ×1
BAG SPEC RTRVL LRG 6X4 10 (ENDOMECHANICALS) ×1
BENZOIN TINCTURE PRP APPL 2/3 (GAUZE/BANDAGES/DRESSINGS) ×3 IMPLANT
CABLE HIGH FREQUENCY MONO STRZ (ELECTRODE) ×3 IMPLANT
CHLORAPREP W/TINT 26ML (MISCELLANEOUS) ×3 IMPLANT
CLIP APPLIE ROT 10 11.4 M/L (STAPLE) ×1 IMPLANT
CLOSURE WOUND 1/2 X4 (GAUZE/BANDAGES/DRESSINGS) ×1
COVER MAYO STAND STRL (DRAPES) ×3 IMPLANT
DECANTER SPIKE VIAL GLASS SM (MISCELLANEOUS) ×1 IMPLANT
DRAPE C-ARM 42X120 X-RAY (DRAPES) ×3 IMPLANT
DRAPE LAPAROSCOPIC ABDOMINAL (DRAPES) ×3 IMPLANT
DRAPE UTILITY XL STRL (DRAPES) ×3 IMPLANT
ELECT REM PT RETURN 9FT ADLT (ELECTROSURGICAL) ×3
ELECTRODE REM PT RTRN 9FT ADLT (ELECTROSURGICAL) ×1 IMPLANT
GAUZE SPONGE 2X2 8PLY STRL LF (GAUZE/BANDAGES/DRESSINGS) ×1 IMPLANT
GLOVE BIOGEL PI IND STRL 6.5 (GLOVE) IMPLANT
GLOVE BIOGEL PI IND STRL 7.0 (GLOVE) IMPLANT
GLOVE BIOGEL PI INDICATOR 6.5 (GLOVE) ×2
GLOVE BIOGEL PI INDICATOR 7.0 (GLOVE) ×2
GLOVE SURG ORTHO 8.0 STRL STRW (GLOVE) ×3 IMPLANT
GLOVE SURG SIGNA 7.5 PF LTX (GLOVE) ×2 IMPLANT
GLOVE SURG SS PI 6.5 STRL IVOR (GLOVE) ×4 IMPLANT
GOWN STRL REUS W/TWL XL LVL3 (GOWN DISPOSABLE) ×10 IMPLANT
HEMOSTAT SNOW SURGICEL 2X4 (HEMOSTASIS) ×4 IMPLANT
HEMOSTAT SURGICEL 4X8 (HEMOSTASIS) IMPLANT
KIT BASIN OR (CUSTOM PROCEDURE TRAY) ×3 IMPLANT
LIQUID BAND (GAUZE/BANDAGES/DRESSINGS) ×2 IMPLANT
NS IRRIG 1000ML POUR BTL (IV SOLUTION) ×3 IMPLANT
POUCH SPECIMEN RETRIEVAL 10MM (ENDOMECHANICALS) ×3 IMPLANT
SCISSORS LAP 5X35 DISP (ENDOMECHANICALS) ×3 IMPLANT
SET CHOLANGIOGRAPH MIX (MISCELLANEOUS) ×3 IMPLANT
SET IRRIG TUBING LAPAROSCOPIC (IRRIGATION / IRRIGATOR) ×3 IMPLANT
SLEEVE XCEL OPT CAN 5 100 (ENDOMECHANICALS) ×3 IMPLANT
SPONGE GAUZE 2X2 STER 10/PKG (GAUZE/BANDAGES/DRESSINGS) ×2
STRIP CLOSURE SKIN 1/2X4 (GAUZE/BANDAGES/DRESSINGS) ×2 IMPLANT
SUT MNCRL AB 4-0 PS2 18 (SUTURE) ×3 IMPLANT
TAPE CLOTH SURG 4X10 WHT LF (GAUZE/BANDAGES/DRESSINGS) ×2 IMPLANT
TOWEL OR 17X26 10 PK STRL BLUE (TOWEL DISPOSABLE) ×3 IMPLANT
TOWEL OR NON WOVEN STRL DISP B (DISPOSABLE) ×3 IMPLANT
TRAY LAPAROSCOPIC (CUSTOM PROCEDURE TRAY) ×3 IMPLANT
TROCAR BLADELESS OPT 5 100 (ENDOMECHANICALS) ×3 IMPLANT
TROCAR XCEL BLUNT TIP 100MML (ENDOMECHANICALS) ×3 IMPLANT
TROCAR XCEL NON-BLD 11X100MML (ENDOMECHANICALS) ×3 IMPLANT

## 2014-03-03 NOTE — Anesthesia Procedure Notes (Signed)
Procedure Name: Intubation Date/Time: 03/03/2014 12:28 PM Performed by: Orest DikesPETERS, Bradley Majette J Pre-anesthesia Checklist: Patient identified, Emergency Drugs available, Suction available and Patient being monitored Patient Re-evaluated:Patient Re-evaluated prior to inductionOxygen Delivery Method: Circle system utilized Preoxygenation: Pre-oxygenation with 100% oxygen Intubation Type: IV induction Ventilation: Mask ventilation without difficulty Laryngoscope Size: Mac and 4 Grade View: Grade I Tube type: Oral Tube size: 7.5 mm Number of attempts: 1 Placement Confirmation: ETT inserted through vocal cords under direct vision,  breath sounds checked- equal and bilateral and positive ETCO2 Secured at: 21 cm Tube secured with: Tape Dental Injury: Teeth and Oropharynx as per pre-operative assessment

## 2014-03-03 NOTE — Anesthesia Postprocedure Evaluation (Signed)
  Anesthesia Post-op Note  Patient: [name]  Procedure(s) Performed: Procedure(s): LAPAROSCOPIC CHOLECYSTECTOMY WITH INTRAOPERATIVE CHOLANGIOGRAM (N/A)  Patient Location: PACU  Anesthesia Type:General  Level of Consciousness: awake, alert , oriented and patient cooperative  Airway and Oxygen Therapy: Patient Spontanous Breathing  Post-op Pain: mild  Post-op Assessment: Post-op Vital signs reviewed, Patient's Cardiovascular Status Stable, Respiratory Function Stable, Patent Airway, No signs of Nausea or vomiting and Pain level controlled  Post-op Vital Signs: stable  Last Vitals:  Filed Vitals:   03/03/14 1500  BP: 128/71  Pulse:   Temp: 36.9 C  Resp: 14    Complications: No apparent anesthesia complications

## 2014-03-03 NOTE — Brief Op Note (Signed)
03/02/2014 - 03/03/2014  2:22 PM  PATIENT:  [name]  54 y.o. male  PRE-OPERATIVE DIAGNOSIS:  Acute cholecystitis, cholelithiasis  POST-OPERATIVE DIAGNOSIS:  same  PROCEDURE:  Procedure(s): LAPAROSCOPIC CHOLECYSTECTOMY WITH INTRAOPERATIVE CHOLANGIOGRAM (N/A)  SURGEON:  Surgeon(s) and Role:    * Darnell Levelodd Leland Staszewski, MD - Primary    * Ovidio Kinavid Newman, MD - Assisting  ANESTHESIA:   general  EBL:  Total I/O In: 1200 [I.V.:1200] Out: 0   BLOOD ADMINISTERED:none  DRAINS: none   LOCAL MEDICATIONS USED:  MARCAINE     SPECIMEN:  Excision  DISPOSITION OF SPECIMEN:  PATHOLOGY  COUNTS:  YES  TOURNIQUET:  * No tourniquets in log *  DICTATION: .Other Dictation: Dictation Number [id number]  PLAN OF CARE: Admit for overnight observation  PATIENT DISPOSITION:  PACU - hemodynamically stable.   Delay start of Pharmacological VTE agent (>24hrs) due to surgical blood loss or risk of bleeding: yes  Bradley Hecklerodd M. Tommi Crepeau, MD, [facility] Office: [phone]

## 2014-03-03 NOTE — Progress Notes (Signed)
Patient ID: [name], male   DOB: 21-Oct-1960, 54 y.o.   MRN: [id number]  [facility]  POD#: 0  Subjective: Patient complains of pain in abdomen, headache.  Wife at bedside.  Objective: Vital signs in last 24 hours: Temp:  [98.9 F (37.2 C)-100.9 F (38.3 C)] 100.9 F (38.3 C) (02/05 0540) Pulse Rate:  [84-103] 103 (02/05 0540) Resp:  [20] 20 (02/05 0540) BP: (110-153)/(63-93) 110/63 mmHg (02/05 0540) SpO2:  [91 %-98 %] 91 % (02/05 0540) Weight:  [243 lb 8 oz (110.451 kg)-250 lb (113.399 kg)] 243 lb 8 oz (110.451 kg) (02/04 2153) Last BM Date: 03/02/14  Intake/Output from previous day: 02/04 0701 - 02/05 0700 In: 590 [I.V.:390; IV Piggyback:200] Out: 1150 [Urine:1150] Intake/Output this shift:    Physical Exam: HEENT - sclerae clear, mucous membranes moist Neck - soft Chest - clear bilaterally Cor - RRR Abdomen - soft, obese; mild tender RUQ; positive Murphy's sign, no mass Ext - no edema, non-tender Neuro - alert & oriented, no focal deficits  Lab Results:   Recent Labs  03/02/14 1824 03/03/14 0453  WBC 16.7* 13.9*  HGB 15.0 13.9  HCT 44.2 41.6  PLT 285 260   BMET  Recent Labs  03/02/14 1824 03/03/14 0453  NA 136 136  K 3.8 3.7  CL 101 100  CO2 22 26  GLUCOSE 107* 130*  BUN 6 5*  CREATININE 0.68 0.67  CALCIUM 9.2 8.7   PT/INR No results for input(s): LABPROT, INR in the last 72 hours. Comprehensive Metabolic Panel:    Component Value Date/Time   NA 136 03/03/2014 0453   NA 136 03/02/2014 1824   K 3.7 03/03/2014 0453   K 3.8 03/02/2014 1824   CL 100 03/03/2014 0453   CL 101 03/02/2014 1824   CO2 26 03/03/2014 0453   CO2 22 03/02/2014 1824   BUN 5* 03/03/2014 0453   BUN 6 03/02/2014 1824   CREATININE 0.67 03/03/2014 0453   CREATININE 0.68 03/02/2014 1824   GLUCOSE 130* 03/03/2014 0453   GLUCOSE 107* 03/02/2014 1824   CALCIUM 8.7 03/03/2014 0453   CALCIUM 9.2 03/02/2014 1824   AST 14 03/03/2014  0453   AST 21 03/02/2014 1824   ALT 19 03/03/2014 0453   ALT 23 03/02/2014 1824   ALKPHOS 83 03/03/2014 0453   ALKPHOS 92 03/02/2014 1824   BILITOT 1.0 03/03/2014 0453   BILITOT 0.9 03/02/2014 1824   PROT 7.0 03/03/2014 0453   PROT 8.0 03/02/2014 1824   ALBUMIN 3.6 03/03/2014 0453   ALBUMIN 4.1 03/02/2014 1824    Studies/Results: US Abdomen Limited  03/02/2014   CLINICAL DATA:  Right upper quadrant pain  EXAM: US ABDOMEN LIMITED - RIGHT UPPER QUADRANT  COMPARISON:  None.  FINDINGS: Within the gallbladder, there are multiple echogenic foci which move and shadow consistent with gallstones. There is a 1.7 cm in length gallstone lodged in the neck region. This is the largest gallstones seen. There is no appreciable gallbladder wall thickening or pericholecystic fluid.  No sonographic Murphy sign noted.  Common bile duct:  Diameter: 6 mm. There is no intrahepatic or extrahepatic biliary duct dilatation.  Liver:  No focal lesion identified. Liver echogenicity is increased diffusely.  IMPRESSION: Cholelithiasis. There is a gallstone lodged at the gallbladder neck. No gallbladder wall thickening or pericholecystic fluid appreciated.  Liver echogenicity is diffusely increased, most likely due to hepatic steatosis. While no focal liver lesions are identified, it must be cautioned  that the sensitivity of ultrasound for focal liver lesions is diminished in this circumstance.   Electronically Signed   By: Bretta BangWilliam  Woodruff M.D.   On: 03/02/2014 16:54    Anti-infectives: Anti-infectives    Start     Dose/Rate Route Frequency Ordered Stop   03/02/14 2200  ciprofloxacin (CIPRO) IVPB 400 mg     400 mg200 mL/hr over 60 Minutes Intravenous Every 12 hours 03/02/14 2152        Assessment & Plans: Acute cholecystitis, cholelithiasis  Plan lap cholecystectomy with IOC today  IV Tylenol for headache now  The risks and benefits of the procedure have been discussed at length with the patient.  The patient  understands the proposed procedure, potential alternative treatments, and the course of recovery to be expected.  All of the patient's questions have been answered at this time.  The patient wishes to proceed with surgery.   Velora Hecklerodd M. Yenni Carra, MD, [facility] Office: [phone]   Timmie Calix Judie PetitM 03/03/2014

## 2014-03-03 NOTE — Anesthesia Preprocedure Evaluation (Signed)
Anesthesia Evaluation  Patient identified by MRN, date of birth, ID band Patient awake    Reviewed: Allergy & Precautions, NPO status , Patient's Chart, lab work & pertinent test results  Airway        Dental   Pulmonary          Cardiovascular + dysrhythmias Atrial Fibrillation     Neuro/Psych  Neuromuscular disease    GI/Hepatic   Endo/Other    Renal/GU      Musculoskeletal   Abdominal   Peds  Hematology  (+) anemia ,   Anesthesia Other Findings   Reproductive/Obstetrics                             Anesthesia Physical Anesthesia Plan  ASA: II  Anesthesia Plan: General   Post-op Pain Management:    Induction: Intravenous  Airway Management Planned: Oral ETT  Additional Equipment:   Intra-op Plan:   Post-operative Plan: Extubation in OR  Informed Consent: I have reviewed the patients History and Physical, chart, labs and discussed the procedure including the risks, benefits and alternatives for the proposed anesthesia with the patient or authorized representative who has indicated his/her understanding and acceptance.     Plan Discussed with: Anesthesiologist, CRNA and Surgeon  Anesthesia Plan Comments:         Anesthesia Quick Evaluation

## 2014-03-03 NOTE — Transfer of Care (Signed)
Immediate Anesthesia Transfer of Care Note  Patient: [name]  Procedure(s) Performed: Procedure(s) (LRB): LAPAROSCOPIC CHOLECYSTECTOMY WITH INTRAOPERATIVE CHOLANGIOGRAM (N/A)  Patient Location: PACU  Anesthesia Type: General  Level of Consciousness: sedated, patient cooperative and responds to stimulation  Airway & Oxygen Therapy: Patient Spontanous Breathing and Patient connected to face mask oxgen  Post-op Assessment: Report given to PACU RN and Post -op Vital signs reviewed and stable  Post vital signs: Reviewed and stable  Complications: No apparent anesthesia complications

## 2014-03-03 NOTE — Progress Notes (Signed)
UR completed 

## 2014-03-04 ENCOUNTER — Encounter (HOSPITAL_COMMUNITY): Payer: Self-pay | Admitting: Student

## 2014-03-04 LAB — URINE CULTURE
CULTURE: NO GROWTH
Colony Count: NO GROWTH

## 2014-03-04 NOTE — Plan of Care (Signed)
Problem: Discharge Progression Outcomes Goal: Steri-Strips applied Outcome: Not Met (add Reason) dsg remained intact

## 2014-03-04 NOTE — Op Note (Signed)
NAMGypsy Lucas:  Lucas, Bradley               ACCOUNT NO.:  [id number]  MEDICAL RECORD NO.:  [id number]  LOCATION:  1534                         [facility]  PHYSICIAN:  Velora Hecklerodd M Toben Acuna, MD      DATE OF BIRTH:  Nov 17, 1960  DATE OF PROCEDURE:  03/03/2014                              OPERATIVE REPORT   PREOPERATIVE DIAGNOSES:  Acute cholecystitis, cholelithiasis.  POSTOPERATIVE DIAGNOSES:  Acute cholecystitis, cholelithiasis.  PROCEDURE:  Laparoscopic cholecystectomy with intraoperative cholangiography.  SURGEON:  Velora Hecklerodd M. Dilpreet Faires, MD, FACS  ASSISTANT:  Ovidio Kinavid Newman, MD, FACS  ANESTHESIA:  General per Dr. Diamantina MonksGreg Smith.  ESTIMATED BLOOD LOSS:  150 mL.  PREPARATION:  ChloraPrep.  COMPLICATIONS:  None.  INDICATIONS:  The patient is a 54 year old male admitted to the hospital with abdominal pain and signs and symptoms of acute cholecystitis. Gallbladder ultrasound shows cholelithiasis with a stone impacted in the gallbladder neck.  White blood cell count was elevated at 16,000.  The patient now comes to the operating room for cholecystectomy.  BODY OF REPORT:  Pro[facility]  The patient was brought to the operating room, placed in supine position on the operating room table.  Following administration of general anesthesia, the patient was prepped and draped in the usual aseptic fashion.  After ascertaining that an adequate level of anesthesia had been achieved, an infraumbilical incision was made with a #15 blade.  Dissection was carried down to the fascia.  Fascia was incised in the midline and the peritoneal cavity was entered cautiously.  A 0 Vicryl pursestring suture was placed in the fascia.  A Hasson cannula was introduced under direct vision and secured with a pursestring suture.  Abdomen was insufflated with carbon dioxide. Laparoscope was introduced and the abdomen explored.  There was an inflammatory mass beneath the liver  covered with omentum.  Operative ports were placed along the right costal margin in the midline, midclavicular line, and anterior axillary line.  Omentum was mobilized off of the gallbladder to which it was densely adherent through inflammation.  Gallbladder wall was exposed.  Using an aspirating trocar, the gallbladder was punctured and dark brown bile was evacuated from the gallbladder.  Gallbladder was markedly thickened.  Wall was grasped and retracted cephalad.  Remainder of the omental peel has taken down off the surface of the gallbladder and the neck of the gallbladder was identified.  Cystic artery was identified, doubly clipped, and divided.  Cystic duct was dissected out.  A clip was placed at the neck of the gallbladder.  Cystic duct was incised and a small amount of mucoid bile emanates from the cystic duct.  A Cook cholangiography catheter was introduced through a stab wound in the right upper quadrant.  It was inserted into the cystic duct and secured with a Ligaclip.  Using C-arm fluoroscopy, real time cholangiography was performed.  There was rapid filling of a normal caliber biliary tree. Biliary tree was somewhat elongated and relatively straight.  It appears to insert into the 3rd portion of the duodenum.  There was reflux of contrast into the right and left hepatic ductal systems.  There is slight  reflux into the distal pancreatic duct.  There was no sign of filling defect or obstruction.  Clip was withdrawn and Cook catheter was removed from the peritoneal cavity.  Cystic duct was triply clipped and divided.  Posterior branch of the cystic artery was doubly clipped and divided.  Gallbladder was then excised from the gallbladder bed using the hook electrocautery for hemostasis.  Gallbladder was completely excised and placed into an EndoCatch bag.  It was withdrawn through the umbilical port with some difficulty, given the thick walled gallbladder and multiple  gallstones.  After removal, a 0 Vicryl pursestring suture was tied securely.  Right upper quadrant was irrigated with warm saline. Good hemostasis was noted.  Surgicel was placed in the bed of the gallbladder.  Good hemostasis was noted at the conclusion of the procedure.  The ports were removed under direct vision.  Good hemostasis was noted at all port sites.  Pneumoperitoneum was released.  Port sites were anesthetized with local anesthetic.  Wounds were closed with interrupted 4-0 Vicryl subcuticular sutures.  Wounds were washed and dried and benzoin and Steri-Strips were applied.  Sterile dressings were applied.  The patient was awakened from anesthesia and brought to the recovery room.  The patient tolerated the procedure well.   Velora Heckler, MD, [facility] Office: [phone]   TMG/MEDQ  D:  03/03/2014  T:  03/04/2014  Job:  [id number]

## 2014-03-04 NOTE — Progress Notes (Signed)
Utilization Review completed.  

## 2014-03-04 NOTE — Progress Notes (Signed)
Pt discharged to home. DC instructions given. No concerns voiced. No prescriptions given. Left unit in wheelchair pushed by Ivin BootyJoshua, nursing student, accompanied by significant other. Left in good condition. Vwilliams,rn.

## 2014-03-04 NOTE — Progress Notes (Signed)
1 Day Post-Op  Subjective: No complaints. He is ready to go home  Objective: Vital signs in last 24 hours: Temp:  [97.7 F (36.5 C)-98.9 F (37.2 C)] 97.8 F (36.6 C) (02/06 0700) Pulse Rate:  [61-87] 69 (02/06 0700) Resp:  [14-21] 18 (02/06 0700) BP: (102-137)/(58-79) 124/71 mmHg (02/06 0700) SpO2:  [93 %-100 %] 94 % (02/06 0700) Last BM Date: 03/02/14  Intake/Output from previous day: 02/05 0701 - 02/06 0700 In: 3789.2 [P.O.:1800; I.V.:1989.2] Out: 2300 [Urine:2300] Intake/Output this shift:    Resp: clear to auscultation bilaterally Cardio: regular rate and rhythm GI: soft, minimal tenderness. incisions ok  Lab Results:   Recent Labs  03/02/14 1824 03/03/14 0453  WBC 16.7* 13.9*  HGB 15.0 13.9  HCT 44.2 41.6  PLT 285 260   BMET  Recent Labs  03/02/14 1824 03/03/14 0453  NA 136 136  K 3.8 3.7  CL 101 100  CO2 22 26  GLUCOSE 107* 130*  BUN 6 5*  CREATININE 0.68 0.67  CALCIUM 9.2 8.7   PT/INR No results for input(s): LABPROT, INR in the last 72 hours. ABG No results for input(s): PHART, HCO3 in the last 72 hours.  Invalid input(s): PCO2, PO2  Studies/Results: Dg Cholangiogram Operative  03/03/2014   CLINICAL DATA:  Laparoscopic cholecystectomy.  EXAM: INTRAOPERATIVE CHOLANGIOGRAM  FLUOROSCOPY TIME:  18 seconds  COMPARISON:  Abdominal ultrasound - 03/02/2014  FINDINGS: Intraoperative angiographic images of the right upper abdominal quadrant during laparoscopic cholecystectomy are provided for review.  Surgical clips overlie the expected location of the gallbladder fossa.  Contrast injection demonstrates selective cannulation of the central aspect of the cystic duct.  There is passage of contrast through the central aspect of the cystic duct with filling of a non dilated common bile duct. There is passage of contrast though the CBD and into the descending portion of the duodenum.  There is minimal reflux of injected contrast into the common hepatic duct and  central aspect of the non dilated intrahepatic biliary system. There is minimal opacification of the central aspect of the pancreatic duct which appears nondilated.  There are no discrete filling defects within the opacified portions of the biliary system to suggest the presence of choledocholithiasis.  IMPRESSION: No evidence of choledocholithiasis.   Electronically Signed   By: Simonne Come M.D.   On: 03/03/2014 13:48   US Abdomen Limited  03/02/2014   CLINICAL DATA:  Right upper quadrant pain  EXAM: US ABDOMEN LIMITED - RIGHT UPPER QUADRANT  COMPARISON:  None.  FINDINGS: Within the gallbladder, there are multiple echogenic foci which move and shadow consistent with gallstones. There is a 1.7 cm in length gallstone lodged in the neck region. This is the largest gallstones seen. There is no appreciable gallbladder wall thickening or pericholecystic fluid.  No sonographic Murphy sign noted.  Common bile duct:  Diameter: 6 mm. There is no intrahepatic or extrahepatic biliary duct dilatation.  Liver:  No focal lesion identified. Liver echogenicity is increased diffusely.  IMPRESSION: Cholelithiasis. There is a gallstone lodged at the gallbladder neck. No gallbladder wall thickening or pericholecystic fluid appreciated.  Liver echogenicity is diffusely increased, most likely due to hepatic steatosis. While no focal liver lesions are identified, it must be cautioned that the sensitivity of ultrasound for focal liver lesions is diminished in this circumstance.   Electronically Signed   By: Bretta Bang M.D.   On: 03/02/2014 16:54    Anti-infectives: Anti-infectives    Start  Dose/Rate Route Frequency Ordered Stop   03/03/14 2200  ciprofloxacin (CIPRO) IVPB 400 mg     400 mg200 mL/hr over 60 Minutes Intravenous Every 12 hours 03/03/14 1515     03/02/14 2200  ciprofloxacin (CIPRO) IVPB 400 mg  Status:  Discontinued     400 mg200 mL/hr over 60 Minutes Intravenous Every 12 hours 03/02/14 2152 03/03/14 1515       Assessment/Plan: s/p Procedure(s): LAPAROSCOPIC CHOLECYSTECTOMY WITH INTRAOPERATIVE CHOLANGIOGRAM (N/A) Advance diet  Discharge  LOS: 2 days    TOTH III,PAUL S 03/04/2014

## 2014-03-06 ENCOUNTER — Encounter (HOSPITAL_COMMUNITY): Payer: Self-pay | Admitting: Surgery

## 2014-03-21 DIAGNOSIS — M2352 Chronic instability of knee, left knee: Secondary | ICD-10-CM | POA: Diagnosis not present

## 2014-03-27 DIAGNOSIS — M2352 Chronic instability of knee, left knee: Secondary | ICD-10-CM | POA: Diagnosis not present

## 2014-03-29 DIAGNOSIS — M2352 Chronic instability of knee, left knee: Secondary | ICD-10-CM | POA: Diagnosis not present

## 2014-04-04 DIAGNOSIS — M2352 Chronic instability of knee, left knee: Secondary | ICD-10-CM | POA: Diagnosis not present

## 2014-04-11 DIAGNOSIS — M2352 Chronic instability of knee, left knee: Secondary | ICD-10-CM | POA: Diagnosis not present

## 2014-04-13 NOTE — Discharge Summary (Signed)
Physician Discharge Summary [facility]  Patient ID: [name] MRN: [id number] DOB/AGE: 1960/02/15 54 y.o.  Admit date: 03/02/2014 Discharge date: 03/04/2014   Admission Diagnoses:  Acute cholecystitis, cholelithiasis  Discharge Diagnoses:  Principal Problem:   Acute cholecystitis Active Problems:   Cholelithiasis with acute cholecystitis   Discharged Condition: good  Hospital Course: The patient is a 54 year old male admitted to the hospital with abdominal pain and signs and symptoms of acute cholecystitis. Gallbladder ultrasound shows cholelithiasis with a stone impacted in the gallbladder neck. White blood cell count was elevated at 16,000. The patient now comes to the operating room for cholecystectomy.  Post op patient observed overnight.  Tolerated diet.  Pain controlled.  Prepared for discharge on POD#1.  Consults: None  Treatments: surgery: lap chole with IOC  Discharge Exam: Blood pressure 124/71, pulse 69, temperature 97.8 F (36.6 C), temperature source Oral, resp. rate 18, height  (1.753 m), weight 110.451 kg (243 lb 8 oz), SpO2 94 %. See progress note on discharge date.   Disposition: Home  Discharge Instructions    Call MD for:  difficulty breathing, headache or visual disturbances    Complete by:  As directed      Call MD for:  extreme fatigue    Complete by:  As directed      Call MD for:  hives    Complete by:  As directed      Call MD for:  persistant dizziness or light-headedness    Complete by:  As directed      Call MD for:  persistant nausea and vomiting    Complete by:  As directed      Call MD for:  redness, tenderness, or signs of infection (pain, swelling, redness, odor or green/yellow discharge around incision site)    Complete by:  As directed      Call MD for:  severe uncontrolled pain    Complete by:  As directed      Call MD for:  temperature >100.4    Complete by:  As directed      Diet - low sodium  heart healthy    Complete by:  As directed      Discharge instructions    Complete by:  As directed   May shower. No heavy lifting. Low fat diet     Increase activity slowly    Complete by:  As directed      No wound care    Complete by:  As directed             Medication List    TAKE these medications        aspirin 325 MG tablet  Take 325 mg by mouth daily.     docusate sodium 100 MG capsule  Commonly known as:  COLACE  Take 100 mg by mouth daily.     methocarbamol 500 MG tablet  Commonly known as:  ROBAXIN  Take 500 mg by mouth 3 (three) times daily.     oxyCODONE 15 MG immediate release tablet  Commonly known as:  ROXICODONE  Take 15 mg by mouth 3 (three) times daily.     oxyCODONE-acetaminophen 5-325 MG per tablet  Commonly known as:  ROXICET  Take 1-2 tablets by mouth every 6 (six) hours as needed for pain.     pregabalin 100 MG capsule  Commonly known as:  LYRICA  Take 100 mg by mouth 3 (three) times daily.  Follow-up Information    Follow up with Velora HecklerGERKIN,Taleigha Pinson M, MD In 2 weeks.   Specialty:  General Surgery   Contact information:   [address] [place] [address] [phone]       Velora Hecklerodd M. Yomira Flitton, MD, [facility] Office: [phone]   Signed: Velora HecklerGERKIN,Adiya Selmer M 04/13/2014, 9:18 AM

## 2014-10-19 DIAGNOSIS — M216X2 Other acquired deformities of left foot: Secondary | ICD-10-CM | POA: Diagnosis not present

## 2015-04-02 DIAGNOSIS — M216X2 Other acquired deformities of left foot: Secondary | ICD-10-CM | POA: Diagnosis not present

## 2015-04-12 DIAGNOSIS — G8929 Other chronic pain: Secondary | ICD-10-CM | POA: Insufficient documentation

## 2015-04-12 DIAGNOSIS — G8921 Chronic pain due to trauma: Secondary | ICD-10-CM | POA: Insufficient documentation

## 2015-04-12 DIAGNOSIS — M21372 Foot drop, left foot: Secondary | ICD-10-CM | POA: Insufficient documentation

## 2015-04-12 DIAGNOSIS — E6609 Other obesity due to excess calories: Secondary | ICD-10-CM | POA: Insufficient documentation

## 2015-04-12 DIAGNOSIS — S83512A Sprain of anterior cruciate ligament of left knee, initial encounter: Secondary | ICD-10-CM | POA: Insufficient documentation

## 2015-04-12 DIAGNOSIS — Z96642 Presence of left artificial hip joint: Secondary | ICD-10-CM | POA: Insufficient documentation

## 2015-05-30 ENCOUNTER — Other Ambulatory Visit: Payer: Self-pay | Admitting: Gastroenterology

## 2015-06-21 ENCOUNTER — Encounter (HOSPITAL_COMMUNITY): Payer: Self-pay | Admitting: *Deleted

## 2015-07-18 ENCOUNTER — Encounter (HOSPITAL_COMMUNITY): Payer: Self-pay | Admitting: *Deleted

## 2015-07-26 ENCOUNTER — Ambulatory Visit (HOSPITAL_COMMUNITY)
Admission: RE | Admit: 2015-07-26 | Payer: PRIVATE HEALTH INSURANCE | Source: Ambulatory Visit | Admitting: Gastroenterology

## 2015-07-26 SURGERY — COLONOSCOPY WITH PROPOFOL
Anesthesia: Monitor Anesthesia Care

## 2015-08-02 ENCOUNTER — Other Ambulatory Visit: Payer: Self-pay | Admitting: Gastroenterology

## 2015-08-22 ENCOUNTER — Encounter (HOSPITAL_COMMUNITY): Payer: Self-pay | Admitting: *Deleted

## 2015-08-27 ENCOUNTER — Encounter (HOSPITAL_COMMUNITY): Payer: Self-pay | Admitting: *Deleted

## 2015-08-30 ENCOUNTER — Ambulatory Visit (HOSPITAL_COMMUNITY): Payer: PRIVATE HEALTH INSURANCE | Admitting: Anesthesiology

## 2015-08-30 ENCOUNTER — Encounter (HOSPITAL_COMMUNITY): Payer: Self-pay

## 2015-08-30 ENCOUNTER — Ambulatory Visit (HOSPITAL_COMMUNITY)
Admission: RE | Admit: 2015-08-30 | Discharge: 2015-08-30 | Disposition: A | Discharge status: Home / Self Care | Payer: PRIVATE HEALTH INSURANCE | Source: Ambulatory Visit | Attending: Gastroenterology | Admitting: Gastroenterology

## 2015-08-30 ENCOUNTER — Encounter (HOSPITAL_COMMUNITY)
Admission: RE | Disposition: A | Discharge status: Home / Self Care | Payer: Self-pay | Source: Ambulatory Visit | Attending: Gastroenterology

## 2015-08-30 DIAGNOSIS — Z7982 Long term (current) use of aspirin: Secondary | ICD-10-CM | POA: Insufficient documentation

## 2015-08-30 DIAGNOSIS — Z96642 Presence of left artificial hip joint: Secondary | ICD-10-CM | POA: Insufficient documentation

## 2015-08-30 DIAGNOSIS — Z79891 Long term (current) use of opiate analgesic: Secondary | ICD-10-CM | POA: Insufficient documentation

## 2015-08-30 DIAGNOSIS — Z6839 Body mass index (BMI) 39.0-39.9, adult: Secondary | ICD-10-CM | POA: Insufficient documentation

## 2015-08-30 DIAGNOSIS — K5730 Diverticulosis of large intestine without perforation or abscess without bleeding: Secondary | ICD-10-CM | POA: Insufficient documentation

## 2015-08-30 DIAGNOSIS — Z1211 Encounter for screening for malignant neoplasm of colon: Secondary | ICD-10-CM | POA: Insufficient documentation

## 2015-08-30 DIAGNOSIS — I4891 Unspecified atrial fibrillation: Secondary | ICD-10-CM | POA: Insufficient documentation

## 2015-08-30 DIAGNOSIS — Z87891 Personal history of nicotine dependence: Secondary | ICD-10-CM | POA: Insufficient documentation

## 2015-08-30 DIAGNOSIS — K5790 Diverticulosis of intestine, part unspecified, without perforation or abscess without bleeding: Secondary | ICD-10-CM | POA: Diagnosis not present

## 2015-08-30 DIAGNOSIS — E669 Obesity, unspecified: Secondary | ICD-10-CM | POA: Diagnosis not present

## 2015-08-30 HISTORY — PX: COLONOSCOPY WITH PROPOFOL: SHX5780

## 2015-08-30 SURGERY — COLONOSCOPY WITH PROPOFOL
Anesthesia: Monitor Anesthesia Care

## 2015-08-30 MED ORDER — PROPOFOL 10 MG/ML IV BOLUS
INTRAVENOUS | Status: AC
  Filled 2015-08-30: qty 60

## 2015-08-30 MED ORDER — LACTATED RINGERS IV SOLN
INTRAVENOUS | Status: DC | PRN
  Administered 2015-08-30: 07:00:00 via INTRAVENOUS

## 2015-08-30 MED ORDER — LACTATED RINGERS IV SOLN: INTRAVENOUS | Status: DC

## 2015-08-30 MED ORDER — PROPOFOL 500 MG/50ML IV EMUL
INTRAVENOUS | Status: DC | PRN
  Administered 2015-08-30: 200 ug/kg/min via INTRAVENOUS

## 2015-08-30 SURGICAL SUPPLY — 22 items

## 2015-08-30 NOTE — Discharge Instructions (Signed)
Colonoscopy, Care After °Refer to this sheet in the next few weeks. These instructions provide you with information on caring for yourself after your procedure. Your health care provider may also give you more specific instructions. Your treatment has been planned according to current medical practices, but problems sometimes occur. Call your health care provider if you have any problems or questions after your procedure. °WHAT TO EXPECT AFTER THE PROCEDURE  °After your procedure, it is typical to have the following: °· A small amount of blood in your stool. °· Moderate amounts of gas and mild abdominal cramping or bloating. °HOME CARE INSTRUCTIONS °· Do not drive, operate machinery, or sign important documents for 24 hours. °· You may shower and resume your regular physical activities, but move at a slower pace for the first 24 hours. °· Take frequent rest periods for the first 24 hours. °· Walk around or put a warm pack on your abdomen to help reduce abdominal cramping and bloating. °· Drink enough fluids to keep your urine clear or pale yellow. °· You may resume your normal diet as instructed by your health care provider. Avoid heavy or fried foods that are hard to digest. °· Avoid drinking alcohol for 24 hours or as instructed by your health care provider. °· Only take over-the-counter or prescription medicines as directed by your health care provider. °· If a tissue sample (biopsy) was taken during your procedure: °¨ Do not take aspirin or blood thinners for 7 days, or as instructed by your health care provider. °¨ Do not drink alcohol for 7 days, or as instructed by your health care provider. °¨ Eat soft foods for the first 24 hours. °SEEK MEDICAL CARE IF: °You have persistent spotting of blood in your stool 2-3 days after the procedure. °SEEK IMMEDIATE MEDICAL CARE IF: °· You have more than a small spotting of blood in your stool. °· You pass large blood clots in your stool. °· Your abdomen is swollen  (distended). °· You have nausea or vomiting. °· You have a fever. °· You have increasing abdominal pain that is not relieved with medicine. °  °This information is not intended to replace advice given to you by your health care provider. Make sure you discuss any questions you have with your health care provider. °  °Document Released: 08/28/2003 Document Revised: 11/03/2012 Document Reviewed: 09/20/2012 °Elsevier Interactive Patient Education ©2016 [employer]. ° °

## 2015-08-30 NOTE — Op Note (Signed)
[facility] Patient Name: [name] Procedure Date: 08/30/2015 MRN: [id number] Attending MD: Charna Elizabeth , MD Date of Birth: Dec 26, 1960 CSN: [id number] Age: 55 Admit Type: Outpatient Procedure:                Screening colonoscopy. Indications:              CRC screening for colorectal malignant neoplasm Providers:                Charna Elizabeth, MD, Omelia Blackwater RN, RN, Lorenda Ishihara, Technician, Anastasio Champion, CRNA. Referring MD:             Delorse Lek, MD Medicines:                Monitored Anesthesia Care Complications:            No immediate complications. Estimated Blood Loss:     Estimated blood loss: none. Procedure:                Pre-anesthesia assessment: - Prior to the                            procedure, a history and physical was performed,                            and patient medications and allergies were                            reviewed. The patient's tolerance of previous                            anesthesia was also reviewed. The risks and                            benefits of the procedure and the sedation options                            and risks were discussed with the patient. All                            questions were answered, and informed consent was                            obtained. Prior anticoagulants: The patient has                            taken no previous anticoagulant or antiplatelet                            agents. ASA Grade Assessment: II - A patient with                            mild systemic disease. After reviewing the risks  and benefits, the patient was deemed in                            satisfactory condition to undergo the procedure.                            After obtaining informed consent, the colonoscope                            was passed under direct vision. Throughout the                            procedure, the patient's blood  pressure, pulse, and                            oxygen saturations were monitored continuously. The                            EC-[id number] ([id number]) scope was introduced through                            the anus and advanced to the the cecum, identified                            by appendiceal orifice and ileocecal valve. The                            colonoscopy was performed without difficulty. The                            patient tolerated the procedure well. The quality                            of the bowel preparation was adequate. The                            ileocecal valve, the appendiceal orifice and the                            rectum were photographed. The bowel preparation                            used was GoLYTELY. Scope In: 7:18:56 AM Scope Out: 7:34:50 AM Scope Withdrawal Time: 0 hours 8 minutes 19 seconds  Total Procedure Duration: 0 hours 15 minutes 54 seconds  Findings:      A few small and large-mouthed diverticula were found in the entire colon.      The exam was otherwise without abnormality on direct and retroflexion       views. Impression:               - Scattered diverticulosis in the entire examined                            colon.                           -  The examination was otherwise normal on direct                            and retroflexion views.                           - No specimens collected. Moderate Sedation:      MAC.given. Recommendation:           - High fiber diet with augmented water consumption                            daily.                           - Continue present medications.                           - Repeat colonoscopy in 10 years for surveillance.                           - Return to GI office PRN.                           - If the patient has any abnormal GI symptoms in                            the interim, he has been advised to call the office                            ASAP for further  recommendations. Procedure Code(s):        --- Professional ---                           [phone], Colonoscopy, flexible; diagnostic, including                            collection of specimen(s) by brushing or washing,                            when performed (separate procedure) Diagnosis Code(s):        --- Professional ---                           Z12.11, Encounter for screening for malignant                            neoplasm of colon                           K57.30, Diverticulosis of large intestine without                            perforation or abscess without bleeding CPT copyright 2016 American Medical Association. All rights reserved. The codes documented in this report are preliminary and upon coder review may  be revised to meet current compliance requirements. [name]  [address], MD Charna Elizabeth, MD 08/30/2015 7:45:55 AM This report has been signed electronically. Number of Addenda: 0

## 2015-08-30 NOTE — Anesthesia Preprocedure Evaluation (Addendum)
Anesthesia Evaluation  Patient identified by MRN, date of birth, ID band Patient awake    Reviewed: Allergy & Precautions, NPO status , Patient's Chart, lab work & pertinent test results  Airway Mallampati: II  TM Distance: >3 FB Neck ROM: Full    Dental  (+) Dental Advisory Given   Pulmonary former smoker,    Pulmonary exam normal        Cardiovascular negative cardio ROS   Rhythm:Regular Rate:Normal     Neuro/Psych  Neuromuscular disease    GI/Hepatic negative GI ROS, Neg liver ROS,   Endo/Other  negative endocrine ROS  Renal/GU negative Renal ROS     Musculoskeletal   Abdominal (+) + obese,   Peds  Hematology negative hematology ROS (+)   Anesthesia Other Findings   Reproductive/Obstetrics                            Anesthesia Physical Anesthesia Plan  ASA: II  Anesthesia Plan: MAC   Post-op Pain Management:    Induction: Intravenous  Airway Management Planned: Natural Airway and Simple Face Mask  Additional Equipment:   Intra-op Plan:   Post-operative Plan:   Informed Consent: I have reviewed the patients History and Physical, chart, labs and discussed the procedure including the risks, benefits and alternatives for the proposed anesthesia with the patient or authorized representative who has indicated his/her understanding and acceptance.     Plan Discussed with: CRNA  Anesthesia Plan Comments:         Anesthesia Quick Evaluation
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
## 2015-08-30 NOTE — Transfer of Care (Signed)
Immediate Anesthesia Transfer of Care Note  Patient: [name]  Procedure(s) Performed: Procedure(s): COLONOSCOPY WITH PROPOFOL (N/A)  Patient Location: PACU  Anesthesia Type:MAC  Level of Consciousness: sedated, patient cooperative and responds to stimulation  Airway & Oxygen Therapy: Patient Spontanous Breathing and Patient connected to face mask oxygen  Post-op Assessment: Report given to RN, Post -op Vital signs reviewed and stable and Patient moving all extremities X 4  Post vital signs: stable  Last Vitals:  Vitals:   08/30/15 0623 08/30/15 0743  BP: 127/82 (!) 99/57  Pulse: (!) 59 63  Resp: 14 14  Temp: 37.1 C 36.6 C    Last Pain:  Vitals:   08/30/15 0743  TempSrc: Oral         Complications: No apparent anesthesia complications

## 2015-08-30 NOTE — Anesthesia Postprocedure Evaluation (Signed)
Anesthesia Post Note  Patient: [name]  Procedure(s) Performed: Procedure(s) (LRB): COLONOSCOPY WITH PROPOFOL (N/A)  Patient location during evaluation: PACU Anesthesia Type: MAC Level of consciousness: awake and alert Pain management: pain level controlled Vital Signs Assessment: post-procedure vital signs reviewed and stable Respiratory status: spontaneous breathing, nonlabored ventilation, respiratory function stable and patient connected to nasal cannula oxygen Cardiovascular status: stable and blood pressure returned to baseline Anesthetic complications: no    Last Vitals:  Vitals:   08/30/15 0750 08/30/15 0800  BP: 119/71 133/66  Pulse:    Resp:  17  Temp:      Last Pain:  Vitals:   08/30/15 0743  TempSrc: Osvaldo Angst                 Kennieth Rad

## 2015-08-31 ENCOUNTER — Encounter (HOSPITAL_COMMUNITY): Payer: Self-pay | Admitting: Gastroenterology

## 2016-03-24 DIAGNOSIS — R6882 Decreased libido: Secondary | ICD-10-CM | POA: Diagnosis not present

## 2016-03-24 DIAGNOSIS — N5201 Erectile dysfunction due to arterial insufficiency: Secondary | ICD-10-CM | POA: Diagnosis not present

## 2016-03-28 DIAGNOSIS — E291 Testicular hypofunction: Secondary | ICD-10-CM | POA: Diagnosis not present

## 2016-04-14 DIAGNOSIS — E291 Testicular hypofunction: Secondary | ICD-10-CM | POA: Diagnosis not present

## 2016-04-14 DIAGNOSIS — N5201 Erectile dysfunction due to arterial insufficiency: Secondary | ICD-10-CM | POA: Diagnosis not present

## 2016-05-05 DIAGNOSIS — S32402D Unspecified fracture of left acetabulum, subsequent encounter for fracture with routine healing: Secondary | ICD-10-CM | POA: Diagnosis not present

## 2016-05-05 DIAGNOSIS — S83512A Sprain of anterior cruciate ligament of left knee, initial encounter: Secondary | ICD-10-CM | POA: Diagnosis not present

## 2016-05-05 DIAGNOSIS — S32810A Multiple fractures of pelvis with stable disruption of pelvic ring, initial encounter for closed fracture: Secondary | ICD-10-CM | POA: Diagnosis not present

## 2016-05-05 DIAGNOSIS — S82302B Unspecified fracture of lower end of left tibia, initial encounter for open fracture type I or II: Secondary | ICD-10-CM | POA: Diagnosis not present

## 2016-05-05 DIAGNOSIS — Z4789 Encounter for other orthopedic aftercare: Secondary | ICD-10-CM | POA: Diagnosis not present

## 2016-05-22 DIAGNOSIS — E291 Testicular hypofunction: Secondary | ICD-10-CM | POA: Diagnosis not present

## 2016-07-09 DIAGNOSIS — M25552 Pain in left hip: Secondary | ICD-10-CM | POA: Diagnosis not present

## 2016-07-09 DIAGNOSIS — M25572 Pain in left ankle and joints of left foot: Secondary | ICD-10-CM | POA: Diagnosis not present

## 2016-07-09 DIAGNOSIS — M5416 Radiculopathy, lumbar region: Secondary | ICD-10-CM | POA: Diagnosis not present

## 2016-07-09 DIAGNOSIS — M25562 Pain in left knee: Secondary | ICD-10-CM | POA: Diagnosis not present

## 2016-07-11 DIAGNOSIS — E291 Testicular hypofunction: Secondary | ICD-10-CM | POA: Diagnosis not present

## 2016-07-15 DIAGNOSIS — M25562 Pain in left knee: Secondary | ICD-10-CM | POA: Diagnosis not present

## 2016-07-15 DIAGNOSIS — M25552 Pain in left hip: Secondary | ICD-10-CM | POA: Diagnosis not present

## 2016-07-15 DIAGNOSIS — M25572 Pain in left ankle and joints of left foot: Secondary | ICD-10-CM | POA: Diagnosis not present

## 2016-07-15 DIAGNOSIS — M5416 Radiculopathy, lumbar region: Secondary | ICD-10-CM | POA: Diagnosis not present

## 2016-07-17 DIAGNOSIS — M25562 Pain in left knee: Secondary | ICD-10-CM | POA: Diagnosis not present

## 2016-07-17 DIAGNOSIS — M25572 Pain in left ankle and joints of left foot: Secondary | ICD-10-CM | POA: Diagnosis not present

## 2016-07-17 DIAGNOSIS — M5416 Radiculopathy, lumbar region: Secondary | ICD-10-CM | POA: Diagnosis not present

## 2016-07-17 DIAGNOSIS — M25552 Pain in left hip: Secondary | ICD-10-CM | POA: Diagnosis not present

## 2016-07-23 DIAGNOSIS — M25572 Pain in left ankle and joints of left foot: Secondary | ICD-10-CM | POA: Diagnosis not present

## 2016-07-23 DIAGNOSIS — M25562 Pain in left knee: Secondary | ICD-10-CM | POA: Diagnosis not present

## 2016-07-23 DIAGNOSIS — M5416 Radiculopathy, lumbar region: Secondary | ICD-10-CM | POA: Diagnosis not present

## 2016-07-23 DIAGNOSIS — M25552 Pain in left hip: Secondary | ICD-10-CM | POA: Diagnosis not present

## 2016-07-28 DIAGNOSIS — M25552 Pain in left hip: Secondary | ICD-10-CM | POA: Diagnosis not present

## 2016-07-28 DIAGNOSIS — M25562 Pain in left knee: Secondary | ICD-10-CM | POA: Diagnosis not present

## 2016-07-28 DIAGNOSIS — M25572 Pain in left ankle and joints of left foot: Secondary | ICD-10-CM | POA: Diagnosis not present

## 2016-07-28 DIAGNOSIS — M5416 Radiculopathy, lumbar region: Secondary | ICD-10-CM | POA: Diagnosis not present

## 2016-07-31 DIAGNOSIS — M5416 Radiculopathy, lumbar region: Secondary | ICD-10-CM | POA: Diagnosis not present

## 2016-07-31 DIAGNOSIS — M25572 Pain in left ankle and joints of left foot: Secondary | ICD-10-CM | POA: Diagnosis not present

## 2016-07-31 DIAGNOSIS — M25552 Pain in left hip: Secondary | ICD-10-CM | POA: Diagnosis not present

## 2016-07-31 DIAGNOSIS — M25562 Pain in left knee: Secondary | ICD-10-CM | POA: Diagnosis not present

## 2016-08-22 DIAGNOSIS — M25552 Pain in left hip: Secondary | ICD-10-CM | POA: Diagnosis not present

## 2016-08-22 DIAGNOSIS — M25572 Pain in left ankle and joints of left foot: Secondary | ICD-10-CM | POA: Diagnosis not present

## 2016-08-22 DIAGNOSIS — M25562 Pain in left knee: Secondary | ICD-10-CM | POA: Diagnosis not present

## 2016-08-22 DIAGNOSIS — M5416 Radiculopathy, lumbar region: Secondary | ICD-10-CM | POA: Diagnosis not present

## 2016-09-11 DIAGNOSIS — M5416 Radiculopathy, lumbar region: Secondary | ICD-10-CM | POA: Diagnosis not present

## 2016-09-11 DIAGNOSIS — M25572 Pain in left ankle and joints of left foot: Secondary | ICD-10-CM | POA: Diagnosis not present

## 2016-09-11 DIAGNOSIS — M25562 Pain in left knee: Secondary | ICD-10-CM | POA: Diagnosis not present

## 2016-09-11 DIAGNOSIS — M25552 Pain in left hip: Secondary | ICD-10-CM | POA: Diagnosis not present

## 2016-09-16 DIAGNOSIS — M5416 Radiculopathy, lumbar region: Secondary | ICD-10-CM | POA: Diagnosis not present

## 2016-09-16 DIAGNOSIS — M25552 Pain in left hip: Secondary | ICD-10-CM | POA: Diagnosis not present

## 2016-09-16 DIAGNOSIS — M25562 Pain in left knee: Secondary | ICD-10-CM | POA: Diagnosis not present

## 2016-09-16 DIAGNOSIS — M25572 Pain in left ankle and joints of left foot: Secondary | ICD-10-CM | POA: Diagnosis not present

## 2016-09-23 DIAGNOSIS — M25562 Pain in left knee: Secondary | ICD-10-CM | POA: Diagnosis not present

## 2016-09-23 DIAGNOSIS — M5416 Radiculopathy, lumbar region: Secondary | ICD-10-CM | POA: Diagnosis not present

## 2016-09-23 DIAGNOSIS — M25572 Pain in left ankle and joints of left foot: Secondary | ICD-10-CM | POA: Diagnosis not present

## 2016-09-23 DIAGNOSIS — M25552 Pain in left hip: Secondary | ICD-10-CM | POA: Diagnosis not present

## 2016-09-25 DIAGNOSIS — M25552 Pain in left hip: Secondary | ICD-10-CM | POA: Diagnosis not present

## 2016-09-25 DIAGNOSIS — M25572 Pain in left ankle and joints of left foot: Secondary | ICD-10-CM | POA: Diagnosis not present

## 2016-09-25 DIAGNOSIS — M5416 Radiculopathy, lumbar region: Secondary | ICD-10-CM | POA: Diagnosis not present

## 2016-09-25 DIAGNOSIS — M25562 Pain in left knee: Secondary | ICD-10-CM | POA: Diagnosis not present

## 2017-01-16 DIAGNOSIS — N5201 Erectile dysfunction due to arterial insufficiency: Secondary | ICD-10-CM | POA: Diagnosis not present

## 2017-01-16 DIAGNOSIS — E291 Testicular hypofunction: Secondary | ICD-10-CM | POA: Diagnosis not present

## 2017-01-30 DIAGNOSIS — G8921 Chronic pain due to trauma: Secondary | ICD-10-CM | POA: Diagnosis not present

## 2017-01-30 DIAGNOSIS — E6609 Other obesity due to excess calories: Secondary | ICD-10-CM | POA: Diagnosis not present

## 2017-01-30 DIAGNOSIS — Z6838 Body mass index (BMI) 38.0-38.9, adult: Secondary | ICD-10-CM | POA: Diagnosis not present

## 2017-12-09 DIAGNOSIS — F988 Other specified behavioral and emotional disorders with onset usually occurring in childhood and adolescence: Secondary | ICD-10-CM | POA: Insufficient documentation

## 2019-06-02 DIAGNOSIS — Z125 Encounter for screening for malignant neoplasm of prostate: Secondary | ICD-10-CM | POA: Insufficient documentation

## 2019-06-02 DIAGNOSIS — R4586 Emotional lability: Secondary | ICD-10-CM | POA: Insufficient documentation

## 2019-06-02 DIAGNOSIS — E291 Testicular hypofunction: Secondary | ICD-10-CM | POA: Insufficient documentation

## 2020-03-28 DIAGNOSIS — R030 Elevated blood-pressure reading, without diagnosis of hypertension: Secondary | ICD-10-CM | POA: Insufficient documentation

## 2020-04-27 ENCOUNTER — Ambulatory Visit: Payer: Self-pay | Admitting: General Surgery
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
## 2020-06-06 ENCOUNTER — Encounter (HOSPITAL_COMMUNITY): Payer: Self-pay

## 2020-06-06 ENCOUNTER — Encounter (HOSPITAL_COMMUNITY)
Admission: RE | Admit: 2020-06-06 | Discharge: 2020-06-06 | Disposition: A | Discharge status: Home / Self Care | Payer: Medicare Other | Source: Ambulatory Visit | Attending: General Surgery | Admitting: General Surgery

## 2020-06-06 ENCOUNTER — Other Ambulatory Visit: Payer: Self-pay

## 2020-06-06 DIAGNOSIS — Z01818 Encounter for other preprocedural examination: Secondary | ICD-10-CM | POA: Insufficient documentation

## 2020-06-06 HISTORY — DX: Depression, unspecified: F32.A

## 2020-06-06 LAB — BASIC METABOLIC PANEL
Anion gap: 8 (ref 5–15)
BUN: 12 mg/dL (ref 6–20)
CO2: 32 mmol/L (ref 22–32)
Calcium: 9.6 mg/dL (ref 8.9–10.3)
Chloride: 98 mmol/L (ref 98–111)
Creatinine, Ser: 0.78 mg/dL (ref 0.61–1.24)
GFR, Estimated: >60 mL/min (ref >60)
Glucose, Bld: 110 mg/dL — ABNORMAL HIGH (ref 70–99)
Potassium: 3.9 mmol/L (ref 3.5–5.1)
Sodium: 138 mmol/L (ref 135–145)

## 2020-06-06 NOTE — Progress Notes (Signed)
Surgical Instructions    Your procedure is scheduled on May 17  Report to [facility] Main Entrance "A" at 0830 A.M., then check in with the Admitting office.  Call this number if you have problems the morning of surgery:  [phone]   If you have any questions prior to your surgery date call [phone]: Open Monday-Friday 8am-4pm    Remember:  Do not eat after midnight the night before your surgery  You may drink clear liquids until 0730 am the morning of your surgery.   Clear liquids allowed are: Water, Non-Citrus Juices (without pulp), Carbonated Beverages, Clear Tea, Black Coffee Only, and Gatorade  Please complete your PRE-SURGERY ENSURE that was provided to you by 0730 am the morning of surgery.  Please, if able, drink it in one setting. DO NOT SIP.     Take these medicines the morning of surgery with A SIP OF WATER  docusate sodium (COLACE) FLUoxetine (PROZAC)  methocarbamol (ROBAXIN)  Oxycodone pregabalin (LYRICA)  Stop phentermine (ADIPEX-P) if you have not already done so As of today, STOP taking any Aspirin (unless otherwise instructed by your surgeon) Aleve, Naproxen, Ibuprofen, Motrin, Advil, Goody's, BC's, all herbal medications, fish oil, and all vitamins.                     Do not wear jewelry            Do not wear lotions, powders, colognes, or deodorant.            Men may shave face and neck.            Do not bring valuables to the hospital.            [facility] is not responsible for any belongings or valuables.  Do NOT Smoke (Tobacco/Vaping) or drink Alcohol 24 hours prior to your procedure If you use a CPAP at night, you may bring all equipment for your overnight stay.   Contacts, glasses, dentures or bridgework may not be worn into surgery, please bring cases for these belongings   For patients admitted to the hospital, discharge time will be determined by your treatment team.   Patients discharged the day of surgery will not be allowed to  drive home, and someone needs to stay with them for 24 hours.    Special instructions:   [place]- Preparing For Surgery  Before surgery, you can play an important role. Because skin is not sterile, your skin needs to be as free of germs as possible. You can reduce the number of germs on your skin by washing with CHG (chlorahexidine gluconate) Soap before surgery.  CHG is an antiseptic cleaner which kills germs and bonds with the skin to continue killing germs even after washing.    Oral Hygiene is also important to reduce your risk of infection.  Remember - BRUSH YOUR TEETH THE MORNING OF SURGERY WITH YOUR REGULAR TOOTHPASTE  Please do not use if you have an allergy to CHG or antibacterial soaps. If your skin becomes reddened/irritated stop using the CHG.  Do not shave (including legs and underarms) for at least 48 hours prior to first CHG shower. It is OK to shave your face.  Please follow these instructions carefully.   1. Shower the NIGHT BEFORE SURGERY and the MORNING OF SURGERY  2. If you chose to wash your hair, wash your hair first as usual with your normal shampoo.  3. After you shampoo, rinse your hair and  body thoroughly to remove the shampoo.  4. Wash Face and genitals (private parts) with your normal soap.   5.  Shower the NIGHT BEFORE SURGERY and the MORNING OF SURGERY with CHG Soap.   6. Use CHG Soap as you would any other liquid soap. You can apply CHG directly to the skin and wash gently with a scrungie or a clean washcloth.   7. Apply the CHG Soap to your body ONLY FROM THE NECK DOWN.  Do not use on open wounds or open sores. Avoid contact with your eyes, ears, mouth and genitals (private parts). Wash Face and genitals (private parts)  with your normal soap.   8. Wash thoroughly, paying special attention to the area where your surgery will be performed.  9. Thoroughly rinse your body with warm water from the neck down.  10. DO NOT shower/wash with your normal  soap after using and rinsing off the CHG Soap.  11. Pat yourself dry with a CLEAN TOWEL.  12. Wear CLEAN PAJAMAS to bed the night before surgery  13. Place CLEAN SHEETS on your bed the night before your surgery  14. DO NOT SLEEP WITH PETS.   Day of Surgery: Take a shower with CHG soap. Wear Clean/Comfortable clothing the morning of surgery Do not apply any deodorants/lotions.   Remember to brush your teeth WITH YOUR REGULAR TOOTHPASTE.   Please read over the following fact sheets that you were given.
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
## 2020-06-08 ENCOUNTER — Other Ambulatory Visit (HOSPITAL_COMMUNITY)
Admission: RE | Admit: 2020-06-08 | Discharge: 2020-06-08 | Disposition: A | Discharge status: Home / Self Care | Payer: Medicare Other | Source: Ambulatory Visit | Attending: General Surgery | Admitting: General Surgery

## 2020-06-08 DIAGNOSIS — Z20822 Contact with and (suspected) exposure to covid-19: Secondary | ICD-10-CM | POA: Insufficient documentation

## 2020-06-08 DIAGNOSIS — Z01812 Encounter for preprocedural laboratory examination: Secondary | ICD-10-CM | POA: Diagnosis not present

## 2020-06-09 LAB — SARS CORONAVIRUS 2 (TAT 6-24 HRS): SARS Coronavirus 2: NEGATIVE
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
## 2020-06-12 ENCOUNTER — Ambulatory Visit (HOSPITAL_COMMUNITY): Payer: Medicare Other | Admitting: Anesthesiology

## 2020-06-12 ENCOUNTER — Encounter (HOSPITAL_COMMUNITY)
Admission: RE | Disposition: A | Discharge status: Home / Self Care | Payer: Self-pay | Source: Home / Self Care | Attending: General Surgery

## 2020-06-12 ENCOUNTER — Encounter (HOSPITAL_COMMUNITY): Payer: Self-pay | Admitting: General Surgery

## 2020-06-12 ENCOUNTER — Ambulatory Visit (HOSPITAL_COMMUNITY)
Admission: RE | Admit: 2020-06-12 | Discharge: 2020-06-12 | Disposition: A | Discharge status: Home / Self Care | Payer: Medicare Other | Attending: General Surgery | Admitting: General Surgery

## 2020-06-12 ENCOUNTER — Other Ambulatory Visit: Payer: Self-pay

## 2020-06-12 ENCOUNTER — Ambulatory Visit (HOSPITAL_COMMUNITY): Payer: Medicare Other | Admitting: Physician Assistant

## 2020-06-12 DIAGNOSIS — K4030 Unilateral inguinal hernia, with obstruction, without gangrene, not specified as recurrent: Secondary | ICD-10-CM | POA: Insufficient documentation

## 2020-06-12 DIAGNOSIS — Z9049 Acquired absence of other specified parts of digestive tract: Secondary | ICD-10-CM | POA: Insufficient documentation

## 2020-06-12 DIAGNOSIS — G894 Chronic pain syndrome: Secondary | ICD-10-CM | POA: Diagnosis not present

## 2020-06-12 DIAGNOSIS — Z87891 Personal history of nicotine dependence: Secondary | ICD-10-CM | POA: Diagnosis not present

## 2020-06-12 HISTORY — PX: XI ROBOTIC ASSISTED INGUINAL HERNIA REPAIR WITH MESH: SHX6706

## 2020-06-12 HISTORY — PX: INSERTION OF MESH: SHX5868

## 2020-06-12 HISTORY — DX: Cardiac arrhythmia, unspecified: I49.9

## 2020-06-12 SURGERY — REPAIR, HERNIA, INGUINAL, ROBOT-ASSISTED, LAPAROSCOPIC, USING MESH
Anesthesia: General | Site: Abdomen

## 2020-06-12 MED ORDER — FENTANYL CITRATE (PF) 100 MCG/2ML IJ SOLN
INTRAMUSCULAR | Status: AC
  Filled 2020-06-12: qty 2

## 2020-06-12 MED ORDER — KETAMINE HCL 50 MG/5ML IJ SOSY
PREFILLED_SYRINGE | INTRAMUSCULAR | Status: AC
  Filled 2020-06-12: qty 5

## 2020-06-12 MED ORDER — BUPIVACAINE-EPINEPHRINE 0.25% -1:200000 IJ SOLN
INTRAMUSCULAR | Status: DC | PRN
  Administered 2020-06-12: 7 mL

## 2020-06-12 MED ORDER — MIDAZOLAM HCL 2 MG/2ML IJ SOLN
INTRAMUSCULAR | Status: AC
  Filled 2020-06-12: qty 2

## 2020-06-12 MED ORDER — CHLORHEXIDINE GLUCONATE 0.12 % MT SOLN: 15.0000 mL | Freq: Once | OROMUCOSAL | Status: AC

## 2020-06-12 MED ORDER — CHLORHEXIDINE GLUCONATE 0.12 % MT SOLN
OROMUCOSAL | Status: AC
  Administered 2020-06-12: 15 mL via OROMUCOSAL
  Filled 2020-06-12: qty 15

## 2020-06-12 MED ORDER — MIDAZOLAM HCL 5 MG/5ML IJ SOLN
INTRAMUSCULAR | Status: DC | PRN
  Administered 2020-06-12: 2 mg via INTRAVENOUS

## 2020-06-12 MED ORDER — FENTANYL CITRATE (PF) 250 MCG/5ML IJ SOLN
INTRAMUSCULAR | Status: AC
  Filled 2020-06-12: qty 5

## 2020-06-12 MED ORDER — BUPIVACAINE-EPINEPHRINE (PF) 0.25% -1:200000 IJ SOLN
INTRAMUSCULAR | Status: AC
  Filled 2020-06-12: qty 30

## 2020-06-12 MED ORDER — ORAL CARE MOUTH RINSE: 15.0000 mL | Freq: Once | OROMUCOSAL | Status: AC

## 2020-06-12 MED ORDER — BUPIVACAINE LIPOSOME 1.3 % IJ SUSP
INTRAMUSCULAR | Status: DC | PRN
  Administered 2020-06-12: 20 mL

## 2020-06-12 MED ORDER — CHLORHEXIDINE GLUCONATE CLOTH 2 % EX PADS: 6.0000 | MEDICATED_PAD | Freq: Once | CUTANEOUS | Status: DC

## 2020-06-12 MED ORDER — KETAMINE HCL 10 MG/ML IJ SOLN
INTRAMUSCULAR | Status: DC | PRN
  Administered 2020-06-12: 20 mg via INTRAVENOUS
  Administered 2020-06-12: 10 mg via INTRAVENOUS
  Administered 2020-06-12: 20 mg via INTRAVENOUS

## 2020-06-12 MED ORDER — FENTANYL CITRATE (PF) 100 MCG/2ML IJ SOLN
INTRAMUSCULAR | Status: DC | PRN
  Administered 2020-06-12: 100 ug via INTRAVENOUS
  Administered 2020-06-12: 50 ug via INTRAVENOUS
  Administered 2020-06-12: 100 ug via INTRAVENOUS

## 2020-06-12 MED ORDER — PROPOFOL 10 MG/ML IV BOLUS
INTRAVENOUS | Status: DC | PRN
  Administered 2020-06-12: 200 mg via INTRAVENOUS

## 2020-06-12 MED ORDER — ROCURONIUM BROMIDE 100 MG/10ML IV SOLN
INTRAVENOUS | Status: DC | PRN
  Administered 2020-06-12: 60 mg via INTRAVENOUS

## 2020-06-12 MED ORDER — PHENYLEPHRINE HCL (PRESSORS) 10 MG/ML IV SOLN
INTRAVENOUS | Status: DC | PRN
  Administered 2020-06-12 (×2): 120 ug via INTRAVENOUS

## 2020-06-12 MED ORDER — FENTANYL CITRATE (PF) 100 MCG/2ML IJ SOLN
25.0000 ug | INTRAMUSCULAR | Status: DC | PRN
  Administered 2020-06-12 (×3): 50 ug via INTRAVENOUS

## 2020-06-12 MED ORDER — LACTATED RINGERS IV SOLN: INTRAVENOUS | Status: DC

## 2020-06-12 MED ORDER — LIDOCAINE HCL (CARDIAC) PF 100 MG/5ML IV SOSY
PREFILLED_SYRINGE | INTRAVENOUS | Status: DC | PRN
  Administered 2020-06-12: 80 mg via INTRAVENOUS

## 2020-06-12 MED ORDER — ACETAMINOPHEN 500 MG PO TABS
ORAL_TABLET | ORAL | Status: AC
  Administered 2020-06-12: 1000 mg via ORAL
  Filled 2020-06-12: qty 2

## 2020-06-12 MED ORDER — PHENYLEPHRINE HCL-NACL 10-0.9 MG/250ML-% IV SOLN
INTRAVENOUS | Status: AC
  Filled 2020-06-12: qty 250

## 2020-06-12 MED ORDER — ACETAMINOPHEN 500 MG PO TABS: 1000.0000 mg | ORAL_TABLET | ORAL | Status: AC

## 2020-06-12 MED ORDER — PHENYLEPHRINE HCL-NACL 10-0.9 MG/250ML-% IV SOLN
INTRAVENOUS | Status: DC | PRN
  Administered 2020-06-12: 13.333 ug/min via INTRAVENOUS

## 2020-06-12 MED ORDER — 0.9 % SODIUM CHLORIDE (POUR BTL) OPTIME
TOPICAL | Status: DC | PRN
  Administered 2020-06-12: 1000 mL

## 2020-06-12 MED ORDER — LIDOCAINE IN D5W 4-5 MG/ML-% IV SOLN
INTRAVENOUS | Status: DC | PRN
  Administered 2020-06-12: 2 ug/kg/min via INTRAVENOUS

## 2020-06-12 MED ORDER — CEFAZOLIN SODIUM-DEXTROSE 2-4 GM/100ML-% IV SOLN
2.0000 g | INTRAVENOUS | Status: AC
  Administered 2020-06-12: 2 g via INTRAVENOUS

## 2020-06-12 MED ORDER — PROPOFOL 10 MG/ML IV BOLUS
INTRAVENOUS | Status: AC
  Filled 2020-06-12: qty 40

## 2020-06-12 MED ORDER — SODIUM CHLORIDE 0.9% FLUSH
INTRAVENOUS | Status: DC | PRN
  Administered 2020-06-12 (×2): 10 mL

## 2020-06-12 MED ORDER — BUPIVACAINE LIPOSOME 1.3 % IJ SUSP
INTRAMUSCULAR | Status: AC
  Filled 2020-06-12: qty 20

## 2020-06-12 MED ORDER — ENSURE PRE-SURGERY PO LIQD: 296.0000 mL | Freq: Once | ORAL | Status: DC

## 2020-06-12 MED ORDER — PROMETHAZINE HCL 25 MG/ML IJ SOLN: 6.2500 mg | INTRAMUSCULAR | Status: DC | PRN

## 2020-06-12 MED ORDER — SUGAMMADEX SODIUM 500 MG/5ML IV SOLN
INTRAVENOUS | Status: DC | PRN
  Administered 2020-06-12: 400 mg via INTRAVENOUS

## 2020-06-12 MED ORDER — CEFAZOLIN SODIUM-DEXTROSE 2-4 GM/100ML-% IV SOLN
INTRAVENOUS | Status: AC
  Filled 2020-06-12: qty 100

## 2020-06-12 MED ORDER — DEXAMETHASONE SODIUM PHOSPHATE 10 MG/ML IJ SOLN
INTRAMUSCULAR | Status: DC | PRN
  Administered 2020-06-12: 10 mg via INTRAVENOUS

## 2020-06-12 MED ORDER — TRAMADOL HCL 50 MG PO TABS: 50.0000 mg | ORAL_TABLET | Freq: Four times a day (QID) | ORAL | 0 refills | Status: DC | PRN

## 2020-06-12 SURGICAL SUPPLY — 53 items
ADH SKN CLS APL DERMABOND .7 (GAUZE/BANDAGES/DRESSINGS) ×2
APL PRP STRL LF DISP 70% ISPRP (MISCELLANEOUS) ×2
CHLORAPREP W/TINT 26 (MISCELLANEOUS) ×3 IMPLANT
COVER MAYO STAND STRL (DRAPES) ×3 IMPLANT
COVER SURGICAL LIGHT HANDLE (MISCELLANEOUS) ×3 IMPLANT
COVER TIP SHEARS 8 DVNC (MISCELLANEOUS) ×2 IMPLANT
COVER TIP SHEARS 8MM DA VINCI (MISCELLANEOUS) ×3
DECANTER SPIKE VIAL GLASS SM (MISCELLANEOUS) ×3 IMPLANT
DEFOGGER SCOPE WARMER CLEARIFY (MISCELLANEOUS) ×3 IMPLANT
DERMABOND ADVANCED (GAUZE/BANDAGES/DRESSINGS) ×1
DERMABOND ADVANCED .7 DNX12 (GAUZE/BANDAGES/DRESSINGS) ×2 IMPLANT
DEVICE TROCAR PUNCTURE CLOSURE (ENDOMECHANICALS) ×3 IMPLANT
DRAPE ARM DVNC X/XI (DISPOSABLE) ×8 IMPLANT
DRAPE COLUMN DVNC XI (DISPOSABLE) ×2 IMPLANT
DRAPE CV SPLIT W-CLR ANES SCRN (DRAPES) ×3 IMPLANT
DRAPE DA VINCI XI ARM (DISPOSABLE) ×12
DRAPE DA VINCI XI COLUMN (DISPOSABLE) ×3
DRAPE ORTHO SPLIT 77X108 STRL (DRAPES) ×3
DRAPE SURG ORHT 6 SPLT 77X108 (DRAPES) ×2 IMPLANT
ELECT REM PT RETURN 9FT ADLT (ELECTROSURGICAL) ×3
ELECTRODE REM PT RTRN 9FT ADLT (ELECTROSURGICAL) ×2 IMPLANT
GLOVE BIO SURGEON STRL SZ7.5 (GLOVE) ×6 IMPLANT
GOWN STRL REUS W/ TWL LRG LVL3 (GOWN DISPOSABLE) ×4 IMPLANT
GOWN STRL REUS W/ TWL XL LVL3 (GOWN DISPOSABLE) ×4 IMPLANT
GOWN STRL REUS W/TWL 2XL LVL3 (GOWN DISPOSABLE) ×3 IMPLANT
GOWN STRL REUS W/TWL LRG LVL3 (GOWN DISPOSABLE) ×6
GOWN STRL REUS W/TWL XL LVL3 (GOWN DISPOSABLE) ×3
KIT BASIN OR (CUSTOM PROCEDURE TRAY) ×3 IMPLANT
KIT TURNOVER KIT B (KITS) ×3 IMPLANT
MARKER SKIN DUAL TIP RULER LAB (MISCELLANEOUS) ×3 IMPLANT
MESH PROGRIP LAP SELF FIXATING (Mesh General) ×3 IMPLANT
MESH PROGRIP LAP SLF FIX 16X12 (Mesh General) ×2 IMPLANT
NDL INSUFFLATION 14GA 120MM (NEEDLE) ×2 IMPLANT
NEEDLE HYPO 22GX1.5 SAFETY (NEEDLE) ×3 IMPLANT
NEEDLE INSUFFLATION 14GA 120MM (NEEDLE) ×3 IMPLANT
OBTURATOR OPTICAL STANDARD 8MM (TROCAR)
OBTURATOR OPTICAL STND 8 DVNC (TROCAR)
OBTURATOR OPTICALSTD 8 DVNC (TROCAR) IMPLANT
PAD ARMBOARD 7.5X6 YLW CONV (MISCELLANEOUS) ×6 IMPLANT
SEAL CANN UNIV 5-8 DVNC XI (MISCELLANEOUS) ×4 IMPLANT
SEAL XI 5MM-8MM UNIVERSAL (MISCELLANEOUS) ×9
SET IRRIG TUBING LAPAROSCOPIC (IRRIGATION / IRRIGATOR) IMPLANT
SET TUBE SMOKE EVAC HIGH FLOW (TUBING) ×3 IMPLANT
STOPCOCK 4 WAY LG BORE MALE ST (IV SETS) ×3 IMPLANT
SUT MNCRL AB 4-0 PS2 18 (SUTURE) ×3 IMPLANT
SUT VIC AB 2-0 SH 27 (SUTURE)
SUT VIC AB 2-0 SH 27X BRD (SUTURE) IMPLANT
SUT VLOC 180 2-0 6IN GS21 (SUTURE) ×3 IMPLANT
SUT VLOC 180 2-0 9IN GS21 (SUTURE) ×2 IMPLANT
SYR 30ML SLIP (SYRINGE) ×2 IMPLANT
SYR TOOMEY 50ML (SYRINGE) ×2 IMPLANT
TOWEL GREEN STERILE FF (TOWEL DISPOSABLE) ×3 IMPLANT
TRAY LAPAROSCOPIC MC (CUSTOM PROCEDURE TRAY) ×3 IMPLANT

## 2020-06-12 NOTE — Anesthesia Procedure Notes (Signed)
Procedure Name: Intubation Date/Time: 06/12/2020 10:20 AM Performed by: Jonna Munro, CRNA Pre-anesthesia Checklist: Patient identified, Emergency Drugs available, Suction available, Patient being monitored and Timeout performed Patient Re-evaluated:Patient Re-evaluated prior to induction Oxygen Delivery Method: Circle system utilized Preoxygenation: Pre-oxygenation with 100% oxygen Induction Type: IV induction Ventilation: Two handed mask ventilation required Laryngoscope Size: Mac and 4 Grade View: Grade I Tube type: Oral Tube size: 7.5 mm Number of attempts: 1 Airway Equipment and Method: Stylet Placement Confirmation: ETT inserted through vocal cords under direct vision,  positive ETCO2 and breath sounds checked- equal and bilateral Secured at: 22 cm Tube secured with: Tape Dental Injury: Teeth and Oropharynx as per pre-operative assessment

## 2020-06-12 NOTE — Transfer of Care (Signed)
Immediate Anesthesia Transfer of Care Note  Patient: [name]  Procedure(s) Performed: XI ROBOTIC ASSISTED LEFT INGUINAL HERNIA REPAIR WITH MESH (Left Abdomen) INSERTION OF MESH (N/A Abdomen)  Patient Location: PACU  Anesthesia Type:General  Level of Consciousness: awake, alert , oriented and patient cooperative  Airway & Oxygen Therapy: Patient Spontanous Breathing and Patient connected to face mask oxygen  Post-op Assessment: Report given to RN, Post -op Vital signs reviewed and stable and Patient moving all extremities X 4  Post vital signs: Reviewed and stable  Last Vitals:  Vitals Value Taken Time  BP    Temp    Pulse 65 06/12/20 1138  Resp 17 06/12/20 1138  SpO2 100 % 06/12/20 1138  Vitals shown include unvalidated device data.  Last Pain:  Vitals:   06/12/20 0854  TempSrc:   PainSc: 0-No pain         Complications: No complications documented.

## 2020-06-12 NOTE — Anesthesia Preprocedure Evaluation (Signed)
Anesthesia Evaluation  Patient identified by MRN, date of birth, ID band Patient awake    Reviewed: Allergy & Precautions, NPO status , Patient's Chart, lab work & pertinent test results  Airway Mallampati: II  TM Distance: >3 FB Neck ROM: Full    Dental  (+) Dental Advisory Given   Pulmonary former smoker,    breath sounds clear to auscultation       Cardiovascular + dysrhythmias Atrial Fibrillation  Rhythm:Regular Rate:Normal     Neuro/Psych  Neuromuscular disease    GI/Hepatic negative GI ROS, Neg liver ROS,   Endo/Other  negative endocrine ROS  Renal/GU negative Renal ROS     Musculoskeletal   Abdominal   Peds  Hematology negative hematology ROS (+)   Anesthesia Other Findings   Reproductive/Obstetrics                             Lab Results  Component Value Date   WBC 13.9 (H) 03/03/2014   HGB 13.9 03/03/2014   HCT 41.6 03/03/2014   MCV 92.2 03/03/2014   PLT 260 03/03/2014   Lab Results  Component Value Date   CREATININE 0.78 06/06/2020   BUN 12 06/06/2020   NA 138 06/06/2020   K 3.9 06/06/2020   CL 98 06/06/2020   CO2 32 06/06/2020    Anesthesia Physical Anesthesia Plan  ASA: II  Anesthesia Plan: General   Post-op Pain Management:    Induction: Intravenous  PONV Risk Score and Plan: 3 and Dexamethasone, Ondansetron, Midazolam and Treatment may vary due to age or medical condition  Airway Management Planned: Oral ETT  Additional Equipment: None  Intra-op Plan:   Post-operative Plan: Extubation in OR  Informed Consent: I have reviewed the patients History and Physical, chart, labs and discussed the procedure including the risks, benefits and alternatives for the proposed anesthesia with the patient or authorized representative who has indicated his/her understanding and acceptance.     Dental advisory given  Plan Discussed with: CRNA  Anesthesia Plan  Comments:         Anesthesia Quick Evaluation

## 2020-06-12 NOTE — Discharge Instructions (Signed)
[facility] _______[facility] °INGUINAL HERNIA REPAIR: POST OP INSTRUCTIONS ° °Always review your discharge instruction sheet given to you by the facility where your surgery was performed. °IF YOU HAVE DISABILITY OR FAMILY LEAVE FORMS, YOU MUST BRING THEM TO THE OFFICE FOR PROCESSING.   °DO NOT GIVE THEM TO YOUR DOCTOR. ° °1. A  prescription for pain medication may be given to you upon discharge.  Take your pain medication as prescribed, if needed.  If narcotic pain medicine is not needed, then you may take acetaminophen (Tylenol) or ibuprofen (Advil) as needed. °2. Take your usually prescribed medications unless otherwise directed. °If you need a refill on your pain medication, please contact your pharmacy.  They will contact our office to request authorization. Prescriptions will not be filled after 5 pm or on week-ends. °3. You should follow a light diet the first 24 hours after arrival home, such as soup and crackers, etc.  Be sure to include lots of fluids daily.  Resume your normal diet the day after surgery. °4.Most patients will experience some swelling and bruising around the umbilicus or in the groin and scrotum.  Ice packs and reclining will help.  Swelling and bruising can take several days to resolve.  °6. It is common to experience some constipation if taking pain medication after surgery.  Increasing fluid intake and taking a stool softener (such as Colace) will usually help or prevent this problem from occurring.  A mild laxative (Milk of Magnesia or Miralax) should be taken according to package directions if there are no bowel movements after 48 hours. °7. Unless discharge instructions indicate otherwise, you may remove your bandages 24-48 hours after surgery, and you may shower at that time.  You may have steri-strips (small skin tapes) in place directly over the incision.  These strips should be left on the skin for 7-10 days.  If your surgeon used skin glue on the incision, you may  shower in 24 hours.  The glue will flake off over the next 2-3 weeks.  Any sutures or staples will be removed at the office during your follow-up visit. °8. ACTIVITIES:  You may resume regular (light) daily activities beginning the next day--such as daily self-care, walking, climbing stairs--gradually increasing activities as tolerated.  You may have sexual intercourse when it is comfortable.  Refrain from any heavy lifting or straining until approved by your doctor. ° °a.You may drive when you are no longer taking prescription pain medication, you can comfortably wear a seatbelt, and you can safely maneuver your car and apply brakes. °b.RETURN TO WORK:   °_____________________________________________ ° °9.You should see your doctor in the office for a follow-up appointment approximately 2-3 weeks after your surgery.  Make sure that you call for this appointment within a day or two after you arrive home to insure a convenient appointment time. °10.OTHER INSTRUCTIONS: _________________________ °   _____________________________________ ° °WHEN TO CALL YOUR DOCTOR: °1. Fever over 101.0 °2. Inability to urinate °3. Nausea and/or vomiting °4. Extreme swelling or bruising °5. Continued bleeding from incision. °6. Increased pain, redness, or drainage from the incision ° °The clinic staff is available to answer your questions during regular business hours.  Please don’t hesitate to call and ask to speak to one of the nurses for clinical concerns.  If you have a medical emergency, go to the nearest emergency room or call 911.  A surgeon from [facility] is always on call at the hospital ° ° °[address], [address], [place], [place]  [address] ? ° [address] [address], [place],    [address] °[phone] ? [phone] ? FAX [phone] °Web site: [email or web address] ° °

## 2020-06-12 NOTE — Interval H&P Note (Signed)
History and Physical Interval Note:  06/12/2020 9:31 AM  [name]  has presented today for surgery, with the diagnosis of LEFT INGUINAL HERNIA.  The various methods of treatment have been discussed with the patient and family. After consideration of risks, benefits and other options for treatment, the patient has consented to  Procedure(s): XI ROBOTIC ASSISTED LEFT INGUINAL HERNIA REPAIR WITH MESH (Left) as a surgical intervention.  The patient's history has been reviewed, patient examined, no change in status, stable for surgery.  I have reviewed the patient's chart and labs.  Questions were answered to the patient's satisfaction.     Axel Filler

## 2020-06-12 NOTE — Op Note (Signed)
06/12/2020  11:11 AM  PATIENT:  [name]  60 y.o. male  PRE-OPERATIVE DIAGNOSIS:  LEFT INGUINAL HERNIA  POST-OPERATIVE DIAGNOSIS:  INCARCERATED LEFT INDIRECT INGUINAL HERNIA  PROCEDURE:  Procedure(s): XI ROBOTIC ASSISTED LEFT INGUINAL HERNIA REPAIR WITH MESH (Left) INSERTION OF MESH (N/A)  SURGEON:  Surgeon(s) and Role:    * Axel Filler, MD - Primary  ASSISTANTS: Berenda Morale, RNFA   ANESTHESIA:   local, regional and general  EBL:  minimal   BLOOD ADMINISTERED:none  DRAINS: none   LOCAL MEDICATIONS USED:  BUPIVICAINE  and OTHER exparel  SPECIMEN:  No Specimen  DISPOSITION OF SPECIMEN:  none  COUNTS:  YES  TOURNIQUET:  * No tourniquets in log *  DICTATION: .Dragon Dictation  Findings: Large incarcerated sliding indirect inguinal hernia.  Details of the procedure:   The patient was taken back to the operating room. The patient was placed in supine position with bilateral SCDs in place.  The patient was prepped and draped in the usual sterile fashion.  After appropriate anitbiotics were confirmed, a time-out was confirmed and all facts were verified.  At this time a Veress needle technique was used to inspect the abdomen approximately 10 cm from the valgus and the paramedian line. This time a 8 mm robotic trocar was placed into the abdomen. The camera was placed there was no injury to any intra-abdominal organs. A 29mm umbilical port was placed just superior to the umbilicus. An 8 mm port was placed approximately 10 cm lateral to the umbilicus on the left paramedian side.   Robot was positioned over the patient and the ports were docked in the usual fashion.  There was an evident L sliding incarcerated L indirect hernia seen with sigmoid colon.  This was reduced with gentle traction.  At this time the Left-sided peritoneum was taken down from the medial umbilical ligament laterally. The pre-peritoneal space was entered. Dissection was taken down to Cooper's  ligament. At this time it was apparent there was a large indirect hernia.  At this time proceeded clean out the rest Cooper's ligament and the medial to lateral direction. A proceeded laterally to dissect the spermatic cord. The spermatic cord was circumferentially dissected away from the surrounding musculature and tissue. The vas deferens was identified and protected all portions of the case. The peritoneum was dissected away from the cord in its entirety. This was dissected back. At this time I proceeded to create a pocket laterally for the mesh. Once the pocket was created the peritoneum was stripped back to approximately the base of the cord. At this time the piece of 12x16cm Left progrip mesh was in placed into the dissected area. This covered both the direct and indirect spaces appropriately. This also covered  The femoral space. The mesh lay flat from medial to lateral. At this time a 2 V- lock stitch was used to close the peritoneum in a standard running fashion.  At this time the robot was undocked. The umbilical port site was reapproximated using a 0 Vicryl via an Endo Close device 1. All ports were removed. The skin was reapproximated and all port sites using 4-0 Monocryl subcuticular fashion.  The patient the procedure well was taken to the recovery     PLAN OF CARE: Discharge to home after PACU  PATIENT DISPOSITION:  PACU - hemodynamically stable.   Delay start of Pharmacological VTE agent (>24hrs) due to surgical blood loss or risk of bleeding: not applicable

## 2020-06-13 ENCOUNTER — Encounter (HOSPITAL_COMMUNITY): Payer: Self-pay | Admitting: General Surgery

## 2020-06-13 NOTE — Anesthesia Postprocedure Evaluation (Signed)
Anesthesia Post Note  Patient: [name]  Procedure(s) Performed: XI ROBOTIC ASSISTED LEFT INGUINAL HERNIA REPAIR WITH MESH (Left Abdomen) INSERTION OF MESH (N/A Abdomen)     Patient location during evaluation: PACU Anesthesia Type: General Level of consciousness: awake and alert Pain management: pain level controlled Vital Signs Assessment: post-procedure vital signs reviewed and stable Respiratory status: spontaneous breathing, nonlabored ventilation, respiratory function stable and patient connected to nasal cannula oxygen Cardiovascular status: blood pressure returned to baseline and stable Postop Assessment: no apparent nausea or vomiting Anesthetic complications: no   No complications documented.  Last Vitals:  Vitals:   06/12/20 1245 06/12/20 1248  BP: 117/72 111/80  Pulse: (!) 57 62  Resp: 16 11  Temp: 36.6 C   SpO2: 96% 100%    Last Pain:  Vitals:   06/12/20 1245  TempSrc:   PainSc: 0-No pain                 Kennieth Rad

## 2020-12-06 DIAGNOSIS — E559 Vitamin D deficiency, unspecified: Secondary | ICD-10-CM | POA: Insufficient documentation

## 2021-02-12 ENCOUNTER — Telehealth: Payer: Self-pay | Admitting: Physical Medicine and Rehabilitation

## 2021-02-12 ENCOUNTER — Other Ambulatory Visit: Payer: Self-pay | Admitting: Physical Medicine and Rehabilitation

## 2021-02-12 NOTE — Telephone Encounter (Signed)
Pt called stating he would like a CB to set an appt.  [phone]

## 2021-02-19 ENCOUNTER — Ambulatory Visit (INDEPENDENT_AMBULATORY_CARE_PROVIDER_SITE_OTHER): Payer: Medicare Other | Admitting: Physical Medicine and Rehabilitation

## 2021-02-19 ENCOUNTER — Ambulatory Visit (INDEPENDENT_AMBULATORY_CARE_PROVIDER_SITE_OTHER): Payer: Medicare Other

## 2021-02-19 ENCOUNTER — Other Ambulatory Visit: Payer: Self-pay

## 2021-02-19 ENCOUNTER — Encounter: Payer: Self-pay | Admitting: Physical Medicine and Rehabilitation

## 2021-02-19 VITALS — BP 169/99 | HR 93

## 2021-02-19 DIAGNOSIS — M5416 Radiculopathy, lumbar region: Secondary | ICD-10-CM

## 2021-02-19 DIAGNOSIS — M4726 Other spondylosis with radiculopathy, lumbar region: Secondary | ICD-10-CM

## 2021-02-19 DIAGNOSIS — M25552 Pain in left hip: Secondary | ICD-10-CM

## 2021-02-19 DIAGNOSIS — G8921 Chronic pain due to trauma: Secondary | ICD-10-CM | POA: Diagnosis not present

## 2021-02-19 DIAGNOSIS — Z96642 Presence of left artificial hip joint: Secondary | ICD-10-CM
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
## 2021-02-19 NOTE — Progress Notes (Signed)
Pt state lower back pain that travels to his left hip, ankle and foot. Pt state walking, bending and laying down makes the pain worse. Pt state he takes pain meds and uses tens unit to help ease his pain.  Numeric Pain Rating Scale and Functional Assessment Average Pain 10 Pain Right Now 3 My pain is constant, sharp, burning, dull, tingling, and aching Pain is worse with: walking, bending, standing, some activites, and Laying down Pain improves with: heat/ice, medication, and TENS   In the last MONTH (on 0-10 scale) has pain interfered with the following?  1. General activity like being  able to carry out your everyday physical activities such as walking, climbing stairs, carrying groceries, or moving a chair?  Rating(8)  2. Relation with others like being able to carry out your usual social activities and roles such as  activities at home, at work and in your community. Rating(9)  3. Enjoyment of life such that you have  been bothered by emotional problems such as feeling anxious, depressed or irritable?  Rating(9)

## 2021-03-05 ENCOUNTER — Other Ambulatory Visit: Payer: Self-pay

## 2021-03-05 ENCOUNTER — Ambulatory Visit
Admission: RE | Admit: 2021-03-05 | Discharge: 2021-03-05 | Disposition: A | Discharge status: Home / Self Care | Payer: Medicare Other | Source: Ambulatory Visit | Attending: Physical Medicine and Rehabilitation | Admitting: Physical Medicine and Rehabilitation

## 2021-04-02 ENCOUNTER — Encounter: Payer: Self-pay | Admitting: Physical Medicine and Rehabilitation

## 2021-04-02 ENCOUNTER — Other Ambulatory Visit: Payer: Self-pay

## 2021-04-02 ENCOUNTER — Ambulatory Visit (INDEPENDENT_AMBULATORY_CARE_PROVIDER_SITE_OTHER): Payer: Medicare Other | Admitting: Physical Medicine and Rehabilitation

## 2021-04-02 VITALS — BP 157/103 | HR 75

## 2021-04-02 DIAGNOSIS — M4726 Other spondylosis with radiculopathy, lumbar region: Secondary | ICD-10-CM | POA: Diagnosis not present

## 2021-04-02 DIAGNOSIS — M5416 Radiculopathy, lumbar region: Secondary | ICD-10-CM | POA: Diagnosis not present

## 2021-04-02 DIAGNOSIS — G8921 Chronic pain due to trauma: Secondary | ICD-10-CM | POA: Diagnosis not present

## 2021-04-02 DIAGNOSIS — M47816 Spondylosis without myelopathy or radiculopathy, lumbar region: Secondary | ICD-10-CM | POA: Diagnosis not present
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
## 2021-04-02 NOTE — Progress Notes (Signed)
Pt state lower back pain that travels to both legs, mostly his right leg. Pt state walking and bending makes the pain worse. Pt state he takes pain meds to help ease his pain. ? ?Numeric Pain Rating Scale and Functional Assessment ?Average Pain 10 ?Pain Right Now 3 ?My pain is constant, sharp, burning, stabbing, and aching ?Pain is worse with: walking and bending ?Pain improves with: medication ? ? ?In the last MONTH (on 0-10 scale) has pain interfered with the following? ? ?1. General activity like being  able to carry out your everyday physical activities such as walking, climbing stairs, carrying groceries, or moving a chair?  ?Rating(7) ? ?2. Relation with others like being able to carry out your usual social activities and roles such as  activities at home, at work and in your community. ?Rating(6) ? ?3. Enjoyment of life such that you have  been bothered by emotional problems such as feeling anxious, depressed or irritable?  ?Rating(5) ? ?

## 2021-04-18 ENCOUNTER — Other Ambulatory Visit: Payer: Self-pay

## 2021-04-18 ENCOUNTER — Ambulatory Visit: Payer: Medicare Other

## 2021-04-18 ENCOUNTER — Ambulatory Visit (INDEPENDENT_AMBULATORY_CARE_PROVIDER_SITE_OTHER): Payer: Medicare Other | Admitting: Physical Medicine and Rehabilitation

## 2021-04-18 ENCOUNTER — Encounter: Payer: Self-pay | Admitting: Physical Medicine and Rehabilitation

## 2021-04-18 VITALS — BP 134/88 | HR 81

## 2021-04-18 DIAGNOSIS — M5416 Radiculopathy, lumbar region: Secondary | ICD-10-CM | POA: Diagnosis not present

## 2021-04-18 MED ORDER — METHYLPREDNISOLONE ACETATE 80 MG/ML IJ SUSP
80.0000 mg | Freq: Once | INTRAMUSCULAR | Status: AC
  Administered 2021-04-18: 80 mg

## 2021-04-18 NOTE — Patient Instructions (Signed)

## 2021-04-18 NOTE — Progress Notes (Signed)
Pt state lower back pain that travels to both legs, mostly his right leg. Pt state walking and bending makes the pain worse. Pt state he takes pain meds to help ease his pain. ? ?Numeric Pain Rating Scale and Functional Assessment ?Average Pain 7 ? ? ?In the last MONTH (on 0-10 scale) has pain interfered with the following? ? ?1. General activity like being  able to carry out your everyday physical activities such as walking, climbing stairs, carrying groceries, or moving a chair?  ?Rating(10) ? ? ?+Driver, -BT, -Dye Allergies. ? ?

## 2021-04-28 NOTE — Procedures (Signed)
Lumbar Epidural Steroid Injection - Interlaminar Approach with Fluoroscopic Guidance ? ?Patient: [name]      ?Date of Birth: 02-07-1960 ?MRN: [id number] ?PCP: Barbie Banner, MD      ?Visit Date: 04/18/2021 ?  ?Universal Protocol:    ? ?Consent Given By: the patient ? ?Position: PRONE ? ?Additional Comments: ?Vital signs were monitored before and after the procedure. ?Patient was prepped and draped in the usual sterile fashion. ?The correct patient, procedure, and site was verified. ? ? ?Injection Procedure Details:  ? ?Procedure diagnoses: Lumbar radiculopathy [M54.16]  ? ?Meds Administered:  ?Meds ordered this encounter  ?Medications  ? methylPREDNISolone acetate (DEPO-MEDROL) injection 80 mg  ?  ? ?Laterality: Left ? ?Location/Site:  L4-5 ? ?Needle: 3.5 in., 20 ga. Tuohy ? ?Needle Placement: Paramedian epidural ? ?Findings:  ? -Comments: Excellent flow of contrast into the epidural space. ? ?Procedure Details: ?Using a paramedian approach from the side mentioned above, the region overlying the inferior lamina was localized under fluoroscopic visualization and the soft tissues overlying this structure were infiltrated with 4 ml. of 1% Lidocaine without Epinephrine. The Tuohy needle was inserted into the epidural space using a paramedian approach.  ? ?The epidural space was localized using loss of resistance along with counter oblique bi-planar fluoroscopic views.  After negative aspirate for air, blood, and CSF, a 2 ml. volume of Isovue-250 was injected into the epidural space and the flow of contrast was observed. Radiographs were obtained for documentation purposes.   ? ?The injectate was administered into the level noted above. ? ? ?Additional Comments:  ?The patient tolerated the procedure well ?Dressing: 2 x 2 sterile gauze and Band-Aid ?  ? ?Post-procedure details: ?Patient was observed during the procedure. ?Post-procedure instructions were reviewed. ? ?Patient left the clinic in stable condition. ?

## 2021-04-28 NOTE — Progress Notes (Signed)
? ?[name] - 61 y.o. male MRN [id number]  Date of birth: 13-Nov-1960 ? ?Office Visit Note: ?Visit Date: 04/18/2021 ?PCP: Bradley Sacramento, MD ?Referred by: Bradley Sacramento, MD ? ?Subjective: ?Chief Complaint  ?Patient presents with  ? Lower Back - Pain  ? Right Leg - Pain  ? Left Leg - Pain  ? ?HPI:  [name] is a 61 y.o. male who comes in today at the request of Bradley Pall, FNP for planned Left L4-5 Lumbar Interlaminar epidural steroid injection with fluoroscopic guidance.  The patient has failed conservative care including home exercise, medications, time and activity modification.  This injection will be diagnostic and hopefully therapeutic.  Please see requesting physician notes for further details and justification. ? ?ROS Otherwise per HPI. ? ?Assessment & Plan: ?Visit Diagnoses:  ?  ICD-10-CM   ?1. Lumbar radiculopathy  M54.16 XR C-ARM NO REPORT  ?  Epidural Steroid injection  ?  methylPREDNISolone acetate (DEPO-MEDROL) injection 80 mg  ?  ?  ?Plan: No additional findings.  ? ?Meds & Orders:  ?Meds ordered this encounter  ?Medications  ? methylPREDNISolone acetate (DEPO-MEDROL) injection 80 mg  ?  ?Orders Placed This Encounter  ?Procedures  ? XR C-ARM NO REPORT  ? Epidural Steroid injection  ?  ?Follow-up: Return for visit to requesting provider as needed.  ? ?Procedures: ?No procedures performed  ?Lumbar Epidural Steroid Injection - Interlaminar Approach with Fluoroscopic Guidance ? ?Patient: [name]      ?Date of Birth: [birth year] ?MRN: [id number] ?PCP: Bradley Sacramento, MD      ?Visit Date: 04/18/2021 ?  ?Universal Protocol:    ? ?Consent Given By: the patient ? ?Position: PRONE ? ?Additional Comments: ?Vital signs were monitored before and after the procedure. ?Patient was prepped and draped in the usual sterile fashion. ?The correct patient, procedure, and site was verified. ? ? ?Injection Procedure Details:  ? ?Procedure diagnoses: Lumbar radiculopathy [M54.16]  ? ?Meds Administered:   ?Meds ordered this encounter  ?Medications  ? methylPREDNISolone acetate (DEPO-MEDROL) injection 80 mg  ?  ? ?Laterality: Left ? ?Location/Site:  L4-5 ? ?Needle: 3.5 in., 20 ga. Tuohy ? ?Needle Placement: Paramedian epidural ? ?Findings:  ? -Comments: Excellent flow of contrast into the epidural space. ? ?Procedure Details: ?Using a paramedian approach from the side mentioned above, the region overlying the inferior lamina was localized under fluoroscopic visualization and the soft tissues overlying this structure were infiltrated with 4 ml. of 1% Lidocaine without Epinephrine. The Tuohy needle was inserted into the epidural space using a paramedian approach.  ? ?The epidural space was localized using loss of resistance along with counter oblique bi-planar fluoroscopic views.  After negative aspirate for air, blood, and CSF, a 2 ml. volume of Isovue-250 was injected into the epidural space and the flow of contrast was observed. Radiographs were obtained for documentation purposes.   ? ?The injectate was administered into the level noted above. ? ? ?Additional Comments:  ?The patient tolerated the procedure well ?Dressing: 2 x 2 sterile gauze and Band-Aid ?  ? ?Post-procedure details: ?Patient was observed during the procedure. ?Post-procedure instructions were reviewed. ? ?Patient left the clinic in stable condition.  ? ?Clinical History: ?MRI LUMBAR SPINE WITHOUT CONTRAST  ?   ?TECHNIQUE:  ?Multiplanar, multisequence MR imaging of the lumbar spine was  ?performed. No intravenous contrast was administered.  ?   ?COMPARISON:  Lumbar spine radiographs 02/19/2021  ?   ?FINDINGS:  ?Segmentation:  Standard.  ?   ?  Alignment:  Normal.  ?   ?Vertebrae: No fracture or suspicious marrow lesion. Prominent  ?bilateral facet edema at L4-5.  ?   ?Conus medullaris and cauda equina: Conus extends to the T12 level.  ?Conus and cauda equina appear normal.  ?   ?Paraspinal and other soft tissues: Unremarkable.  ?   ?Disc levels:  ?    ?T12-L1 and L1-2: Negative.  ?   ?L2-3: Mild disc desiccation. Mild disc bulging and mild facet  ?hypertrophy without significant stenosis.  ?   ?L3-4: Mild disc desiccation. Moderate facet hypertrophy and at most  ?minimal disc bulging without stenosis.  ?   ?L4-5: Mild disc desiccation. Disc bulging eccentric to the right and  ?severe facet and ligamentum flavum hypertrophy result in mild spinal  ?stenosis, mild-to-moderate bilateral lateral recess stenosis, and  ?mild-to-moderate right neural foraminal stenosis. The L5 nerve roots  ?could be affected in the lateral recesses.  ?   ?L5-S1: Normal disc.  Moderate facet hypertrophy without stenosis.  ?   ?IMPRESSION:  ?1. Severe L4-5 facet arthrosis with edema which may be a source of  ?back pain.  ?2. Mild-to-moderate lateral recess and right neural foraminal  ?stenosis at L4-5.  ?   ?   ?Electronically Signed  ?  By: Logan Bores M.D.  ?  On: 03/05/2021 15:15  ? ? ? ?Objective:  VS:  HT:    WT:   BMI:     BP:134/88  HR:81bpm  TEMP: ( )  RESP:  ?Physical Exam ?Vitals and nursing note reviewed.  ?Constitutional:   ?   General: He is not in acute distress. ?   Appearance: Normal appearance. He is not ill-appearing.  ?HENT:  ?   Head: Normocephalic and atraumatic.  ?   Right Ear: External ear normal.  ?   Left Ear: External ear normal.  ?   Nose: No congestion.  ?Eyes:  ?   Extraocular Movements: Extraocular movements intact.  ?Cardiovascular:  ?   Rate and Rhythm: Normal rate.  ?   Pulses: Normal pulses.  ?Pulmonary:  ?   Effort: Pulmonary effort is normal. No respiratory distress.  ?Abdominal:  ?   General: There is no distension.  ?   Palpations: Abdomen is soft.  ?Musculoskeletal:     ?   General: No tenderness or signs of injury.  ?   Cervical back: Neck supple.  ?   Right lower leg: No edema.  ?   Left lower leg: No edema.  ?   Comments: Patient has good distal strength without clonus.  ?Skin: ?   Findings: No erythema or rash.  ?Neurological:  ?    General: No focal deficit present.  ?   Mental Status: He is alert and oriented to person, place, and time.  ?   Sensory: No sensory deficit.  ?   Motor: No weakness or abnormal muscle tone.  ?   Coordination: Coordination normal.  ?Psychiatric:     ?   Mood and Affect: Mood normal.     ?   Behavior: Behavior normal.  ?  ? ?Imaging: ?No results found. ?

## 2021-05-14 ENCOUNTER — Encounter: Payer: Self-pay | Admitting: Physical Medicine and Rehabilitation

## 2021-07-24 ENCOUNTER — Encounter: Payer: Self-pay | Admitting: Physical Medicine and Rehabilitation

## 2021-07-24 ENCOUNTER — Ambulatory Visit (INDEPENDENT_AMBULATORY_CARE_PROVIDER_SITE_OTHER): Payer: Medicare Other | Admitting: Physical Medicine and Rehabilitation

## 2021-07-24 ENCOUNTER — Ambulatory Visit: Payer: Medicare Other

## 2021-07-24 VITALS — BP 134/88 | HR 76

## 2021-07-24 DIAGNOSIS — M5416 Radiculopathy, lumbar region: Secondary | ICD-10-CM

## 2021-07-24 MED ORDER — METHYLPREDNISOLONE ACETATE 80 MG/ML IJ SUSP
80.0000 mg | Freq: Once | INTRAMUSCULAR | Status: AC
  Administered 2021-07-24: 80 mg

## 2021-07-24 NOTE — Progress Notes (Signed)
Pt state lower back pain that travels to right legs. Pt state walking and bending makes the pain worse. Pt state he takes pain meds to help ease his pain.  Numeric Pain Rating Scale and Functional Assessment Average Pain 8   In the last MONTH (on 0-10 scale) has pain interfered with the following?  1. General activity like being  able to carry out your everyday physical activities such as walking, climbing stairs, carrying groceries, or moving a chair?  Rating(10)   +Driver, -BT, -Dye Allergies.

## 2021-07-24 NOTE — Patient Instructions (Signed)

## 2021-08-20 NOTE — Procedures (Signed)
Lumbar Epidural Steroid Injection - Interlaminar Approach with Fluoroscopic Guidance  Patient: [name]      Date of Birth: 14-Oct-1960 MRN: [id number] PCP: Barbie Banner, MD      Visit Date: 07/24/2021   Universal Protocol:     Consent Given By: the patient  Position: PRONE  Additional Comments: Vital signs were monitored before and after the procedure. Patient was prepped and draped in the usual sterile fashion. The correct patient, procedure, and site was verified.   Injection Procedure Details:   Procedure diagnoses: Lumbar radiculopathy [M54.16]   Meds Administered:  Meds ordered this encounter  Medications   methylPREDNISolone acetate (DEPO-MEDROL) injection 80 mg     Laterality: Right  Location/Site:  L4-5  Needle: 4.5 in., 20 ga. Tuohy  Needle Placement: Paramedian epidural  Findings:   -Comments: Excellent flow of contrast into the epidural space.  Procedure Details: Using a paramedian approach from the side mentioned above, the region overlying the inferior lamina was localized under fluoroscopic visualization and the soft tissues overlying this structure were infiltrated with 4 ml. of 1% Lidocaine without Epinephrine. The Tuohy needle was inserted into the epidural space using a paramedian approach.   The epidural space was localized using loss of resistance along with counter oblique bi-planar fluoroscopic views.  After negative aspirate for air, blood, and CSF, a 2 ml. volume of Isovue-250 was injected into the epidural space and the flow of contrast was observed. Radiographs were obtained for documentation purposes.    The injectate was administered into the level noted above.   Additional Comments:  The patient tolerated the procedure well Dressing: 2 x 2 sterile gauze and Band-Aid    Post-procedure details: Patient was observed during the procedure. Post-procedure instructions were reviewed.  Patient left the clinic in stable condition.

## 2021-08-20 NOTE — Progress Notes (Signed)
[name] - 61 y.o. male MRN [id number]  Date of birth: Jun 26, 1960  Office Visit Note: Visit Date: 07/24/2021 PCP: Barbie Banner, MD Referred by: Tyrell Antonio, MD  Subjective: Chief Complaint  Patient presents with   Lower Back - Pain   Right Leg - Pain   HPI:  [name] is a 61 y.o. male who comes in today for planned repeat Right L4-5  Lumbar Interlaminar epidural steroid injection with fluoroscopic guidance.  The patient has failed conservative care including home exercise, medications, time and activity modification.  This injection will be diagnostic and hopefully therapeutic.  Please see requesting physician notes for further details and justification. Patient received more than 50% pain relief from prior injection.   Referring: Ellin Goodie, FNP   ROS Otherwise per HPI.  Assessment & Plan: Visit Diagnoses:    ICD-10-CM   1. Lumbar radiculopathy  M54.16 XR C-ARM NO REPORT    Epidural Steroid injection    methylPREDNISolone acetate (DEPO-MEDROL) injection 80 mg      Plan: No additional findings.   Meds & Orders:  Meds ordered this encounter  Medications   methylPREDNISolone acetate (DEPO-MEDROL) injection 80 mg    Orders Placed This Encounter  Procedures   XR C-ARM NO REPORT   Epidural Steroid injection    Follow-up: Return if symptoms worsen or fail to improve.   Procedures: No procedures performed  Lumbar Epidural Steroid Injection - Interlaminar Approach with Fluoroscopic Guidance  Patient: [name]      Date of Birth: [birth year] MRN: [id number] PCP: Barbie Banner, MD      Visit Date: 07/24/2021   Universal Protocol:     Consent Given By: the patient  Position: PRONE  Additional Comments: Vital signs were monitored before and after the procedure. Patient was prepped and draped in the usual sterile fashion. The correct patient, procedure, and site was verified.   Injection Procedure Details:   Procedure diagnoses:  Lumbar radiculopathy [M54.16]   Meds Administered:  Meds ordered this encounter  Medications   methylPREDNISolone acetate (DEPO-MEDROL) injection 80 mg     Laterality: Right  Location/Site:  L4-5  Needle: 4.5 in., 20 ga. Tuohy  Needle Placement: Paramedian epidural  Findings:   -Comments: Excellent flow of contrast into the epidural space.  Procedure Details: Using a paramedian approach from the side mentioned above, the region overlying the inferior lamina was localized under fluoroscopic visualization and the soft tissues overlying this structure were infiltrated with 4 ml. of 1% Lidocaine without Epinephrine. The Tuohy needle was inserted into the epidural space using a paramedian approach.   The epidural space was localized using loss of resistance along with counter oblique bi-planar fluoroscopic views.  After negative aspirate for air, blood, and CSF, a 2 ml. volume of Isovue-250 was injected into the epidural space and the flow of contrast was observed. Radiographs were obtained for documentation purposes.    The injectate was administered into the level noted above.   Additional Comments:  The patient tolerated the procedure well Dressing: 2 x 2 sterile gauze and Band-Aid    Post-procedure details: Patient was observed during the procedure. Post-procedure instructions were reviewed.  Patient left the clinic in stable condition.   Clinical History: MRI LUMBAR SPINE WITHOUT CONTRAST     TECHNIQUE:  Multiplanar, multisequence MR imaging of the lumbar spine was  performed. No intravenous contrast was administered.     COMPARISON:  Lumbar spine radiographs 02/19/2021     FINDINGS:  Segmentation:  Standard.     Alignment:  Normal.     Vertebrae: No fracture or suspicious marrow lesion. Prominent  bilateral facet edema at L4-5.     Conus medullaris and cauda equina: Conus extends to the T12 level.  Conus and cauda equina appear normal.     Paraspinal and  other soft tissues: Unremarkable.     Disc levels:     T12-L1 and L1-2: Negative.     L2-3: Mild disc desiccation. Mild disc bulging and mild facet  hypertrophy without significant stenosis.     L3-4: Mild disc desiccation. Moderate facet hypertrophy and at most  minimal disc bulging without stenosis.     L4-5: Mild disc desiccation. Disc bulging eccentric to the right and  severe facet and ligamentum flavum hypertrophy result in mild spinal  stenosis, mild-to-moderate bilateral lateral recess stenosis, and  mild-to-moderate right neural foraminal stenosis. The L5 nerve roots  could be affected in the lateral recesses.     L5-S1: Normal disc.  Moderate facet hypertrophy without stenosis.     IMPRESSION:  1. 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  2. Mild-to-moderate lateral recess and right neural foraminal  stenosis at L4-5.        Electronically Signed    By: Sebastian Ache M.D.    On: 03/05/2021 15:15     Objective:  VS:  HT:    WT:   BMI:     BP:134/88  HR:76bpm  TEMP: ( )  RESP:  Physical Exam Vitals and nursing note reviewed.  Constitutional:      General: He is not in acute distress.    Appearance: Normal appearance. He is not ill-appearing.  HENT:     Head: Normocephalic and atraumatic.     Right Ear: External ear normal.     Left Ear: External ear normal.     Nose: No congestion.  Eyes:     Extraocular Movements: Extraocular movements intact.  Cardiovascular:     Rate and Rhythm: Normal rate.     Pulses: Normal pulses.  Pulmonary:     Effort: Pulmonary effort is normal. No respiratory distress.  Abdominal:     General: There is no distension.     Palpations: Abdomen is soft.  Musculoskeletal:        General: No tenderness or signs of injury.     Cervical back: Neck supple.     Right lower leg: No edema.     Left lower leg: No edema.     Comments: Patient has good distal strength without clonus.  Skin:     Findings: No erythema or rash.  Neurological:     General: No focal deficit present.     Mental Status: He is alert and oriented to person, place, and time.     Sensory: No sensory deficit.     Motor: No weakness or abnormal muscle tone.     Coordination: Coordination normal.  Psychiatric:        Mood and Affect: Mood normal.        Behavior: Behavior normal.      Imaging: No results found.

## 2021-11-25 ENCOUNTER — Ambulatory Visit: Payer: Medicare Other

## 2021-11-25 ENCOUNTER — Ambulatory Visit (INDEPENDENT_AMBULATORY_CARE_PROVIDER_SITE_OTHER): Payer: Medicare Other | Admitting: Physical Medicine and Rehabilitation

## 2021-11-25 VITALS — BP 137/89 | HR 64

## 2021-11-25 DIAGNOSIS — M5416 Radiculopathy, lumbar region: Secondary | ICD-10-CM | POA: Diagnosis not present

## 2021-11-25 MED ORDER — METHYLPREDNISOLONE ACETATE 80 MG/ML IJ SUSP
40.0000 mg | Freq: Once | INTRAMUSCULAR | Status: AC
  Administered 2021-11-25: 40 mg

## 2021-11-25 NOTE — Patient Instructions (Signed)

## 2021-11-25 NOTE — Progress Notes (Unsigned)
Numeric Pain Rating Scale and Functional Assessment Average Pain 7   In the last MONTH (on 0-10 scale) has pain interfered with the following?  1. General activity like being  able to carry out your everyday physical activities such as walking, climbing stairs, carrying groceries, or moving a chair?  Rating(10)   +Driver, -BT- Aspirin 325 mg, -Dye Allergies.  Standing makes pain worse. Pain radiates down right leg

## 2021-11-28 NOTE — Progress Notes (Signed)
[name] - 61 y.o. male MRN [id number]  Date of birth: 06-08-1960  Office Visit Note: Visit Date: 11/25/2021 PCP: Barbie Banner, MD Referred by: Barbie Banner, MD  Subjective: No chief complaint on file.  HPI:  [name] is a 61 y.o. male who comes in today for planned repeat Left L4-5  Lumbar Interlaminar epidural steroid injection with fluoroscopic guidance.  The patient has failed conservative care including home exercise, medications, time and activity modification.  This injection will be diagnostic and hopefully therapeutic.  Please see requesting physician notes for further details and justification. Patient received more than 50% pain relief from prior injection.   Referring: Ellin Goodie, FNP   ROS Otherwise per HPI.  Assessment & Plan: Visit Diagnoses:    ICD-10-CM   1. Lumbar radiculopathy  M54.16 XR C-ARM NO REPORT    Epidural Steroid injection    methylPREDNISolone acetate (DEPO-MEDROL) injection 40 mg      Plan: No additional findings.   Meds & Orders:  Meds ordered this encounter  Medications   methylPREDNISolone acetate (DEPO-MEDROL) injection 40 mg    Orders Placed This Encounter  Procedures   XR C-ARM NO REPORT   Epidural Steroid injection    Follow-up: No follow-ups on file.   Procedures: No procedures performed  Lumbar Epidural Steroid Injection - Interlaminar Approach with Fluoroscopic Guidance  Patient: [name]      Date of Birth: 09/27/1960 MRN: [id number] PCP: Barbie Banner, MD      Visit Date: 11/25/2021   Universal Protocol:     Consent Given By: the patient  Position: PRONE  Additional Comments: Vital signs were monitored before and after the procedure. Patient was prepped and draped in the usual sterile fashion. The correct patient, procedure, and site was verified.   Injection Procedure Details:   Procedure diagnoses: Lumbar radiculopathy [M54.16]   Meds Administered:  Meds ordered this encounter   Medications   methylPREDNISolone acetate (DEPO-MEDROL) injection 40 mg     Laterality: Left  Location/Site:  L4-5  Needle: 3.5 in., 20 ga. Tuohy  Needle Placement: Paramedian epidural  Findings:   -Comments: Excellent flow of contrast into the epidural space.  Procedure Details: Using a paramedian approach from the side mentioned above, the region overlying the inferior lamina was localized under fluoroscopic visualization and the soft tissues overlying this structure were infiltrated with 4 ml. of 1% Lidocaine without Epinephrine. The Tuohy needle was inserted into the epidural space using a paramedian approach.   The epidural space was localized using loss of resistance along with counter oblique bi-planar fluoroscopic views.  After negative aspirate for air, blood, and CSF, a 2 ml. volume of Isovue-250 was injected into the epidural space and the flow of contrast was observed. Radiographs were obtained for documentation purposes.    The injectate was administered into the level noted above.   Additional Comments:  The patient tolerated the procedure well Dressing: 2 x 2 sterile gauze and Band-Aid    Post-procedure details: Patient was observed during the procedure. Post-procedure instructions were reviewed.  Patient left the clinic in stable condition.   Clinical History: MRI LUMBAR SPINE WITHOUT CONTRAST     TECHNIQUE:  Multiplanar, multisequence MR imaging of the lumbar spine was  performed. No intravenous contrast was administered.     COMPARISON:  Lumbar spine radiographs 02/19/2021     FINDINGS:  Segmentation:  Standard.     Alignment:  Normal.     Vertebrae: No  fracture or suspicious marrow lesion. Prominent  bilateral facet edema at L4-5.     Conus medullaris and cauda equina: Conus extends to the T12 level.  Conus and cauda equina appear normal.     Paraspinal and other soft tissues: Unremarkable.     Disc levels:     T12-L1 and L1-2: Negative.      L2-3: Mild disc desiccation. Mild disc bulging and mild facet  hypertrophy without significant stenosis.     L3-4: Mild disc desiccation. Moderate facet hypertrophy and at most  minimal disc bulging without stenosis.     L4-5: Mild disc desiccation. Disc bulging eccentric to the right and  severe facet and ligamentum flavum hypertrophy result in mild spinal  stenosis, mild-to-moderate bilateral lateral recess stenosis, and  mild-to-moderate right neural foraminal stenosis. The L5 nerve roots  could be affected in the lateral recesses.     L5-S1: Normal disc.  Moderate facet hypertrophy without stenosis.     IMPRESSION:  1. 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  2. Mild-to-moderate lateral recess and right neural foraminal  stenosis at L4-5.        Electronically Signed    By: Logan Bores M.D.    On: 03/05/2021 15:15     Objective:  VS:  HT:    WT:   BMI:     BP:137/89  HR:64bpm  TEMP: ( )  RESP:  Physical Exam Vitals and nursing note reviewed.  Constitutional:      General: He is not in acute distress.    Appearance: Normal appearance. He is not ill-appearing.  HENT:     Head: Normocephalic and atraumatic.     Right Ear: External ear normal.     Left Ear: External ear normal.     Nose: No congestion.  Eyes:     Extraocular Movements: Extraocular movements intact.  Cardiovascular:     Rate and Rhythm: Normal rate.     Pulses: Normal pulses.  Pulmonary:     Effort: Pulmonary effort is normal. No respiratory distress.  Abdominal:     General: There is no distension.     Palpations: Abdomen is soft.  Musculoskeletal:        General: No tenderness or signs of injury.     Cervical back: Neck supple.     Right lower leg: No edema.     Left lower leg: No edema.     Comments: Patient has good distal strength without clonus.  Skin:    Findings: No erythema or rash.  Neurological:     General: No focal deficit present.      Mental Status: He is alert and oriented to person, place, and time.     Sensory: No sensory deficit.     Motor: No weakness or abnormal muscle tone.     Coordination: Coordination normal.  Psychiatric:        Mood and Affect: Mood normal.        Behavior: Behavior normal.      Imaging: No results found.

## 2021-11-28 NOTE — Procedures (Signed)
Lumbar Epidural Steroid Injection - Interlaminar Approach with Fluoroscopic Guidance  Patient: [name]      Date of Birth: Feb 18, 1960 MRN: [id number] PCP: Christain Sacramento, MD      Visit Date: 11/25/2021   Universal Protocol:     Consent Given By: the patient  Position: PRONE  Additional Comments: Vital signs were monitored before and after the procedure. Patient was prepped and draped in the usual sterile fashion. The correct patient, procedure, and site was verified.   Injection Procedure Details:   Procedure diagnoses: Lumbar radiculopathy [M54.16]   Meds Administered:  Meds ordered this encounter  Medications   methylPREDNISolone acetate (DEPO-MEDROL) injection 40 mg     Laterality: Left  Location/Site:  L4-5  Needle: 3.5 in., 20 ga. Tuohy  Needle Placement: Paramedian epidural  Findings:   -Comments: Excellent flow of contrast into the epidural space.  Procedure Details: Using a paramedian approach from the side mentioned above, the region overlying the inferior lamina was localized under fluoroscopic visualization and the soft tissues overlying this structure were infiltrated with 4 ml. of 1% Lidocaine without Epinephrine. The Tuohy needle was inserted into the epidural space using a paramedian approach.   The epidural space was localized using loss of resistance along with counter oblique bi-planar fluoroscopic views.  After negative aspirate for air, blood, and CSF, a 2 ml. volume of Isovue-250 was injected into the epidural space and the flow of contrast was observed. Radiographs were obtained for documentation purposes.    The injectate was administered into the level noted above.   Additional Comments:  The patient tolerated the procedure well Dressing: 2 x 2 sterile gauze and Band-Aid    Post-procedure details: Patient was observed during the procedure. Post-procedure instructions were reviewed.  Patient left the clinic in stable condition.

## 2022-03-19 DIAGNOSIS — M5432 Sciatica, left side: Secondary | ICD-10-CM | POA: Insufficient documentation

## 2022-03-19 DIAGNOSIS — M5416 Radiculopathy, lumbar region: Secondary | ICD-10-CM | POA: Insufficient documentation

## 2022-03-27 ENCOUNTER — Ambulatory Visit: Payer: Medicare Other

## 2022-03-27 ENCOUNTER — Ambulatory Visit (INDEPENDENT_AMBULATORY_CARE_PROVIDER_SITE_OTHER): Payer: Medicare Other | Admitting: Physical Medicine and Rehabilitation

## 2022-03-27 VITALS — BP 154/92 | HR 73

## 2022-03-27 DIAGNOSIS — M5416 Radiculopathy, lumbar region: Secondary | ICD-10-CM

## 2022-03-27 MED ORDER — METHYLPREDNISOLONE ACETATE 80 MG/ML IJ SUSP
80.0000 mg | Freq: Once | INTRAMUSCULAR | Status: AC
  Administered 2022-03-27: 80 mg

## 2022-03-27 NOTE — Patient Instructions (Signed)

## 2022-03-27 NOTE — Progress Notes (Signed)
Functional Pain Scale - descriptive words and definitions  Unmanageable (7)  Pain interferes with normal ADL's/nothing seems to help/sleep is very difficult/active distractions are very difficult to concentrate on. Severe range order  Average Pain 5-6   +Driver, -BT, -Dye Allergies.  Lower back pain on both sides with radiation

## 2022-04-01 NOTE — Progress Notes (Signed)
[name] - 62 y.o. male MRN [id number]  Date of birth: Apr 06, 1960  Office Visit Note: Visit Date: 03/27/2022 PCP: Christain Sacramento, MD Referred by: Christain Sacramento, MD  Subjective: Chief Complaint  Patient presents with   Lower Back - Pain   HPI:  [name] is a 62 y.o. male who comes in today at the request of Barnet Pall, FNP for planned Left L4-5 Lumbar Interlaminar epidural steroid injection with fluoroscopic guidance.  The patient has failed conservative care including home exercise, medications, time and activity modification.  This injection will be diagnostic and hopefully therapeutic.  Please see requesting physician notes for further details and justification.   ROS Otherwise per HPI.  Assessment & Plan: Visit Diagnoses:    ICD-10-CM   1. Lumbar radiculopathy  M54.16 XR C-ARM NO REPORT    Epidural Steroid injection    methylPREDNISolone acetate (DEPO-MEDROL) injection 80 mg      Plan: No additional findings.   Meds & Orders:  Meds ordered this encounter  Medications   methylPREDNISolone acetate (DEPO-MEDROL) injection 80 mg    Orders Placed This Encounter  Procedures   XR C-ARM NO REPORT   Epidural Steroid injection    Follow-up: Return for visit to requesting provider as needed.   Procedures: No procedures performed  Lumbar Epidural Steroid Injection - Interlaminar Approach with Fluoroscopic Guidance  Patient: [name]      Date of Birth: Nov 14, 1960 MRN: [id number] PCP: Christain Sacramento, MD      Visit Date: 03/27/2022   Universal Protocol:     Consent Given By: the patient  Position: PRONE  Additional Comments: Vital signs were monitored before and after the procedure. Patient was prepped and draped in the usual sterile fashion. The correct patient, procedure, and site was verified.   Injection Procedure Details:   Procedure diagnoses: Lumbar radiculopathy [M54.16]   Meds Administered:  Meds ordered this encounter   Medications   methylPREDNISolone acetate (DEPO-MEDROL) injection 80 mg     Laterality: Left  Location/Site:  L4-5  Needle: 4.5 in., 20 ga. Tuohy  Needle Placement: Paramedian epidural  Findings:   -Comments: Excellent flow of contrast into the epidural space.  Procedure Details: Using a paramedian approach from the side mentioned above, the region overlying the inferior lamina was localized under fluoroscopic visualization and the soft tissues overlying this structure were infiltrated with 4 ml. of 1% Lidocaine without Epinephrine. The Tuohy needle was inserted into the epidural space using a paramedian approach.   The epidural space was localized using loss of resistance along with counter oblique bi-planar fluoroscopic views.  After negative aspirate for air, blood, and CSF, a 2 ml. volume of Isovue-250 was injected into the epidural space and the flow of contrast was observed. Radiographs were obtained for documentation purposes.    The injectate was administered into the level noted above.   Additional Comments:  No complications occurred Dressing: 2 x 2 sterile gauze and Band-Aid    Post-procedure details: Patient was observed during the procedure. Post-procedure instructions were reviewed.  Patient left the clinic in stable condition.   Clinical History: MRI LUMBAR SPINE WITHOUT CONTRAST     TECHNIQUE:  Multiplanar, multisequence MR imaging of the lumbar spine was  performed. No intravenous contrast was administered.     COMPARISON:  Lumbar spine radiographs 02/19/2021     FINDINGS:  Segmentation:  Standard.     Alignment:  Normal.     Vertebrae: No fracture or  suspicious marrow lesion. Prominent  bilateral facet edema at L4-5.     Conus medullaris and cauda equina: Conus extends to the T12 level.  Conus and cauda equina appear normal.     Paraspinal and other soft tissues: Unremarkable.     Disc levels:     T12-L1 and L1-2: Negative.     L2-3: Mild  disc desiccation. Mild disc bulging and mild facet  hypertrophy without significant stenosis.     L3-4: Mild disc desiccation. Moderate facet hypertrophy and at most  minimal disc bulging without stenosis.     L4-5: Mild disc desiccation. Disc bulging eccentric to the right and  severe facet and ligamentum flavum hypertrophy result in mild spinal  stenosis, mild-to-moderate bilateral lateral recess stenosis, and  mild-to-moderate right neural foraminal stenosis. The L5 nerve roots  could be affected in the lateral recesses.     L5-S1: Normal disc.  Moderate facet hypertrophy without stenosis.     IMPRESSION:  1. 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  2. Mild-to-moderate lateral recess and right neural foraminal  stenosis at L4-5.        Electronically Signed    By: Logan Bores M.D.    On: 03/05/2021 15:15     Objective:  VS:  HT:    WT:   BMI:     BP:(!) 154/92  HR:73bpm  TEMP: ( )  RESP:  Physical Exam Vitals and nursing note reviewed.  Constitutional:      General: He is not in acute distress.    Appearance: Normal appearance. He is not ill-appearing.  HENT:     Head: Normocephalic and atraumatic.     Right Ear: External ear normal.     Left Ear: External ear normal.     Nose: No congestion.  Eyes:     Extraocular Movements: Extraocular movements intact.  Cardiovascular:     Rate and Rhythm: Normal rate.     Pulses: Normal pulses.  Pulmonary:     Effort: Pulmonary effort is normal. No respiratory distress.  Abdominal:     General: There is no distension.     Palpations: Abdomen is soft.  Musculoskeletal:        General: No tenderness or signs of injury.     Cervical back: Neck supple.     Right lower leg: No edema.     Left lower leg: No edema.     Comments: Patient has good distal strength without clonus.  Skin:    Findings: No erythema or rash.  Neurological:     General: No focal deficit present.     Mental  Status: He is alert and oriented to person, place, and time.     Sensory: No sensory deficit.     Motor: No weakness or abnormal muscle tone.     Coordination: Coordination normal.  Psychiatric:        Mood and Affect: Mood normal.        Behavior: Behavior normal.      Imaging: No results found.

## 2022-04-01 NOTE — Procedures (Signed)
Lumbar Epidural Steroid Injection - Interlaminar Approach with Fluoroscopic Guidance  Patient: [name]      Date of Birth: 05/22/1960 MRN: [id number] PCP: Christain Sacramento, MD      Visit Date: 03/27/2022   Universal Protocol:     Consent Given By: the patient  Position: PRONE  Additional Comments: Vital signs were monitored before and after the procedure. Patient was prepped and draped in the usual sterile fashion. The correct patient, procedure, and site was verified.   Injection Procedure Details:   Procedure diagnoses: Lumbar radiculopathy [M54.16]   Meds Administered:  Meds ordered this encounter  Medications   methylPREDNISolone acetate (DEPO-MEDROL) injection 80 mg     Laterality: Left  Location/Site:  L4-5  Needle: 4.5 in., 20 ga. Tuohy  Needle Placement: Paramedian epidural  Findings:   -Comments: Excellent flow of contrast into the epidural space.  Procedure Details: Using a paramedian approach from the side mentioned above, the region overlying the inferior lamina was localized under fluoroscopic visualization and the soft tissues overlying this structure were infiltrated with 4 ml. of 1% Lidocaine without Epinephrine. The Tuohy needle was inserted into the epidural space using a paramedian approach.   The epidural space was localized using loss of resistance along with counter oblique bi-planar fluoroscopic views.  After negative aspirate for air, blood, and CSF, a 2 ml. volume of Isovue-250 was injected into the epidural space and the flow of contrast was observed. Radiographs were obtained for documentation purposes.    The injectate was administered into the level noted above.   Additional Comments:  No complications occurred Dressing: 2 x 2 sterile gauze and Band-Aid    Post-procedure details: Patient was observed during the procedure. Post-procedure instructions were reviewed.  Patient left the clinic in stable condition.

## 2022-07-15 ENCOUNTER — Ambulatory Visit (INDEPENDENT_AMBULATORY_CARE_PROVIDER_SITE_OTHER): Payer: Medicare Other | Admitting: Physical Medicine and Rehabilitation

## 2022-07-15 ENCOUNTER — Other Ambulatory Visit: Payer: Self-pay

## 2022-07-15 VITALS — BP 163/83 | HR 69

## 2022-07-15 DIAGNOSIS — M5416 Radiculopathy, lumbar region: Secondary | ICD-10-CM | POA: Diagnosis not present

## 2022-07-15 MED ORDER — METHYLPREDNISOLONE ACETATE 80 MG/ML IJ SUSP
80.0000 mg | Freq: Once | INTRAMUSCULAR | Status: AC
  Administered 2022-07-15: 80 mg

## 2022-07-15 NOTE — Progress Notes (Signed)
[name] - 62 y.o. male MRN [id number]  Date of birth: 08-24-1960  Office Visit Note: Visit Date: 07/15/2022 PCP: Barbie Banner, MD Referred by: Barbie Banner, MD  Subjective: Chief Complaint  Patient presents with   Lower Back - Pain   HPI:  [name] is a 62 y.o. male who comes in today at the request of Ellin Goodie, FNP for planned Right L4-5 Lumbar Interlaminar epidural steroid injection with fluoroscopic guidance.  The patient has failed conservative care including home exercise, medications, time and activity modification.  This injection will be diagnostic and hopefully therapeutic.  Please see requesting physician notes for further details and justification.  He has had a pretty complicated chronic pain history and this is well reviewed in the notes.  He has been getting the left L4-5 injections and doing quite well.  He comes in today with about a month of increasing pain very similar to in the past but actually bit more right-sided than left side of the last few weeks.  Last injection gave him about 4 months of pretty good relief overall he was quite happy.  We are going to complete a right-sided injection today but I did assure him and really does treat both sides.   ROS Otherwise per HPI.  Assessment & Plan: Visit Diagnoses:    ICD-10-CM   1. Lumbar radiculopathy  M54.16 XR C-ARM NO REPORT    Epidural Steroid injection    methylPREDNISolone acetate (DEPO-MEDROL) injection 80 mg      Plan: No additional findings.   Meds & Orders:  Meds ordered this encounter  Medications   methylPREDNISolone acetate (DEPO-MEDROL) injection 80 mg    Orders Placed This Encounter  Procedures   XR C-ARM NO REPORT   Epidural Steroid injection    Follow-up: Return for visit to requesting provider as needed.   Procedures: No procedures performed  Lumbar Epidural Steroid Injection - Interlaminar Approach with Fluoroscopic Guidance  Patient: [name]      Date  of Birth: [birth year] MRN: [id number] PCP: Barbie Banner, MD      Visit Date: 07/15/2022   Universal Protocol:     Consent Given By: the patient  Position: PRONE  Additional Comments: Vital signs were monitored before and after the procedure. Patient was prepped and draped in the usual sterile fashion. The correct patient, procedure, and site was verified.   Injection Procedure Details:   Procedure diagnoses: Lumbar radiculopathy [M54.16]   Meds Administered:  Meds ordered this encounter  Medications   methylPREDNISolone acetate (DEPO-MEDROL) injection 80 mg     Laterality: Right  Location/Site:  L4-5  Needle: 3.5 in., 20 ga. Tuohy  Needle Placement: Paramedian epidural  Findings:   -Comments: Excellent flow of contrast into the epidural space.  Procedure Details: Using a paramedian approach from the side mentioned above, the region overlying the inferior lamina was localized under fluoroscopic visualization and the soft tissues overlying this structure were infiltrated with 4 ml. of 1% Lidocaine without Epinephrine. The Tuohy needle was inserted into the epidural space using a paramedian approach.   The epidural space was localized using loss of resistance along with counter oblique bi-planar fluoroscopic views.  After negative aspirate for air, blood, and CSF, a 2 ml. volume of Isovue-250 was injected into the epidural space and the flow of contrast was observed. Radiographs were obtained for documentation purposes.    The injectate was administered into the level noted above.   Additional Comments:  No complications occurred Dressing: 2 x 2 sterile gauze and Band-Aid    Post-procedure details: Patient was observed during the procedure. Post-procedure instructions were reviewed.  Patient left the clinic in stable condition.   Clinical History: MRI LUMBAR SPINE WITHOUT CONTRAST     TECHNIQUE:  Multiplanar, multisequence MR imaging of the lumbar spine was   performed. No intravenous contrast was administered.     COMPARISON:  Lumbar spine radiographs 02/19/2021     FINDINGS:  Segmentation:  Standard.     Alignment:  Normal.     Vertebrae: No fracture or suspicious marrow lesion. Prominent  bilateral facet edema at L4-5.     Conus medullaris and cauda equina: Conus extends to the T12 level.  Conus and cauda equina appear normal.     Paraspinal and other soft tissues: Unremarkable.     Disc levels:     T12-L1 and L1-2: Negative.     L2-3: Mild disc desiccation. Mild disc bulging and mild facet  hypertrophy without significant stenosis.     L3-4: Mild disc desiccation. Moderate facet hypertrophy and at most  minimal disc bulging without stenosis.     L4-5: Mild disc desiccation. Disc bulging eccentric to the right and  severe facet and ligamentum flavum hypertrophy result in mild spinal  stenosis, mild-to-moderate bilateral lateral recess stenosis, and  mild-to-moderate right neural foraminal stenosis. The L5 nerve roots  could be affected in the lateral recesses.     L5-S1: Normal disc.  Moderate facet hypertrophy without stenosis.     IMPRESSION:  1. 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  2. Mild-to-moderate lateral recess and right neural foraminal  stenosis at L4-5.        Electronically Signed    By: Sebastian Ache M.D.    On: 03/05/2021 15:15     Objective:  VS:  HT:    WT:   BMI:     BP:(!) 163/83  HR:69bpm  TEMP: ( )  RESP:  Physical Exam Vitals and nursing note reviewed.  Constitutional:      General: He is not in acute distress.    Appearance: Normal appearance. He is not ill-appearing.  HENT:     Head: Normocephalic and atraumatic.     Right Ear: External ear normal.     Left Ear: External ear normal.     Nose: No congestion.  Eyes:     Extraocular Movements: Extraocular movements intact.  Cardiovascular:     Rate and Rhythm: Normal rate.     Pulses:  Normal pulses.  Pulmonary:     Effort: Pulmonary effort is normal. No respiratory distress.  Abdominal:     General: There is no distension.     Palpations: Abdomen is soft.  Musculoskeletal:        General: No tenderness or signs of injury.     Cervical back: Neck supple.     Right lower leg: No edema.     Left lower leg: No edema.     Comments: Patient has good distal strength without clonus.  Skin:    Findings: No erythema or rash.  Neurological:     General: No focal deficit present.     Mental Status: He is alert and oriented to person, place, and time.     Sensory: No sensory deficit.     Motor: No weakness or abnormal muscle tone.     Coordination: Coordination normal.  Psychiatric:  Mood and Affect: Mood normal.        Behavior: Behavior normal.      Imaging: No results found.

## 2022-07-15 NOTE — Procedures (Signed)
Lumbar Epidural Steroid Injection - Interlaminar Approach with Fluoroscopic Guidance  Patient: [name]      Date of Birth: 1960-04-12 MRN: [id number] PCP: Barbie Banner, MD      Visit Date: 07/15/2022   Universal Protocol:     Consent Given By: the patient  Position: PRONE  Additional Comments: Vital signs were monitored before and after the procedure. Patient was prepped and draped in the usual sterile fashion. The correct patient, procedure, and site was verified.   Injection Procedure Details:   Procedure diagnoses: Lumbar radiculopathy [M54.16]   Meds Administered:  Meds ordered this encounter  Medications   methylPREDNISolone acetate (DEPO-MEDROL) injection 80 mg     Laterality: Right  Location/Site:  L4-5  Needle: 3.5 in., 20 ga. Tuohy  Needle Placement: Paramedian epidural  Findings:   -Comments: Excellent flow of contrast into the epidural space.  Procedure Details: Using a paramedian approach from the side mentioned above, the region overlying the inferior lamina was localized under fluoroscopic visualization and the soft tissues overlying this structure were infiltrated with 4 ml. of 1% Lidocaine without Epinephrine. The Tuohy needle was inserted into the epidural space using a paramedian approach.   The epidural space was localized using loss of resistance along with counter oblique bi-planar fluoroscopic views.  After negative aspirate for air, blood, and CSF, a 2 ml. volume of Isovue-250 was injected into the epidural space and the flow of contrast was observed. Radiographs were obtained for documentation purposes.    The injectate was administered into the level noted above.   Additional Comments:  No complications occurred Dressing: 2 x 2 sterile gauze and Band-Aid    Post-procedure details: Patient was observed during the procedure. Post-procedure instructions were reviewed.  Patient left the clinic in stable condition.

## 2022-07-15 NOTE — Patient Instructions (Signed)

## 2022-07-15 NOTE — Progress Notes (Signed)
Functional Pain Scale - descriptive words and definitions  Distressing (6)    Pain is present/unable to complete most ADLs limited by pain/sleep is difficult and active distraction is only marginal. Moderate range order  Average Pain 6-9   +Driver, -BT, -Dye Allergies.  Lower back pain on right side that radiates into right leg to the ankle

## 2022-10-03 ENCOUNTER — Telehealth: Payer: Self-pay | Admitting: Physical Medicine and Rehabilitation

## 2022-10-03 DIAGNOSIS — M5416 Radiculopathy, lumbar region: Secondary | ICD-10-CM

## 2022-10-03 NOTE — Telephone Encounter (Signed)
LVM to return call to give more details

## 2022-10-03 NOTE — Telephone Encounter (Signed)
Patient called needing appt to get the back injection. CB#[phone]

## 2022-10-07 NOTE — Telephone Encounter (Signed)
Order for repeat injection placed 

## 2022-10-07 NOTE — Telephone Encounter (Signed)
Patient left voicemail stating the last injection from 07/15/22 lasted until recently and he had 100% relief. No new falls, accidents or injuries. Please advise.

## 2022-10-07 NOTE — Addendum Note (Signed)
Addended by: Sharlet Salina on: 10/07/2022 11:05 AM   Modules accepted: Orders

## 2022-10-21 ENCOUNTER — Ambulatory Visit (INDEPENDENT_AMBULATORY_CARE_PROVIDER_SITE_OTHER): Payer: Medicare Other | Admitting: Physical Medicine and Rehabilitation

## 2022-10-21 ENCOUNTER — Other Ambulatory Visit: Payer: Self-pay

## 2022-10-21 VITALS — BP 157/95 | HR 67

## 2022-10-21 DIAGNOSIS — M5416 Radiculopathy, lumbar region: Secondary | ICD-10-CM | POA: Diagnosis not present

## 2022-10-21 MED ORDER — METHYLPREDNISOLONE ACETATE 80 MG/ML IJ SUSP
80.0000 mg | Freq: Once | INTRAMUSCULAR | Status: AC
  Administered 2022-10-21: 80 mg

## 2022-10-21 NOTE — Progress Notes (Unsigned)
Functional Pain Scale - descriptive words and definitions  Distracting (5)    Aware of pain/able to complete some ADL's but limited by pain/sleep is affected and active distractions are only slightly useful. Moderate range order  Average Pain 4-10   +Driver, -BT, -Dye Allergies.  Lower back pain on right side that radiates into the right leg

## 2022-10-21 NOTE — Patient Instructions (Signed)

## 2022-10-22 NOTE — Progress Notes (Signed)
[name] - 62 y.o. male MRN [id number]  Date of birth: 1960/03/16  Office Visit Note: Visit Date: 10/21/2022 PCP: Barbie Banner, MD Referred by: Barbie Banner, MD  Subjective: Chief Complaint  Patient presents with   Lower Back - Pain   HPI:  [name] is a 62 y.o. male who comes in today for planned repeat Right L4-5  Lumbar Interlaminar epidural steroid injection with fluoroscopic guidance.  The patient has failed conservative care including home exercise, medications, time and activity modification.  This injection will be diagnostic and hopefully therapeutic.  Please see requesting physician notes for further details and justification. Patient received more than 50% pain relief from prior injection.   Referring: Ellin Goodie, FNP   ROS Otherwise per HPI.  Assessment & Plan: Visit Diagnoses:    ICD-10-CM   1. Lumbar radiculopathy  M54.16 XR C-ARM NO REPORT    Epidural Steroid injection    methylPREDNISolone acetate (DEPO-MEDROL) injection 80 mg      Plan: No additional findings.   Meds & Orders:  Meds ordered this encounter  Medications   methylPREDNISolone acetate (DEPO-MEDROL) injection 80 mg    Orders Placed This Encounter  Procedures   XR C-ARM NO REPORT   Epidural Steroid injection    Follow-up: Return if symptoms worsen or fail to improve.   Procedures: No procedures performed  Lumbar Epidural Steroid Injection - Interlaminar Approach with Fluoroscopic Guidance  Patient: [name]      Date of Birth: 04/06/1960 MRN: [id number] PCP: Barbie Banner, MD      Visit Date: 10/21/2022   Universal Protocol:     Consent Given By: the patient  Position: PRONE  Additional Comments: Vital signs were monitored before and after the procedure. Patient was prepped and draped in the usual sterile fashion. The correct patient, procedure, and site was verified.   Injection Procedure Details:   Procedure diagnoses: Lumbar radiculopathy  [M54.16]   Meds Administered:  Meds ordered this encounter  Medications   methylPREDNISolone acetate (DEPO-MEDROL) injection 80 mg     Laterality: Right  Location/Site:  L4-5  Needle: 3.5 in., 20 ga. Tuohy  Needle Placement: Paramedian epidural  Findings:   -Comments: Excellent flow of contrast into the epidural space.  Procedure Details: Using a paramedian approach from the side mentioned above, the region overlying the inferior lamina was localized under fluoroscopic visualization and the soft tissues overlying this structure were infiltrated with 4 ml. of 1% Lidocaine without Epinephrine. The Tuohy needle was inserted into the epidural space using a paramedian approach.   The epidural space was localized using loss of resistance along with counter oblique bi-planar fluoroscopic views.  After negative aspirate for air, blood, and CSF, a 2 ml. volume of Isovue-250 was injected into the epidural space and the flow of contrast was observed. Radiographs were obtained for documentation purposes.    The injectate was administered into the level noted above.   Additional Comments:  No complications occurred Dressing: 2 x 2 sterile gauze and Band-Aid    Post-procedure details: Patient was observed during the procedure. Post-procedure instructions were reviewed.  Patient left the clinic in stable condition.   Clinical History: MRI LUMBAR SPINE WITHOUT CONTRAST     TECHNIQUE:  Multiplanar, multisequence MR imaging of the lumbar spine was  performed. No intravenous contrast was administered.     COMPARISON:  Lumbar spine radiographs 02/19/2021     FINDINGS:  Segmentation:  Standard.  Alignment:  Normal.     Vertebrae: No fracture or suspicious marrow lesion. Prominent  bilateral facet edema at L4-5.     Conus medullaris and cauda equina: Conus extends to the T12 level.  Conus and cauda equina appear normal.     Paraspinal and other soft tissues: Unremarkable.      Disc levels:     T12-L1 and L1-2: Negative.     L2-3: Mild disc desiccation. Mild disc bulging and mild facet  hypertrophy without significant stenosis.     L3-4: Mild disc desiccation. Moderate facet hypertrophy and at most  minimal disc bulging without stenosis.     L4-5: Mild disc desiccation. Disc bulging eccentric to the right and  severe facet and ligamentum flavum hypertrophy result in mild spinal  stenosis, mild-to-moderate bilateral lateral recess stenosis, and  mild-to-moderate right neural foraminal stenosis. The L5 nerve roots  could be affected in the lateral recesses.     L5-S1: Normal disc.  Moderate facet hypertrophy without stenosis.     IMPRESSION:  1. 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  2. Mild-to-moderate lateral recess and right neural foraminal  stenosis at L4-5.        Electronically Signed    By: Sebastian Ache M.D.    On: 03/05/2021 15:15     Objective:  VS:  HT:    WT:   BMI:     BP:(!) 157/95  HR:67bpm  TEMP: ( )  RESP:  Physical Exam Vitals and nursing note reviewed.  Constitutional:      General: He is not in acute distress.    Appearance: Normal appearance. He is not ill-appearing.  HENT:     Head: Normocephalic and atraumatic.     Right Ear: External ear normal.     Left Ear: External ear normal.     Nose: No congestion.  Eyes:     Extraocular Movements: Extraocular movements intact.  Cardiovascular:     Rate and Rhythm: Normal rate.     Pulses: Normal pulses.  Pulmonary:     Effort: Pulmonary effort is normal. No respiratory distress.  Abdominal:     General: There is no distension.     Palpations: Abdomen is soft.  Musculoskeletal:        General: No tenderness or signs of injury.     Cervical back: Neck supple.     Right lower leg: No edema.     Left lower leg: No edema.     Comments: Patient has good distal strength without clonus.  Skin:    Findings: No erythema or rash.   Neurological:     General: No focal deficit present.     Mental Status: He is alert and oriented to person, place, and time.     Sensory: No sensory deficit.     Motor: No weakness or abnormal muscle tone.     Coordination: Coordination normal.  Psychiatric:        Mood and Affect: Mood normal.        Behavior: Behavior normal.      Imaging: XR C-ARM NO REPORT  Result Date: 10/21/2022 Please see Notes tab for imaging impression.

## 2022-10-22 NOTE — Procedures (Signed)
Lumbar Epidural Steroid Injection - Interlaminar Approach with Fluoroscopic Guidance  Patient: [name]      Date of Birth: Oct 30, 1960 MRN: [id number] PCP: Barbie Banner, MD      Visit Date: 10/21/2022   Universal Protocol:     Consent Given By: the patient  Position: PRONE  Additional Comments: Vital signs were monitored before and after the procedure. Patient was prepped and draped in the usual sterile fashion. The correct patient, procedure, and site was verified.   Injection Procedure Details:   Procedure diagnoses: Lumbar radiculopathy [M54.16]   Meds Administered:  Meds ordered this encounter  Medications   methylPREDNISolone acetate (DEPO-MEDROL) injection 80 mg     Laterality: Right  Location/Site:  L4-5  Needle: 3.5 in., 20 ga. Tuohy  Needle Placement: Paramedian epidural  Findings:   -Comments: Excellent flow of contrast into the epidural space.  Procedure Details: Using a paramedian approach from the side mentioned above, the region overlying the inferior lamina was localized under fluoroscopic visualization and the soft tissues overlying this structure were infiltrated with 4 ml. of 1% Lidocaine without Epinephrine. The Tuohy needle was inserted into the epidural space using a paramedian approach.   The epidural space was localized using loss of resistance along with counter oblique bi-planar fluoroscopic views.  After negative aspirate for air, blood, and CSF, a 2 ml. volume of Isovue-250 was injected into the epidural space and the flow of contrast was observed. Radiographs were obtained for documentation purposes.    The injectate was administered into the level noted above.   Additional Comments:  No complications occurred Dressing: 2 x 2 sterile gauze and Band-Aid    Post-procedure details: Patient was observed during the procedure. Post-procedure instructions were reviewed.  Patient left the clinic in stable condition.

## 2022-11-13 DIAGNOSIS — F329 Major depressive disorder, single episode, unspecified: Secondary | ICD-10-CM | POA: Insufficient documentation

## 2023-02-02 ENCOUNTER — Telehealth: Payer: Self-pay | Admitting: Physical Medicine and Rehabilitation

## 2023-02-02 ENCOUNTER — Telehealth: Payer: Self-pay | Admitting: Radiology

## 2023-02-02 ENCOUNTER — Other Ambulatory Visit: Payer: Self-pay | Admitting: Physical Medicine and Rehabilitation

## 2023-02-02 DIAGNOSIS — M5416 Radiculopathy, lumbar region: Secondary | ICD-10-CM

## 2023-02-02 NOTE — Telephone Encounter (Signed)
 Patient left voicemail for Dr. Alvester Morin asking for return call to schedule injection.

## 2023-02-02 NOTE — Telephone Encounter (Signed)
 Pt called requesting an appt for back injection. Pt last injection 9/24. Pt phone number is [phone].

## 2023-02-02 NOTE — Telephone Encounter (Signed)
 I spoke with patient. He is status post right L4-5 ESI on 10/21/2022.  He states that he received 100% relief and has just recently started having return of symptoms. Symptoms are the same. He requests repeat injection.  Insurance is still Medicare.   Please advise.

## 2023-02-02 NOTE — Telephone Encounter (Signed)
 noted

## 2023-02-16 ENCOUNTER — Ambulatory Visit: Payer: Medicare Other | Admitting: Physical Medicine and Rehabilitation

## 2023-02-16 ENCOUNTER — Other Ambulatory Visit: Payer: Self-pay

## 2023-02-16 DIAGNOSIS — M5416 Radiculopathy, lumbar region: Secondary | ICD-10-CM | POA: Diagnosis not present

## 2023-02-16 MED ORDER — METHYLPREDNISOLONE ACETATE 40 MG/ML IJ SUSP
40.0000 mg | Freq: Once | INTRAMUSCULAR | Status: AC
Start: 2023-02-16 — End: 2023-02-16
  Administered 2023-02-16: 40 mg

## 2023-02-16 NOTE — Patient Instructions (Signed)

## 2023-02-16 NOTE — Progress Notes (Signed)
Functional Pain Scale - descriptive words and definitions  Uncomfortable (3)  Pain is present but can complete all ADL's/sleep is slightly affected and passive distraction only gives marginal relief. Mild range order  Average Pain 4  He stated he is thinking about the spinal cord stem depending on how this shot goes.   +Driver, -BT, -Dye Allergies.

## 2023-02-25 NOTE — Progress Notes (Signed)
[name] - 63 y.o. male MRN [id number]  Date of birth: Nov 03, 1960  Office Visit Note: Visit Date: 02/16/2023 PCP: Barbie Banner, MD Referred by: Barbie Banner, MD  Subjective: Chief Complaint  Patient presents with   Lower Back - Pain   HPI:  [name] is a 63 y.o. male who comes in today for planned repeat Right L4-5  Lumbar Interlaminar epidural steroid injection with fluoroscopic guidance.  The patient has failed conservative care including home exercise, medications, time and activity modification.  This injection will be diagnostic and hopefully therapeutic.  Please see requesting physician notes for further details and justification. Patient received more than 50% pain relief from prior injection.   Referring: Ellin Goodie, FNP   ROS Otherwise per HPI.  Assessment & Plan: Visit Diagnoses:    ICD-10-CM   1. Lumbar radiculopathy  M54.16 XR C-ARM NO REPORT    Epidural Steroid injection    methylPREDNISolone acetate (DEPO-MEDROL) injection 40 mg      Plan: No additional findings.   Meds & Orders:  Meds ordered this encounter  Medications   methylPREDNISolone acetate (DEPO-MEDROL) injection 40 mg    Orders Placed This Encounter  Procedures   XR C-ARM NO REPORT   Epidural Steroid injection    Follow-up: Return for visit to requesting provider as needed.   Procedures: No procedures performed  Lumbar Epidural Steroid Injection - Interlaminar Approach with Fluoroscopic Guidance  Patient: [name]      Date of Birth: 1960/11/23 MRN: [id number] PCP: Barbie Banner, MD      Visit Date: 02/16/2023   Universal Protocol:     Consent Given By: the patient  Position: PRONE  Additional Comments: Vital signs were monitored before and after the procedure. Patient was prepped and draped in the usual sterile fashion. The correct patient, procedure, and site was verified.   Injection Procedure Details:   Procedure diagnoses: Lumbar radiculopathy  [M54.16]   Meds Administered:  Meds ordered this encounter  Medications   methylPREDNISolone acetate (DEPO-MEDROL) injection 40 mg     Laterality: Right  Location/Site:  L4-5  Needle: 4.5 in., 20 ga. Tuohy  Needle Placement: Paramedian epidural  Findings:   -Comments: Excellent flow of contrast into the epidural space.  Procedure Details: Using a paramedian approach from the side mentioned above, the region overlying the inferior lamina was localized under fluoroscopic visualization and the soft tissues overlying this structure were infiltrated with 4 ml. of 1% Lidocaine without Epinephrine. The Tuohy needle was inserted into the epidural space using a paramedian approach.   The epidural space was localized using loss of resistance along with counter oblique bi-planar fluoroscopic views.  After negative aspirate for air, blood, and CSF, a 2 ml. volume of Isovue-250 was injected into the epidural space and the flow of contrast was observed. Radiographs were obtained for documentation purposes.    The injectate was administered into the level noted above.   Additional Comments:  No complications occurred Dressing: 2 x 2 sterile gauze and Band-Aid    Post-procedure details: Patient was observed during the procedure. Post-procedure instructions were reviewed.  Patient left the clinic in stable condition.   Clinical History: MRI LUMBAR SPINE WITHOUT CONTRAST     TECHNIQUE:  Multiplanar, multisequence MR imaging of the lumbar spine was  performed. No intravenous contrast was administered.     COMPARISON:  Lumbar spine radiographs 02/19/2021     FINDINGS:  Segmentation:  Standard.  Alignment:  Normal.     Vertebrae: No fracture or suspicious marrow lesion. Prominent  bilateral facet edema at L4-5.     Conus medullaris and cauda equina: Conus extends to the T12 level.  Conus and cauda equina appear normal.     Paraspinal and other soft tissues: Unremarkable.      Disc levels:     T12-L1 and L1-2: Negative.     L2-3: Mild disc desiccation. Mild disc bulging and mild facet  hypertrophy without significant stenosis.     L3-4: Mild disc desiccation. Moderate facet hypertrophy and at most  minimal disc bulging without stenosis.     L4-5: Mild disc desiccation. Disc bulging eccentric to the right and  severe facet and ligamentum flavum hypertrophy result in mild spinal  stenosis, mild-to-moderate bilateral lateral recess stenosis, and  mild-to-moderate right neural foraminal stenosis. The L5 nerve roots  could be affected in the lateral recesses.     L5-S1: Normal disc.  Moderate facet hypertrophy without stenosis.     IMPRESSION:  1. 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  2. Mild-to-moderate lateral recess and right neural foraminal  stenosis at L4-5.        Electronically Signed    By: Sebastian Ache M.D.    On: 03/05/2021 15:15     Objective:  VS:  HT:    WT:   BMI:     BP:   HR: bpm  TEMP: ( )  RESP:  Physical Exam Vitals and nursing note reviewed.  Constitutional:      General: He is not in acute distress.    Appearance: Normal appearance. He is not ill-appearing.  HENT:     Head: Normocephalic and atraumatic.     Right Ear: External ear normal.     Left Ear: External ear normal.     Nose: No congestion.  Eyes:     Extraocular Movements: Extraocular movements intact.  Cardiovascular:     Rate and Rhythm: Normal rate.     Pulses: Normal pulses.  Pulmonary:     Effort: Pulmonary effort is normal. No respiratory distress.  Abdominal:     General: There is no distension.     Palpations: Abdomen is soft.  Musculoskeletal:        General: No tenderness or signs of injury.     Cervical back: Neck supple.     Right lower leg: No edema.     Left lower leg: No edema.     Comments: Patient has good distal strength without clonus.  Skin:    Findings: No erythema or rash.  Neurological:      General: No focal deficit present.     Mental Status: He is alert and oriented to person, place, and time.     Sensory: No sensory deficit.     Motor: No weakness or abnormal muscle tone.     Coordination: Coordination normal.  Psychiatric:        Mood and Affect: Mood normal.        Behavior: Behavior normal.      Imaging: No results found.

## 2023-02-25 NOTE — Procedures (Signed)
Lumbar Epidural Steroid Injection - Interlaminar Approach with Fluoroscopic Guidance  Patient: [name]      Date of Birth: 13-Jul-1960 MRN: [id number] PCP: Barbie Banner, MD      Visit Date: 02/16/2023   Universal Protocol:     Consent Given By: the patient  Position: PRONE  Additional Comments: Vital signs were monitored before and after the procedure. Patient was prepped and draped in the usual sterile fashion. The correct patient, procedure, and site was verified.   Injection Procedure Details:   Procedure diagnoses: Lumbar radiculopathy [M54.16]   Meds Administered:  Meds ordered this encounter  Medications   methylPREDNISolone acetate (DEPO-MEDROL) injection 40 mg     Laterality: Right  Location/Site:  L4-5  Needle: 4.5 in., 20 ga. Tuohy  Needle Placement: Paramedian epidural  Findings:   -Comments: Excellent flow of contrast into the epidural space.  Procedure Details: Using a paramedian approach from the side mentioned above, the region overlying the inferior lamina was localized under fluoroscopic visualization and the soft tissues overlying this structure were infiltrated with 4 ml. of 1% Lidocaine without Epinephrine. The Tuohy needle was inserted into the epidural space using a paramedian approach.   The epidural space was localized using loss of resistance along with counter oblique bi-planar fluoroscopic views.  After negative aspirate for air, blood, and CSF, a 2 ml. volume of Isovue-250 was injected into the epidural space and the flow of contrast was observed. Radiographs were obtained for documentation purposes.    The injectate was administered into the level noted above.   Additional Comments:  No complications occurred Dressing: 2 x 2 sterile gauze and Band-Aid    Post-procedure details: Patient was observed during the procedure. Post-procedure instructions were reviewed.  Patient left the clinic in stable condition.

## 2023-05-21 ENCOUNTER — Other Ambulatory Visit: Payer: Self-pay | Admitting: Physical Medicine and Rehabilitation

## 2023-05-21 ENCOUNTER — Telehealth: Payer: Self-pay

## 2023-05-21 ENCOUNTER — Telehealth: Payer: Self-pay | Admitting: Physical Medicine and Rehabilitation

## 2023-05-21 DIAGNOSIS — M5416 Radiculopathy, lumbar region: Secondary | ICD-10-CM

## 2023-05-21 NOTE — Telephone Encounter (Signed)
%   100 relief/ function ability Duration of relief/ improvement--4 months Current pain score--6 Recent falls or injuries--NONE Location of pain lower back radiating to right leg January 2025

## 2023-05-21 NOTE — Telephone Encounter (Signed)
 Patient called and wants to get an appointment for injection in back. CB#[phone]

## 2023-06-04 ENCOUNTER — Other Ambulatory Visit: Payer: Self-pay

## 2023-06-04 ENCOUNTER — Ambulatory Visit: Admitting: Physical Medicine and Rehabilitation

## 2023-06-04 VITALS — BP 117/74 | HR 60

## 2023-06-04 DIAGNOSIS — M5416 Radiculopathy, lumbar region: Secondary | ICD-10-CM | POA: Diagnosis not present

## 2023-06-04 MED ORDER — METHYLPREDNISOLONE ACETATE 40 MG/ML IJ SUSP
40.0000 mg | Freq: Once | INTRAMUSCULAR | Status: AC
Start: 2023-06-04 — End: 2023-06-04
  Administered 2023-06-04: 40 mg

## 2023-06-04 NOTE — Patient Instructions (Signed)

## 2023-06-04 NOTE — Procedures (Signed)
 Lumbar Epidural Steroid Injection - Interlaminar Approach with Fluoroscopic Guidance  Patient: [name]      Date of Birth: 09-Apr-1960 MRN: [id number] PCP: Tura Gaines, MD      Visit Date: 06/04/2023   Universal Protocol:     Consent Given By: the patient  Position: PRONE  Additional Comments: Vital signs were monitored before and after the procedure. Patient was prepped and draped in the usual sterile fashion. The correct patient, procedure, and site was verified.   Injection Procedure Details:   Procedure diagnoses: Lumbar radiculopathy [M54.16]   Meds Administered:  Meds ordered this encounter  Medications   methylPREDNISolone  acetate (DEPO-MEDROL ) injection 40 mg     Laterality: Right  Location/Site:  L4-5  Needle: 4.5 in., 20 ga. Tuohy  Needle Placement: Paramedian epidural  Findings:   -Comments: Excellent flow of contrast into the epidural space.  Procedure Details: Using a paramedian approach from the side mentioned above, the region overlying the inferior lamina was localized under fluoroscopic visualization and the soft tissues overlying this structure were infiltrated with 4 ml. of 1% Lidocaine  without Epinephrine . The Tuohy needle was inserted into the epidural space using a paramedian approach.   The epidural space was localized using loss of resistance along with counter oblique bi-planar fluoroscopic views.  After negative aspirate for air, blood, and CSF, a 2 ml. volume of Isovue-250 was injected into the epidural space and the flow of contrast was observed. Radiographs were obtained for documentation purposes.    The injectate was administered into the level noted above.   Additional Comments:  The patient tolerated the procedure well Dressing: 2 x 2 sterile gauze and Band-Aid    Post-procedure details: Patient was observed during the procedure. Post-procedure instructions were reviewed.  Patient left the clinic in stable condition.

## 2023-06-04 NOTE — Progress Notes (Signed)
 Pain Scale   Average Pain 7 Patient advised he has lower back pain radiating to right leg.        +Driver, -BT, -Dye Allergies.

## 2023-06-04 NOTE — Progress Notes (Signed)
 MIC BUTSCH - 63 y.o. male MRN [id number]  Date of birth: Sep 13, 1960  Office Visit Note: Visit Date: 06/04/2023 PCP: Tura Gaines, MD Referred by: Tura Gaines, MD  Subjective: Chief Complaint  Patient presents with   Lower Back - Pain   HPI:  [name] is a 63 y.o. male who comes in today for planned repeat Right L4-5  Lumbar Interlaminar epidural steroid injection with fluoroscopic guidance.  The patient has failed conservative care including home exercise, medications, time and activity modification.  This injection will be diagnostic and hopefully therapeutic.  Please see requesting physician notes for further details and justification. Patient received more than 50% pain relief from prior injection.   Referring: Elvan Hamel, FNP   ROS Otherwise per HPI.  Assessment & Plan: Visit Diagnoses:    ICD-10-CM   1. Lumbar radiculopathy  M54.16 XR C-ARM NO REPORT    Epidural Steroid injection    methylPREDNISolone  acetate (DEPO-MEDROL ) injection 40 mg      Plan: No additional findings.   Meds & Orders:  Meds ordered this encounter  Medications   methylPREDNISolone  acetate (DEPO-MEDROL ) injection 40 mg    Orders Placed This Encounter  Procedures   XR C-ARM NO REPORT   Epidural Steroid injection    Follow-up: Return if symptoms worsen or fail to improve.   Procedures: No procedures performed  Lumbar Epidural Steroid Injection - Interlaminar Approach with Fluoroscopic Guidance  Patient: [name]      Date of Birth: 1960/08/02 MRN: [id number] PCP: Tura Gaines, MD      Visit Date: 06/04/2023   Universal Protocol:     Consent Given By: the patient  Position: PRONE  Additional Comments: Vital signs were monitored before and after the procedure. Patient was prepped and draped in the usual sterile fashion. The correct patient, procedure, and site was verified.   Injection Procedure Details:   Procedure diagnoses: Lumbar radiculopathy  [M54.16]   Meds Administered:  Meds ordered this encounter  Medications   methylPREDNISolone  acetate (DEPO-MEDROL ) injection 40 mg     Laterality: Right  Location/Site:  L4-5  Needle: 4.5 in., 20 ga. Tuohy  Needle Placement: Paramedian epidural  Findings:   -Comments: Excellent flow of contrast into the epidural space.  Procedure Details: Using a paramedian approach from the side mentioned above, the region overlying the inferior lamina was localized under fluoroscopic visualization and the soft tissues overlying this structure were infiltrated with 4 ml. of 1% Lidocaine  without Epinephrine . The Tuohy needle was inserted into the epidural space using a paramedian approach.   The epidural space was localized using loss of resistance along with counter oblique bi-planar fluoroscopic views.  After negative aspirate for air, blood, and CSF, a 2 ml. volume of Isovue-250 was injected into the epidural space and the flow of contrast was observed. Radiographs were obtained for documentation purposes.    The injectate was administered into the level noted above.   Additional Comments:  The patient tolerated the procedure well Dressing: 2 x 2 sterile gauze and Band-Aid    Post-procedure details: Patient was observed during the procedure. Post-procedure instructions were reviewed.  Patient left the clinic in stable condition.   Clinical History: MRI LUMBAR SPINE WITHOUT CONTRAST     TECHNIQUE:  Multiplanar, multisequence MR imaging of the lumbar spine was  performed. No intravenous contrast was administered.     COMPARISON:  Lumbar spine radiographs 02/19/2021     FINDINGS:  Segmentation:  Standard.  Alignment:  Normal.     Vertebrae: No fracture or suspicious marrow lesion. Prominent  bilateral facet edema at L4-5.     Conus medullaris and cauda equina: Conus extends to the T12 level.  Conus and cauda equina appear normal.     Paraspinal and other soft tissues:  Unremarkable.     Disc levels:     T12-L1 and L1-2: Negative.     L2-3: Mild disc desiccation. Mild disc bulging and mild facet  hypertrophy without significant stenosis.     L3-4: Mild disc desiccation. Moderate facet hypertrophy and at most  minimal disc bulging without stenosis.     L4-5: Mild disc desiccation. Disc bulging eccentric to the right and  severe facet and ligamentum flavum hypertrophy result in mild spinal  stenosis, mild-to-moderate bilateral lateral recess stenosis, and  mild-to-moderate right neural foraminal stenosis. The L5 nerve roots  could be affected in the lateral recesses.     L5-S1: Normal disc.  Moderate facet hypertrophy without stenosis.     IMPRESSION:  1. 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  2. Mild-to-moderate lateral recess and right neural foraminal  stenosis at L4-5.        Electronically Signed    By: Aundra Lee M.D.    On: 03/05/2021 15:15     Objective:  VS:  HT:    WT:   BMI:     BP:117/74  HR:60bpm  TEMP: ( )  RESP:  Physical Exam Vitals and nursing note reviewed.  Constitutional:      General: He is not in acute distress.    Appearance: Normal appearance. He is not ill-appearing.  HENT:     Head: Normocephalic and atraumatic.     Right Ear: External ear normal.     Left Ear: External ear normal.     Nose: No congestion.  Eyes:     Extraocular Movements: Extraocular movements intact.  Cardiovascular:     Rate and Rhythm: Normal rate.     Pulses: Normal pulses.  Pulmonary:     Effort: Pulmonary effort is normal. No respiratory distress.  Abdominal:     General: There is no distension.     Palpations: Abdomen is soft.  Musculoskeletal:        General: No tenderness or signs of injury.     Cervical back: Neck supple.     Right lower leg: No edema.     Left lower leg: No edema.     Comments: Patient has good distal strength without clonus.  Skin:    Findings: No erythema  or rash.  Neurological:     General: No focal deficit present.     Mental Status: He is alert and oriented to person, place, and time.     Sensory: No sensory deficit.     Motor: No weakness or abnormal muscle tone.     Coordination: Coordination normal.  Psychiatric:        Mood and Affect: Mood normal.        Behavior: Behavior normal.      Imaging: No results found.

## 2023-07-07 ENCOUNTER — Ambulatory Visit: Payer: Self-pay | Admitting: General Surgery

## 2023-07-07 NOTE — H&P (Signed)
 Chief Complaint: New Consultation     History of Present Illness: Jeanpierre Thebeau Deer is a 63 y.o. male who is seen today as an office consultation at the request of Dr. Milton Alpers for evaluation of New Consultation .   History of Present Illness Doral L Marucci is a 63 year old male who presents with recurrence of a left-sided hernia. He is accompanied by his daughter-in-law.  Approximately six months ago, he noticed the recurrence of his left-sided hernia, which has progressively increased in size. He previously underwent a robotic hernia repair in 2022 with mesh placement. He experiences a pinching sensation when the hernia protrudes while lying down, which is alleviated by manual reduction. He describes his activity level as not very active.     Review of Systems: A complete review of systems was obtained from the patient.  I have reviewed this information and discussed as appropriate with the patient.  See HPI as well for other ROS.  Review of Systems  Constitutional:  Negative for fever.  HENT:  Negative for congestion.   Eyes:  Negative for blurred vision.  Respiratory:  Negative for cough, shortness of breath and wheezing.   Cardiovascular:  Negative for chest pain and palpitations.  Gastrointestinal:  Negative for heartburn.  Genitourinary:  Negative for dysuria.  Musculoskeletal:  Negative for myalgias.  Skin:  Negative for rash.  Neurological:  Negative for dizziness and headaches.  Psychiatric/Behavioral:  Negative for depression and suicidal ideas.   All other systems reviewed and are negative.     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/2011  LEG WOUND REPAIR / CLOSURE Left 04/2011  HERNIA REPAIR      No Known  Allergies  Current Outpatient Medications on File Prior to Visit Medication Sig Dispense Refill  oxyCODONE  (OXYCONTIN ) 20 MG CR tablet Take 20 mg by mouth 3 (three) times daily    pregabalin  (LYRICA ) 300 MG capsule Take 300 mg by mouth 2 (two) times daily    citalopram (CELEXA) 20 MG tablet Take 20 mg by mouth once daily    cyanocobalamin (VITAMIN B12) 1000 MCG tablet Take 1,000 mcg by mouth once daily (Patient not taking: Reported on 07/07/2023)    FLUoxetine (PROZAC) 40 MG capsule Take 40 mg by mouth once daily (Patient not taking: Reported on 07/07/2023)    methocarbamoL  (ROBAXIN ) 750 MG tablet Take by mouth Twice daily (Patient not taking: Reported on 07/07/2023)    multivitamin with minerals tablet Take 1 tablet by mouth once daily (Patient not taking: Reported on 07/07/2023)    pregabalin  (LYRICA ) 300 MG capsule Take 1 capsule (300 mg total) by mouth 2 (two) times daily for 7 days 14 capsule 0  pregabalin  (LYRICA ) 300 MG capsule TAKE 1 CAPSULE (300 MG TOTAL) BY MOUTH 2 (TWO) TIMES DAILY FOR 180 DAYS 180 capsule 1  testosterone  cypionate (DEPO-TESTOSTERONE ) 200 mg/mL injection Inject 200 mg into the muscle every 28 (twenty-eight) days (Patient not taking: Reported on 07/07/2023)    No current facility-administered medications on file prior to visit.   History reviewed. No pertinent family history.   Social History  Tobacco Use Smoking Status Former  Types: Cigarettes Smokeless Tobacco Not on file    Social History  Socioeconomic History  Marital status: Married Tobacco Use  Smoking  status: Former   Types: Cigarettes Vaping Use  Vaping status: Unknown Substance and Sexual Activity  Alcohol use: Never  Drug use: Never  Social Drivers of Health  Food Insecurity: Low Risk  (05/19/2023)  Received from [facility]  Hunger Vital Sign   Within the past 12 months, you worried that your food would run out before you got money to buy more: Never true   Within the past 12 months,  the food you bought just didn't last and you didn't have money to get more. : Never true Transportation Needs: No Transportation Needs (05/19/2023)  Received from [employer]   In the past 12 months, has lack of reliable transportation kept you from medical appointments, meetings, work or from getting things needed for daily living? : No  Received from [employer]  Social Network Housing Stability: Unknown (07/07/2023)  Housing Stability Vital Sign   Homeless in the Last Year: No   Objective:   Vitals:  07/07/23 1336 07/07/23 1337 BP: (!) 140/78  Pulse: 64  Temp: 36.8 C (98.3 F)  SpO2: 97%  Weight: (!) 107.6 kg (237 lb 3.2 oz)  Height: 175.3 cm (5\' 9" )  PainSc:  0-No pain   Body mass index is 35.03 kg/m. Physical Exam Constitutional:      Appearance: Normal appearance.  HENT:     Head: Normocephalic and atraumatic.     Nose: Nose normal. No congestion.     Mouth/Throat:     Mouth: Mucous membranes are moist.     Pharynx: Oropharynx is clear.   Eyes:     Pupils: Pupils are equal, round, and reactive to light.    Cardiovascular:     Rate and Rhythm: Normal rate and regular rhythm.     Pulses: Normal pulses.     Heart sounds: Normal heart sounds. No murmur heard.    No friction rub. No gallop.  Pulmonary:     Effort: Pulmonary effort is normal. No respiratory distress.     Breath sounds: Normal breath sounds. No stridor. No wheezing, rhonchi or rales.  Abdominal:     General: Abdomen is flat.     Hernia: A hernia is present. Hernia is present in the left inguinal area. There is no hernia in the right inguinal area.   Musculoskeletal:        General: Normal range of motion.     Cervical back: Normal range of motion.   Skin:    General: Skin is warm and dry.   Neurological:     General: No focal deficit present.     Mental Status: He is alert and oriented to person, place, and time.   Psychiatric:        Mood and Affect: Mood normal.         Thought Content: Thought content normal.       Assessment and Plan: Diagnoses and all orders for this visit:  Recurrent left inguinal hernia    Randolf L Coates is a 63 y.o. male   1.  We will proceed to the OR for a open left inguinal hernia repair with mesh. 2. All risks and benefits were discussed with the patient, to generally include infection, bleeding, damage to surrounding structures, acute and chronic nerve pain, and recurrence. Alternatives were offered and described.  All questions were answered and the patient voiced understanding of the procedure and wishes to proceed at this point.       No follow-ups on file.  Shalamar Crays  Melton Squires, MD, [facility], [place] [facility]

## 2023-07-07 NOTE — H&P (View-Only) (Signed)
 Chief Complaint: New Consultation     History of Present Illness: [name] is a 63 y.o. male who is seen today as an office consultation at the request of Dr. Milton Alpers for evaluation of New Consultation .   History of Present Illness [name] is a 63 year old male who presents with recurrence of a left-sided hernia. He is accompanied by his daughter-in-law.  Approximately six months ago, he noticed the recurrence of his left-sided hernia, which has progressively increased in size. He previously underwent a robotic hernia repair in 2022 with mesh placement. He experiences a pinching sensation when the hernia protrudes while lying down, which is alleviated by manual reduction. He describes his activity level as not very active.     Review of Systems: A complete review of systems was obtained from the patient.  I have reviewed this information and discussed as appropriate with the patient.  See HPI as well for other ROS.  Review of Systems  Constitutional:  Negative for fever.  HENT:  Negative for congestion.   Eyes:  Negative for blurred vision.  Respiratory:  Negative for cough, shortness of breath and wheezing.   Cardiovascular:  Negative for chest pain and palpitations.  Gastrointestinal:  Negative for heartburn.  Genitourinary:  Negative for dysuria.  Musculoskeletal:  Negative for myalgias.  Skin:  Negative for rash.  Neurological:  Negative for dizziness and headaches.  Psychiatric/Behavioral:  Negative for depression and suicidal ideas.   All other systems reviewed and are negative.     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/2011  LEG WOUND REPAIR / CLOSURE Left 04/2011  HERNIA REPAIR      No Known  Allergies  Current Outpatient Medications on File Prior to Visit Medication Sig Dispense Refill  oxyCODONE  (OXYCONTIN ) 20 MG CR tablet Take 20 mg by mouth 3 (three) times daily    pregabalin  (LYRICA ) 300 MG capsule Take 300 mg by mouth 2 (two) times daily    citalopram (CELEXA) 20 MG tablet Take 20 mg by mouth once daily    cyanocobalamin (VITAMIN B12) 1000 MCG tablet Take 1,000 mcg by mouth once daily (Patient not taking: Reported on 07/07/2023)    FLUoxetine (PROZAC) 40 MG capsule Take 40 mg by mouth once daily (Patient not taking: Reported on 07/07/2023)    methocarbamoL  (ROBAXIN ) 750 MG tablet Take by mouth Twice daily (Patient not taking: Reported on 07/07/2023)    multivitamin with minerals tablet Take 1 tablet by mouth once daily (Patient not taking: Reported on 07/07/2023)    pregabalin  (LYRICA ) 300 MG capsule Take 1 capsule (300 mg total) by mouth 2 (two) times daily for 7 days 14 capsule 0  pregabalin  (LYRICA ) 300 MG capsule TAKE 1 CAPSULE (300 MG TOTAL) BY MOUTH 2 (TWO) TIMES DAILY FOR 180 DAYS 180 capsule 1  testosterone  cypionate (DEPO-TESTOSTERONE ) 200 mg/mL injection Inject 200 mg into the muscle every 28 (twenty-eight) days (Patient not taking: Reported on 07/07/2023)    No current facility-administered medications on file prior to visit.   History reviewed. No pertinent family history.   Social History  Tobacco Use Smoking Status Former  Types: Cigarettes Smokeless Tobacco Not on file    Social History  Socioeconomic History  Marital status: Married Tobacco Use  Smoking  status: Former   Types: Cigarettes Vaping Use  Vaping status: Unknown Substance and Sexual Activity  Alcohol use: Never  Drug use: Never  Social Drivers of Health  Food Insecurity: Low Risk  (05/19/2023)  Received from [facility]  Hunger Vital Sign   Within the past 12 months, you worried that your food would run out before you got money to buy more: Never true   Within the past 12 months,  the food you bought just didn't last and you didn't have money to get more. : Never true Transportation Needs: No Transportation Needs (05/19/2023)  Received from [employer]   In the past 12 months, has lack of reliable transportation kept you from medical appointments, meetings, work or from getting things needed for daily living? : No  Received from [employer]  Social Network Housing Stability: Unknown (07/07/2023)  Housing Stability Vital Sign   Homeless in the Last Year: No   Objective:   Vitals:  07/07/23 1336 07/07/23 1337 BP: (!) 140/78  Pulse: 64  Temp: 36.8 C (98.3 F)  SpO2: 97%  Weight: (!) 107.6 kg (237 lb 3.2 oz)  Height: 175.3 cm (5\' 9" )  PainSc:  0-No pain   Body mass index is 35.03 kg/m. Physical Exam Constitutional:      Appearance: Normal appearance.  HENT:     Head: Normocephalic and atraumatic.     Nose: Nose normal. No congestion.     Mouth/Throat:     Mouth: Mucous membranes are moist.     Pharynx: Oropharynx is clear.   Eyes:     Pupils: Pupils are equal, round, and reactive to light.    Cardiovascular:     Rate and Rhythm: Normal rate and regular rhythm.     Pulses: Normal pulses.     Heart sounds: Normal heart sounds. No murmur heard.    No friction rub. No gallop.  Pulmonary:     Effort: Pulmonary effort is normal. No respiratory distress.     Breath sounds: Normal breath sounds. No stridor. No wheezing, rhonchi or rales.  Abdominal:     General: Abdomen is flat.     Hernia: A hernia is present. Hernia is present in the left inguinal area. There is no hernia in the right inguinal area.   Musculoskeletal:        General: Normal range of motion.     Cervical back: Normal range of motion.   Skin:    General: Skin is warm and dry.   Neurological:     General: No focal deficit present.     Mental Status: He is alert and oriented to person, place, and time.   Psychiatric:        Mood and Affect: Mood normal.         Thought Content: Thought content normal.       Assessment and Plan: Diagnoses and all orders for this visit:  Recurrent left inguinal hernia    [name] is a 63 y.o. male   1.  We will proceed to the OR for a open left inguinal hernia repair with mesh. 2. All risks and benefits were discussed with the patient, to generally include infection, bleeding, damage to surrounding structures, acute and chronic nerve pain, and recurrence. Alternatives were offered and described.  All questions were answered and the patient voiced understanding of the procedure and wishes to proceed at this point.       No follow-ups on file.  Shalamar Crays  Melton Squires, MD, [facility], [place] [facility]

## 2023-07-10 NOTE — Pre-Procedure Instructions (Signed)
 Surgical Instructions   Your procedure is scheduled on July 17, 2023. Report to [facility] Main Entrance A at 11:00 A.M., then check in with the Admitting office. Any questions or running late day of surgery: call [phone]  Questions prior to your surgery date: call [phone], Monday-Friday, 8am-4pm. If you experience any cold or flu symptoms such as cough, fever, chills, shortness of breath, etc. between now and your scheduled surgery, please notify us  at the above number.     Remember:  Do not eat after midnight the night before your surgery   You may drink clear liquids until 10:00 AM the morning of your surgery.   Clear liquids allowed are: Water, Non-Citrus Juices (without pulp), Carbonated Beverages, Clear Tea (no milk, honey, etc.), Black Coffee Only (NO MILK, CREAM OR POWDERED CREAMER of any kind), and Gatorade.  Patient Instructions  The night before surgery:  No food after midnight. ONLY clear liquids after midnight  The day of surgery (if you do NOT have diabetes):  Drink ONE (1) Pre-Surgery Clear Ensure by 10:00 AM the morning of surgery. Drink in one sitting. Do not sip.  This drink was given to you during your hospital  pre-op appointment visit.  Nothing else to drink after completing the  Pre-Surgery Clear Ensure.         If you have questions, please contact your surgeon's office.   Take these medicines the morning of surgery with A SIP OF WATER: DULoxetine (CYMBALTA)  FLUoxetine (PROZAC)  Oxycodone   pregabalin  (LYRICA )    One week prior to surgery, STOP taking any Aspirin (unless otherwise instructed by your surgeon) Aleve, Naproxen, Ibuprofen, Motrin, Advil, Goody's, BC's, all herbal medications, fish oil, and non-prescription vitamins.                     Do NOT Smoke (Tobacco/Vaping) for 24 hours prior to your procedure.  If you use a CPAP at night, you may bring your mask/headgear for your overnight stay.   You will be asked to remove any  contacts, glasses, piercing's, hearing aid's, dentures/partials prior to surgery. Please bring cases for these items if needed.    Patients discharged the day of surgery will not be allowed to drive home, and someone needs to stay with them for 24 hours.  SURGICAL WAITING ROOM VISITATION Patients may have no more than 2 support people in the waiting area - these visitors may rotate.   Pre-op nurse will coordinate an appropriate time for 1 ADULT support person, who may not rotate, to accompany patient in pre-op.  Children under the age of 44 must have an adult with them who is not the patient and must remain in the main waiting area with an adult.  If the patient needs to stay at the hospital during part of their recovery, the visitor guidelines for inpatient rooms apply.  Please refer to the [facility] website for the visitor guidelines for any additional information.   If you received a COVID test during your pre-op visit  it is requested that you wear a mask when out in public, stay away from anyone that may not be feeling well and notify your surgeon if you develop symptoms. If you have been in contact with anyone that has tested positive in the last 10 days please notify you surgeon.      Pre-operative CHG Bathing Instructions   You can play a key role in reducing the risk of infection after surgery. Your skin needs  to be as free of germs as possible. You can reduce the number of germs on your skin by washing with CHG (chlorhexidine  gluconate) soap before surgery. CHG is an antiseptic soap that kills germs and continues to kill germs even after washing.   DO NOT use if you have an allergy to chlorhexidine /CHG or antibacterial soaps. If your skin becomes reddened or irritated, stop using the CHG and notify one of our RNs at [phone].              TAKE A SHOWER THE NIGHT BEFORE SURGERY AND THE DAY OF SURGERY    Please keep in mind the following:  DO NOT shave, including legs and  underarms, 48 hours prior to surgery.   You may shave your face before/day of surgery.  Place clean sheets on your bed the night before surgery Use a clean washcloth (not used since being washed) for each shower. DO NOT sleep with pet's night before surgery.  CHG Shower Instructions:  Wash your face and private area with normal soap. If you choose to wash your hair, wash first with your normal shampoo.  After you use shampoo/soap, rinse your hair and body thoroughly to remove shampoo/soap residue.  Turn the water OFF and apply half the bottle of CHG soap to a CLEAN washcloth.  Apply CHG soap ONLY FROM YOUR NECK DOWN TO YOUR TOES (washing for 3-5 minutes)  DO NOT use CHG soap on face, private areas, open wounds, or sores.  Pay special attention to the area where your surgery is being performed.  If you are having back surgery, having someone wash your back for you may be helpful. Wait 2 minutes after CHG soap is applied, then you may rinse off the CHG soap.  Pat dry with a clean towel  Put on clean pajamas    Additional instructions for the day of surgery: DO NOT APPLY any lotions, deodorants, cologne, or perfumes.   Do not wear jewelry or makeup Do not wear nail polish, gel polish, artificial nails, or any other type of covering on natural nails (fingers and toes) Do not bring valuables to the hospital. [facility] is not responsible for valuables/personal belongings. Put on clean/comfortable clothes.  Please brush your teeth.  Ask your nurse before applying any prescription medications to the skin.

## 2023-07-13 ENCOUNTER — Encounter (HOSPITAL_COMMUNITY): Payer: Self-pay

## 2023-07-13 ENCOUNTER — Other Ambulatory Visit: Payer: Self-pay

## 2023-07-13 ENCOUNTER — Encounter (HOSPITAL_COMMUNITY)
Admission: RE | Admit: 2023-07-13 | Discharge: 2023-07-13 | Disposition: A | Discharge status: Home / Self Care | Source: Ambulatory Visit | Attending: General Surgery | Admitting: General Surgery

## 2023-07-13 VITALS — BP 112/73 | HR 69 | Temp 98.2°F | Resp 18 | Ht 69.0 in | Wt 236.7 lb

## 2023-07-13 DIAGNOSIS — Z01812 Encounter for preprocedural laboratory examination: Secondary | ICD-10-CM | POA: Diagnosis present

## 2023-07-13 DIAGNOSIS — Z01818 Encounter for other preprocedural examination: Secondary | ICD-10-CM | POA: Insufficient documentation

## 2023-07-13 DIAGNOSIS — R9431 Abnormal electrocardiogram [ECG] [EKG]: Secondary | ICD-10-CM | POA: Diagnosis not present

## 2023-07-13 DIAGNOSIS — Z01810 Encounter for preprocedural cardiovascular examination: Secondary | ICD-10-CM | POA: Diagnosis present

## 2023-07-13 DIAGNOSIS — I2510 Atherosclerotic heart disease of native coronary artery without angina pectoris: Secondary | ICD-10-CM | POA: Diagnosis not present

## 2023-07-13 LAB — CBC
HCT: 46.6 % (ref 39.0–52.0)
Hemoglobin: 15.5 g/dL (ref 13.0–17.0)
MCH: 31.9 pg (ref 26.0–34.0)
MCHC: 33.3 g/dL (ref 30.0–36.0)
MCV: 95.9 fL (ref 80.0–100.0)
Platelets: 240 10*3/uL (ref 150–400)
RBC: 4.86 MIL/uL (ref 4.22–5.81)
RDW: 12.5 % (ref 11.5–15.5)
WBC: 9.8 10*3/uL (ref 4.0–10.5)
nRBC: 0 % (ref 0.0–0.2)

## 2023-07-13 LAB — BASIC METABOLIC PANEL WITH GFR
Anion gap: 6 (ref 5–15)
BUN: 10 mg/dL (ref 8–23)
CO2: 30 mmol/L (ref 22–32)
Calcium: 9.3 mg/dL (ref 8.9–10.3)
Chloride: 101 mmol/L (ref 98–111)
Creatinine, Ser: 0.86 mg/dL (ref 0.61–1.24)
GFR, Estimated: >60 mL/min (ref >60)
Glucose, Bld: 106 mg/dL — ABNORMAL HIGH (ref 70–99)
Potassium: 4.9 mmol/L (ref 3.5–5.1)
Sodium: 137 mmol/L (ref 135–145)
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
## 2023-07-17 ENCOUNTER — Other Ambulatory Visit: Payer: Self-pay

## 2023-07-17 ENCOUNTER — Encounter (HOSPITAL_COMMUNITY)
Admission: RE | Disposition: A | Discharge status: Home / Self Care | Payer: Self-pay | Source: Home / Self Care | Attending: General Surgery

## 2023-07-17 ENCOUNTER — Ambulatory Visit (HOSPITAL_COMMUNITY): Admitting: Anesthesiology

## 2023-07-17 ENCOUNTER — Ambulatory Visit (HOSPITAL_COMMUNITY): Payer: Self-pay | Admitting: Physician Assistant

## 2023-07-17 ENCOUNTER — Ambulatory Visit (HOSPITAL_COMMUNITY)
Admission: RE | Admit: 2023-07-17 | Discharge: 2023-07-17 | Disposition: A | Discharge status: Home / Self Care | Source: Home / Self Care | Attending: General Surgery | Admitting: General Surgery

## 2023-07-17 DIAGNOSIS — K4090 Unilateral inguinal hernia, without obstruction or gangrene, not specified as recurrent: Secondary | ICD-10-CM

## 2023-07-17 DIAGNOSIS — I4891 Unspecified atrial fibrillation: Secondary | ICD-10-CM

## 2023-07-17 DIAGNOSIS — K4091 Unilateral inguinal hernia, without obstruction or gangrene, recurrent: Secondary | ICD-10-CM | POA: Diagnosis present

## 2023-07-17 DIAGNOSIS — D176 Benign lipomatous neoplasm of spermatic cord: Secondary | ICD-10-CM | POA: Insufficient documentation

## 2023-07-17 HISTORY — PX: INGUINAL HERNIA REPAIR: SHX194

## 2023-07-17 SURGERY — REPAIR, HERNIA, INGUINAL, ADULT
Anesthesia: General | Site: Abdomen | Laterality: Left

## 2023-07-17 MED ORDER — PROPOFOL 10 MG/ML IV BOLUS
INTRAVENOUS | Status: DC | PRN
  Administered 2023-07-17: 150 mg via INTRAVENOUS

## 2023-07-17 MED ORDER — MIDAZOLAM HCL 2 MG/2ML IJ SOLN
INTRAMUSCULAR | Status: AC
  Filled 2023-07-17: qty 2

## 2023-07-17 MED ORDER — BUPIVACAINE-EPINEPHRINE 0.25% -1:200000 IJ SOLN
INTRAMUSCULAR | Status: DC | PRN
  Administered 2023-07-17: 12 mL

## 2023-07-17 MED ORDER — PHENYLEPHRINE HCL-NACL 20-0.9 MG/250ML-% IV SOLN
INTRAVENOUS | Status: DC | PRN
  Administered 2023-07-17: 35 ug/min via INTRAVENOUS

## 2023-07-17 MED ORDER — DROPERIDOL 2.5 MG/ML IJ SOLN: 0.6250 mg | Freq: Once | INTRAMUSCULAR | Status: DC | PRN

## 2023-07-17 MED ORDER — OXYCODONE HCL 5 MG PO TABS
ORAL_TABLET | ORAL | Status: AC
  Filled 2023-07-17: qty 1

## 2023-07-17 MED ORDER — ROCURONIUM BROMIDE 10 MG/ML (PF) SYRINGE
PREFILLED_SYRINGE | INTRAVENOUS | Status: DC | PRN
  Administered 2023-07-17: 60 mg via INTRAVENOUS

## 2023-07-17 MED ORDER — FENTANYL CITRATE (PF) 100 MCG/2ML IJ SOLN
25.0000 ug | INTRAMUSCULAR | Status: DC | PRN
  Administered 2023-07-17 (×2): 50 ug via INTRAVENOUS

## 2023-07-17 MED ORDER — CEFAZOLIN SODIUM-DEXTROSE 2-4 GM/100ML-% IV SOLN: 2.0000 g | INTRAVENOUS | Status: DC

## 2023-07-17 MED ORDER — SUGAMMADEX SODIUM 200 MG/2ML IV SOLN
INTRAVENOUS | Status: DC | PRN
  Administered 2023-07-17: 200 mg via INTRAVENOUS

## 2023-07-17 MED ORDER — LACTATED RINGERS IV SOLN: INTRAVENOUS | Status: DC

## 2023-07-17 MED ORDER — DEXAMETHASONE SODIUM PHOSPHATE 10 MG/ML IJ SOLN
INTRAMUSCULAR | Status: DC | PRN
  Administered 2023-07-17: 10 mg via INTRAVENOUS

## 2023-07-17 MED ORDER — LACTATED RINGERS IV SOLN: INTRAVENOUS | Status: DC | PRN

## 2023-07-17 MED ORDER — CHLORHEXIDINE GLUCONATE CLOTH 2 % EX PADS: 6.0000 | MEDICATED_PAD | Freq: Once | CUTANEOUS | Status: DC

## 2023-07-17 MED ORDER — CEFAZOLIN SODIUM-DEXTROSE 2-4 GM/100ML-% IV SOLN
INTRAVENOUS | Status: AC
  Filled 2023-07-17: qty 100

## 2023-07-17 MED ORDER — CHLORHEXIDINE GLUCONATE 0.12 % MT SOLN: 15.0000 mL | Freq: Once | OROMUCOSAL | Status: AC

## 2023-07-17 MED ORDER — MIDAZOLAM HCL 2 MG/2ML IJ SOLN
INTRAMUSCULAR | Status: DC | PRN
  Administered 2023-07-17: 2 mg via INTRAVENOUS

## 2023-07-17 MED ORDER — OXYCODONE HCL 5 MG PO TABS
5.0000 mg | ORAL_TABLET | Freq: Once | ORAL | Status: AC | PRN
  Administered 2023-07-17: 5 mg via ORAL

## 2023-07-17 MED ORDER — FENTANYL CITRATE (PF) 250 MCG/5ML IJ SOLN
INTRAMUSCULAR | Status: AC
  Filled 2023-07-17: qty 5

## 2023-07-17 MED ORDER — ORAL CARE MOUTH RINSE
15.0000 mL | Freq: Once | OROMUCOSAL | Status: AC
Start: 2023-07-17 — End: 2023-07-17

## 2023-07-17 MED ORDER — TRAMADOL HCL 50 MG PO TABS: 50.0000 mg | ORAL_TABLET | Freq: Four times a day (QID) | ORAL | 0 refills | Status: DC | PRN

## 2023-07-17 MED ORDER — OXYCODONE HCL 5 MG/5ML PO SOLN: 5.0000 mg | Freq: Once | ORAL | Status: AC | PRN

## 2023-07-17 MED ORDER — ENSURE PRE-SURGERY PO LIQD: 296.0000 mL | Freq: Once | ORAL | Status: DC

## 2023-07-17 MED ORDER — LIDOCAINE 2% (20 MG/ML) 5 ML SYRINGE
INTRAMUSCULAR | Status: DC | PRN
  Administered 2023-07-17: 100 mg via INTRAVENOUS

## 2023-07-17 MED ORDER — ACETAMINOPHEN 500 MG PO TABS
ORAL_TABLET | ORAL | Status: AC
  Administered 2023-07-17: 1000 mg via ORAL
  Filled 2023-07-17: qty 2

## 2023-07-17 MED ORDER — ACETAMINOPHEN 10 MG/ML IV SOLN: 1000.0000 mg | Freq: Once | INTRAVENOUS | Status: DC | PRN

## 2023-07-17 MED ORDER — ACETAMINOPHEN 500 MG PO TABS: 1000.0000 mg | ORAL_TABLET | ORAL | Status: AC

## 2023-07-17 MED ORDER — ACETAMINOPHEN 500 MG PO TABS: 1000.0000 mg | ORAL_TABLET | Freq: Once | ORAL | Status: DC

## 2023-07-17 MED ORDER — FENTANYL CITRATE (PF) 100 MCG/2ML IJ SOLN
INTRAMUSCULAR | Status: AC
  Filled 2023-07-17: qty 2

## 2023-07-17 MED ORDER — FENTANYL CITRATE (PF) 250 MCG/5ML IJ SOLN
INTRAMUSCULAR | Status: DC | PRN
  Administered 2023-07-17: 50 ug via INTRAVENOUS
  Administered 2023-07-17: 100 ug via INTRAVENOUS

## 2023-07-17 MED ORDER — BUPIVACAINE-EPINEPHRINE (PF) 0.25% -1:200000 IJ SOLN
INTRAMUSCULAR | Status: AC
  Filled 2023-07-17: qty 30

## 2023-07-17 MED ORDER — DEXMEDETOMIDINE HCL IN NACL 80 MCG/20ML IV SOLN
INTRAVENOUS | Status: DC | PRN
  Administered 2023-07-17: 8 ug via INTRAVENOUS

## 2023-07-17 MED ORDER — CHLORHEXIDINE GLUCONATE 0.12 % MT SOLN
OROMUCOSAL | Status: AC
  Administered 2023-07-17: 15 mL via OROMUCOSAL
  Filled 2023-07-17: qty 15

## 2023-07-17 MED ORDER — PHENYLEPHRINE 80 MCG/ML (10ML) SYRINGE FOR IV PUSH (FOR BLOOD PRESSURE SUPPORT)
PREFILLED_SYRINGE | INTRAVENOUS | Status: DC | PRN
  Administered 2023-07-17: 80 ug via INTRAVENOUS
  Administered 2023-07-17: 240 ug via INTRAVENOUS
  Administered 2023-07-17 (×2): 80 ug via INTRAVENOUS

## 2023-07-17 MED ORDER — 0.9 % SODIUM CHLORIDE (POUR BTL) OPTIME
TOPICAL | Status: DC | PRN
  Administered 2023-07-17: 1000 mL

## 2023-07-17 MED ORDER — ONDANSETRON HCL 4 MG/2ML IJ SOLN
INTRAMUSCULAR | Status: DC | PRN
  Administered 2023-07-17: 4 mg via INTRAVENOUS

## 2023-07-17 SURGICAL SUPPLY — 32 items
BAG COUNTER SPONGE SURGICOUNT (BAG) ×1 IMPLANT
BLADE CLIPPER SURG (BLADE) IMPLANT
CANISTER SUCTION 3000ML PPV (SUCTIONS) IMPLANT
CHLORAPREP W/TINT 26 (MISCELLANEOUS) ×1 IMPLANT
COVER SURGICAL LIGHT HANDLE (MISCELLANEOUS) ×1 IMPLANT
DERMABOND ADVANCED .7 DNX12 (GAUZE/BANDAGES/DRESSINGS) ×1 IMPLANT
DRAIN PENROSE .5X12 LATEX STL (DRAIN) IMPLANT
DRAPE LAPAROSCOPIC ABDOMINAL (DRAPES) ×1 IMPLANT
ELECTRODE REM PT RTRN 9FT ADLT (ELECTROSURGICAL) ×1 IMPLANT
GAUZE 4X4 16PLY ~~LOC~~+RFID DBL (SPONGE) ×1 IMPLANT
GLOVE BIO SURGEON STRL SZ7.5 (GLOVE) ×2 IMPLANT
GLOVE BIOGEL PI IND STRL 8 (GLOVE) ×1 IMPLANT
GOWN STRL REUS W/ TWL LRG LVL3 (GOWN DISPOSABLE) ×1 IMPLANT
GOWN STRL REUS W/ TWL XL LVL3 (GOWN DISPOSABLE) ×1 IMPLANT
KIT BASIN OR (CUSTOM PROCEDURE TRAY) ×1 IMPLANT
KIT TURNOVER KIT B (KITS) ×1 IMPLANT
MESH PARIETEX PROGRIP LEFT (Mesh General) IMPLANT
NDL HYPO 25GX1X1/2 BEV (NEEDLE) ×1 IMPLANT
NEEDLE HYPO 25GX1X1/2 BEV (NEEDLE) ×1 IMPLANT
NS IRRIG 1000ML POUR BTL (IV SOLUTION) ×1 IMPLANT
PACK GENERAL/GYN (CUSTOM PROCEDURE TRAY) ×1 IMPLANT
PAD ARMBOARD POSITIONER FOAM (MISCELLANEOUS) ×2 IMPLANT
SUT MNCRL AB 4-0 PS2 18 (SUTURE) ×1 IMPLANT
SUT PROLENE 2 0 SH DA (SUTURE) ×1 IMPLANT
SUT SILK 0 TIES 10X30 (SUTURE) ×1 IMPLANT
SUT VIC AB 2-0 SH 27X BRD (SUTURE) ×1 IMPLANT
SUT VIC AB 3-0 SH 27XBRD (SUTURE) ×1 IMPLANT
SUT VICRYL AB 2 0 TIES (SUTURE) ×1 IMPLANT
SYR CONTROL 10ML LL (SYRINGE) ×1 IMPLANT
TOWEL GREEN STERILE (TOWEL DISPOSABLE) ×1 IMPLANT
TOWEL GREEN STERILE FF (TOWEL DISPOSABLE) ×1 IMPLANT
YANKAUER SUCT BULB TIP NO VENT (SUCTIONS) IMPLANT

## 2023-07-17 NOTE — Anesthesia Procedure Notes (Signed)
 Procedure Name: Intubation Date/Time: 07/17/2023 12:13 PM  Performed by: Hebert Littler, CRNAPre-anesthesia Checklist: Patient identified, Emergency Drugs available, Suction available and Timeout performed Patient Re-evaluated:Patient Re-evaluated prior to induction Oxygen Delivery Method: Circle system utilized Preoxygenation: Pre-oxygenation with 100% oxygen Induction Type: IV induction Ventilation: Mask ventilation without difficulty and Oral airway inserted - appropriate to patient size Laryngoscope Size: Mac and 3 Grade View: Grade I Tube type: Oral Tube size: 7.0 mm Number of attempts: 1 Placement Confirmation: positive ETCO2, CO2 detector and breath sounds checked- equal and bilateral Secured at: 23 cm Tube secured with: Tape

## 2023-07-17 NOTE — Anesthesia Preprocedure Evaluation (Signed)
 Anesthesia Evaluation  Patient identified by MRN, date of birth, ID band Patient awake    Reviewed: Allergy & Precautions, NPO status , Patient's Chart, lab work & pertinent test results  History of Anesthesia Complications Negative for: history of anesthetic complications  Airway Mallampati: II  TM Distance: >3 FB Neck ROM: Full    Dental no notable dental hx. (+) Dental Advisory Given   Pulmonary former smoker   breath sounds clear to auscultation       Cardiovascular + dysrhythmias Atrial Fibrillation  Rhythm:Regular Rate:Normal     Neuro/Psych neg Seizures PSYCHIATRIC DISORDERS  Depression     Neuromuscular disease    GI/Hepatic negative GI ROS, Neg liver ROS,,,  Endo/Other  negative endocrine ROS    Renal/GU negative Renal ROS     Musculoskeletal   Abdominal   Peds  Hematology  (+) Blood dyscrasia, anemia   Anesthesia Other Findings   Reproductive/Obstetrics                              Lab Results  Component Value Date   WBC 9.8 07/13/2023   HGB 15.5 07/13/2023   HCT 46.6 07/13/2023   MCV 95.9 07/13/2023   PLT 240 07/13/2023   Lab Results  Component Value Date   CREATININE 0.86 07/13/2023   BUN 10 07/13/2023   NA 137 07/13/2023   K 4.9 07/13/2023   CL 101 07/13/2023   CO2 30 07/13/2023    Anesthesia Physical Anesthesia Plan  ASA: 3  Anesthesia Plan: General   Post-op Pain Management: Tylenol  PO (pre-op)*   Induction: Intravenous  PONV Risk Score and Plan: 3 and Dexamethasone , Ondansetron , Midazolam  and Treatment may vary due to age or medical condition  Airway Management Planned: Oral ETT  Additional Equipment: None  Intra-op Plan:   Post-operative Plan: Extubation in OR  Informed Consent: I have reviewed the patients History and Physical, chart, labs and discussed the procedure including the risks, benefits and alternatives for the proposed anesthesia  with the patient or authorized representative who has indicated his/her understanding and acceptance.     Dental advisory given  Plan Discussed with: CRNA  Anesthesia Plan Comments:          Anesthesia Quick Evaluation

## 2023-07-17 NOTE — Op Note (Signed)
 07/17/2023  12:57 PM  PATIENT:  [name]  63 y.o. male  PRE-OPERATIVE DIAGNOSIS:  RECURRENT LEFT INGUINAL HERNIA  POST-OPERATIVE DIAGNOSIS:  RECURRENT LEFT INGUINAL HERNIA, Indirect, cord lipoma  PROCEDURE:  Procedure(s) with comments: REPAIR, HERNIA, INGUINAL, ADULT, WITH MESH (Left) - LATERALITY: LEFT  SURGEON:  Surgeons and Role:    Shela Derby, MD - Primary  ASSISTANTS: Woodroe Hazel, RNFA   ANESTHESIA:   local and general  EBL:  minimal   BLOOD ADMINISTERED:none  DRAINS: none   LOCAL MEDICATIONS USED:  BUPIVICAINE   SPECIMEN:  No Specimen  DISPOSITION OF SPECIMEN:  N/A  COUNTS:  YES  TOURNIQUET:  * No tourniquets in log *  DICTATION: .Dragon Dictation  Details of the procedure: The patient was taken back to the operating room. The patient was placed in supine position with bilateral SCDs in place. The patient was prepped and draped in the usual sterile fashion.  After appropriate anitbiotics were confirmed, a time-out was confirmed and all facts were verified.  Quarter percent Marcaine  was used to infiltrate the area of the incision and an ilioinguinal nerve block was also placed.   A 5 cm incision was made just 1 cm superior to the inguinal ligament. Bovie cautery was used to maintain hemostasis dissection is carried down to the external oblique.  A standard incision was made laterally, and the external oblique was bluntly dissected away from the surrounding tissue with Metzenbaum scissors. The external oblique was elevated in the spermatic cord was bluntly dissected away from the surrounding tissue.  The ilioinguinal nerve was not identified.  There is significant amount of scar tissue medially towards the pubic tubercle.  The spermatic cord and the hernia were then bluntly dissected away from the internal bleak muscle, And a Penrose was placed around the hernia sac in the spermatic cord. The vas deferens was identified and protected at all portions of  the case. Dissection of the cremasterics took place with Bovie cautery. One the hernia sac was dissected away from the surrrounding cremesteric tissue it was apparent this was preperitoneal fat.  There is no hernia sac/peritoneum that could be dissected.  At this time a left-sided Progrip mesh was then anchored to the pubic tubercle with a 2-0 Prolene.  It was anchored to the shelving edge of the external oblique x 1 and the conjoint tendon cephalad x 1.  The wrap around of the mesh was sutured to the conjoint tendon as well.  The new internal ring did not strangulate the spermatic cord.   The tail was then tucked under the external oblique. At this time the area was irrigated out with sterile saline.    The external oblique was reapproximated using a 2-0 Vicryl in a running fashion. Scarpa's fascia was then reapproximated using a 3-0 Vicryl running fashion. The skin was then reapproximated with 4 Monocryl in a subcuticular fashion. The skin was then dressed with Dermabond.  The patient was taken to the recovery room in stable condition.      PLAN OF CARE: Discharge to home after PACU  PATIENT DISPOSITION:  PACU - hemodynamically stable.   Delay start of Pharmacological VTE agent (>24hrs) due to surgical blood loss or risk of bleeding: not applicable

## 2023-07-17 NOTE — Transfer of Care (Signed)
 Immediate Anesthesia Transfer of Care Note  Patient: [name]  Procedure(s) Performed: REPAIR, HERNIA, INGUINAL, ADULT, WITH MESH (Left: Abdomen)  Patient Location: PACU  Anesthesia Type:General  Level of Consciousness: awake, alert , oriented, and patient cooperative  Airway & Oxygen Therapy: Patient Spontanous Breathing and Patient connected to face mask oxygen  Post-op Assessment: Report given to RN, Post -op Vital signs reviewed and stable, Patient moving all extremities, Patient moving all extremities X 4, and Patient able to stick tongue midline  Post vital signs: Reviewed and stable  Last Vitals:  Vitals Value Taken Time  BP 156/73 07/17/23 13:15  Temp    Pulse 89 07/17/23 13:18  Resp 20 07/17/23 13:18  SpO2 98 % 07/17/23 13:18  Vitals shown include unfiled device data.  Last Pain:  Vitals:   07/17/23 1130  TempSrc:   PainSc: 0-No pain         Complications: No notable events documented.

## 2023-07-17 NOTE — Discharge Instructions (Signed)

## 2023-07-17 NOTE — Anesthesia Postprocedure Evaluation (Signed)
 Anesthesia Post Note  Patient: Kalieb L Boesen  Procedure(s) Performed: REPAIR, HERNIA, INGUINAL, ADULT, WITH MESH (Left: Abdomen)     Patient location during evaluation: PACU Anesthesia Type: General Level of consciousness: awake and alert Pain management: pain level controlled Vital Signs Assessment: post-procedure vital signs reviewed and stable Respiratory status: spontaneous breathing, nonlabored ventilation, respiratory function stable and patient connected to nasal cannula oxygen Cardiovascular status: blood pressure returned to baseline and stable Postop Assessment: no apparent nausea or vomiting Anesthetic complications: no   No notable events documented.  Last Vitals:  Vitals:   07/17/23 1400 07/17/23 1415  BP: 124/81 118/75  Pulse: 69 65  Resp: 14 18  Temp:  36.7 C  SpO2: 93% 92%    Last Pain:  Vitals:   07/17/23 1415  TempSrc:   PainSc: 4                  Lethaniel Rave

## 2023-07-17 NOTE — Interval H&P Note (Signed)
 History and Physical Interval Note:  07/17/2023 11:41 AM  [name]  has presented today for surgery, with the diagnosis of RECURRENT LEFT INGUINAL HERNIA.  The various methods of treatment have been discussed with the patient and family. After consideration of risks, benefits and other options for treatment, the patient has consented to  Procedure(s) with comments: REPAIR, HERNIA, INGUINAL, ADULT (Left) - LATERALITY: LEFT as a surgical intervention.  The patient's history has been reviewed, patient examined, no change in status, stable for surgery.  I have reviewed the patient's chart and labs.  Questions were answered to the patient's satisfaction.     Chantell Kunkler

## 2023-07-20 ENCOUNTER — Encounter (HOSPITAL_COMMUNITY): Payer: Self-pay | Admitting: General Surgery

## 2023-08-19 ENCOUNTER — Telehealth: Payer: Self-pay | Admitting: Physical Medicine and Rehabilitation

## 2023-08-19 DIAGNOSIS — M5416 Radiculopathy, lumbar region: Secondary | ICD-10-CM

## 2023-08-19 NOTE — Telephone Encounter (Signed)
 Pt request an appointment for an injection

## 2023-08-27 ENCOUNTER — Other Ambulatory Visit: Payer: Self-pay

## 2023-08-27 ENCOUNTER — Ambulatory Visit (INDEPENDENT_AMBULATORY_CARE_PROVIDER_SITE_OTHER): Admitting: Physical Medicine and Rehabilitation

## 2023-08-27 VITALS — BP 135/71 | HR 71

## 2023-08-27 DIAGNOSIS — M5416 Radiculopathy, lumbar region: Secondary | ICD-10-CM

## 2023-08-27 MED ORDER — METHYLPREDNISOLONE ACETATE 40 MG/ML IJ SUSP
40.0000 mg | Freq: Once | INTRAMUSCULAR | Status: AC
  Administered 2023-08-27: 40 mg

## 2023-08-27 NOTE — Patient Instructions (Signed)

## 2023-08-27 NOTE — Progress Notes (Signed)
 [name] - 63 y.o. male MRN [id number]  Date of birth: 09-Jan-1961  Office Visit Note: Visit Date: 08/27/2023 PCP: Tanda Prentice DEL, MD Referred by: Tanda Prentice DEL, MD  Subjective: Chief Complaint  Patient presents with   Lower Back - Pain   HPI:  [name] is a 63 y.o. male who comes in today for planned repeat Right L4-5  Lumbar Interlaminar epidural steroid injection with fluoroscopic guidance.  The patient has failed conservative care including home exercise, medications, time and activity modification.  This injection will be diagnostic and hopefully therapeutic.  Please see requesting physician notes for further details and justification. Patient received more than 50% pain relief from prior injection.   Referring: Duwaine Pouch, FNP   ROS Otherwise per HPI.  Assessment & Plan: Visit Diagnoses:    ICD-10-CM   1. Lumbar radiculopathy  M54.16 XR C-ARM NO REPORT    Epidural Steroid injection    methylPREDNISolone  acetate (DEPO-MEDROL ) injection 40 mg      Plan: No additional findings.   Meds & Orders:  Meds ordered this encounter  Medications   methylPREDNISolone  acetate (DEPO-MEDROL ) injection 40 mg    Orders Placed This Encounter  Procedures   XR C-ARM NO REPORT   Epidural Steroid injection    Follow-up: Return for visit to requesting provider as needed.   Procedures: No procedures performed  Lumbar Epidural Steroid Injection - Interlaminar Approach with Fluoroscopic Guidance  Patient: [name]      Date of Birth: [birth year] MRN: [id number] PCP: Tanda Prentice DEL, MD      Visit Date: 08/27/2023   Universal Protocol:     Consent Given By: the patient  Position: PRONE  Additional Comments: Vital signs were monitored before and after the procedure. Patient was prepped and draped in the usual sterile fashion. The correct patient, procedure, and site was verified.   Injection Procedure Details:   Procedure diagnoses: Lumbar radiculopathy  [M54.16]   Meds Administered:  Meds ordered this encounter  Medications   methylPREDNISolone  acetate (DEPO-MEDROL ) injection 40 mg     Laterality: Right  Location/Site:  L4-5  Needle: 4.5 in., 20 ga. Tuohy  Needle Placement: Paramedian epidural  Findings:   -Comments: Excellent flow of contrast into the epidural space.  Procedure Details: Using a paramedian approach from the side mentioned above, the region overlying the inferior lamina was localized under fluoroscopic visualization and the soft tissues overlying this structure were infiltrated with 4 ml. of 1% Lidocaine  without Epinephrine . The Tuohy needle was inserted into the epidural space using a paramedian approach.   The epidural space was localized using loss of resistance along with counter oblique bi-planar fluoroscopic views.  After negative aspirate for air, blood, and CSF, a 2 ml. volume of Isovue-250 was injected into the epidural space and the flow of contrast was observed. Radiographs were obtained for documentation purposes.    The injectate was administered into the level noted above.   Additional Comments:  The patient tolerated the procedure well Dressing: 2 x 2 sterile gauze and Band-Aid    Post-procedure details: Patient was observed during the procedure. Post-procedure instructions were reviewed.  Patient left the clinic in stable condition.   Clinical History: MRI LUMBAR SPINE WITHOUT CONTRAST     TECHNIQUE:  Multiplanar, multisequence MR imaging of the lumbar spine was  performed. No intravenous contrast was administered.     COMPARISON:  Lumbar spine radiographs 02/19/2021     FINDINGS:  Segmentation:  Standard.  Alignment:  Normal.     Vertebrae: No fracture or suspicious marrow lesion. Prominent  bilateral facet edema at L4-5.     Conus medullaris and cauda equina: Conus extends to the T12 level.  Conus and cauda equina appear normal.     Paraspinal and other soft tissues:  Unremarkable.     Disc levels:     T12-L1 and L1-2: Negative.     L2-3: Mild disc desiccation. Mild disc bulging and mild facet  hypertrophy without significant stenosis.     L3-4: Mild disc desiccation. Moderate facet hypertrophy and at most  minimal disc bulging without stenosis.     L4-5: Mild disc desiccation. Disc bulging eccentric to the right and  severe facet and ligamentum flavum hypertrophy result in mild spinal  stenosis, mild-to-moderate bilateral lateral recess stenosis, and  mild-to-moderate right neural foraminal stenosis. The L5 nerve roots  could be affected in the lateral recesses.     L5-S1: Normal disc.  Moderate facet hypertrophy without stenosis.     IMPRESSION:  1. 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  2. Mild-to-moderate lateral recess and right neural foraminal  stenosis at L4-5.        Electronically Signed    By: Dasie Hamburg M.D.    On: 03/05/2021 15:15     Objective:  VS:  HT:    WT:   BMI:     BP:135/71  HR:71bpm  TEMP: ( )  RESP:  Physical Exam Vitals and nursing note reviewed.  Constitutional:      General: He is not in acute distress.    Appearance: Normal appearance. He is not ill-appearing.  HENT:     Head: Normocephalic and atraumatic.     Right Ear: External ear normal.     Left Ear: External ear normal.     Nose: No congestion.  Eyes:     Extraocular Movements: Extraocular movements intact.  Cardiovascular:     Rate and Rhythm: Normal rate.     Pulses: Normal pulses.  Pulmonary:     Effort: Pulmonary effort is normal. No respiratory distress.  Abdominal:     General: There is no distension.     Palpations: Abdomen is soft.  Musculoskeletal:        General: No tenderness or signs of injury.     Cervical back: Neck supple.     Right lower leg: No edema.     Left lower leg: No edema.     Comments: Patient has good distal strength without clonus.  Skin:    Findings: No erythema  or rash.  Neurological:     General: No focal deficit present.     Mental Status: He is alert and oriented to person, place, and time.     Sensory: No sensory deficit.     Motor: No weakness or abnormal muscle tone.     Coordination: Coordination normal.  Psychiatric:        Mood and Affect: Mood normal.        Behavior: Behavior normal.      Imaging: XR C-ARM NO REPORT Result Date: 08/27/2023 Please see Notes tab for imaging impression.

## 2023-08-27 NOTE — Progress Notes (Signed)
 Pain Scale   Average Pain 5 Patient advising he has chronic lower back pain radiating to right leg  and pain increases when standing and walking. Patient advising when he is sitting pain does decrease.        +Driver, -BT, -Dye Allergies.

## 2023-08-27 NOTE — Procedures (Signed)
 Lumbar Epidural Steroid Injection - Interlaminar Approach with Fluoroscopic Guidance  Patient: [name]      Date of Birth: 1960-10-01 MRN: [id number] PCP: Tanda Prentice DEL, MD      Visit Date: 08/27/2023   Universal Protocol:     Consent Given By: the patient  Position: PRONE  Additional Comments: Vital signs were monitored before and after the procedure. Patient was prepped and draped in the usual sterile fashion. The correct patient, procedure, and site was verified.   Injection Procedure Details:   Procedure diagnoses: Lumbar radiculopathy [M54.16]   Meds Administered:  Meds ordered this encounter  Medications   methylPREDNISolone  acetate (DEPO-MEDROL ) injection 40 mg     Laterality: Right  Location/Site:  L4-5  Needle: 4.5 in., 20 ga. Tuohy  Needle Placement: Paramedian epidural  Findings:   -Comments: Excellent flow of contrast into the epidural space.  Procedure Details: Using a paramedian approach from the side mentioned above, the region overlying the inferior lamina was localized under fluoroscopic visualization and the soft tissues overlying this structure were infiltrated with 4 ml. of 1% Lidocaine  without Epinephrine . The Tuohy needle was inserted into the epidural space using a paramedian approach.   The epidural space was localized using loss of resistance along with counter oblique bi-planar fluoroscopic views.  After negative aspirate for air, blood, and CSF, a 2 ml. volume of Isovue-250 was injected into the epidural space and the flow of contrast was observed. Radiographs were obtained for documentation purposes.    The injectate was administered into the level noted above.   Additional Comments:  The patient tolerated the procedure well Dressing: 2 x 2 sterile gauze and Band-Aid    Post-procedure details: Patient was observed during the procedure. Post-procedure instructions were reviewed.  Patient left the clinic in stable condition.

## 2023-11-23 ENCOUNTER — Telehealth: Payer: Self-pay | Admitting: Physical Medicine and Rehabilitation

## 2023-11-23 DIAGNOSIS — M5416 Radiculopathy, lumbar region: Secondary | ICD-10-CM

## 2023-11-23 NOTE — Telephone Encounter (Signed)
 Pt called requesting an injection. Please call pt at [phone]. Pt states he had called a few tines. Please call back.

## 2023-11-30 ENCOUNTER — Encounter: Payer: Self-pay | Admitting: Radiology

## 2023-12-14 ENCOUNTER — Other Ambulatory Visit: Payer: Self-pay

## 2023-12-14 ENCOUNTER — Ambulatory Visit (INDEPENDENT_AMBULATORY_CARE_PROVIDER_SITE_OTHER): Admitting: Physical Medicine and Rehabilitation

## 2023-12-14 VITALS — BP 157/78 | HR 76

## 2023-12-14 DIAGNOSIS — M5416 Radiculopathy, lumbar region: Secondary | ICD-10-CM | POA: Diagnosis not present

## 2023-12-14 MED ORDER — METHYLPREDNISOLONE ACETATE 40 MG/ML IJ SUSP
40.0000 mg | Freq: Once | INTRAMUSCULAR | Status: AC
  Administered 2023-12-14: 40 mg

## 2023-12-14 NOTE — Progress Notes (Signed)
 Pain Scale   Average Pain 7 Patient advising he has lower back pain radiating to right leg pain increases when walking and standing and decrease when sitting and resting.        +Driver, -BT, -Dye Allergies.

## 2023-12-20 NOTE — Progress Notes (Signed)
 [name] - 63 y.o. male MRN [id number]  Date of birth: 02-24-1960  Office Visit Note: Visit Date: 12/14/2023 PCP: Tanda Prentice DEL, MD Referred by: Tanda Prentice DEL, MD  Subjective: Chief Complaint  Patient presents with   Lower Back - Pain   HPI:  [name] is a 63 y.o. male who comes in today for planned repeat Right L4-5  Lumbar Interlaminar epidural steroid injection with fluoroscopic guidance.  The patient has failed conservative care including home exercise, medications, time and activity modification.  This injection will be diagnostic and hopefully therapeutic.  Please see requesting physician notes for further details and justification. Patient received more than 50% pain relief from prior injection.   Referring: Duwaine Pouch, FNP   ROS Otherwise per HPI.  Assessment & Plan: Visit Diagnoses:    ICD-10-CM   1. Lumbar radiculopathy  M54.16 XR C-ARM NO REPORT    Epidural Steroid injection    methylPREDNISolone  acetate (DEPO-MEDROL ) injection 40 mg      Plan: No additional findings.   Meds & Orders:  Meds ordered this encounter  Medications   methylPREDNISolone  acetate (DEPO-MEDROL ) injection 40 mg    Orders Placed This Encounter  Procedures   XR C-ARM NO REPORT   Epidural Steroid injection    Follow-up: Return for visit to requesting provider as needed.   Procedures: No procedures performed  Lumbar Epidural Steroid Injection - Interlaminar Approach with Fluoroscopic Guidance  Patient: [name]      Date of Birth: 1960-04-21 MRN: [id number] PCP: Tanda Prentice DEL, MD      Visit Date: 12/14/2023   Universal Protocol:     Consent Given By: the patient  Position: PRONE  Additional Comments: Vital signs were monitored before and after the procedure. Patient was prepped and draped in the usual sterile fashion. The correct patient, procedure, and site was verified.   Injection Procedure Details:   Procedure diagnoses: Lumbar radiculopathy  [M54.16]   Meds Administered:  Meds ordered this encounter  Medications   methylPREDNISolone  acetate (DEPO-MEDROL ) injection 40 mg     Laterality: Right  Location/Site:  L4-5  Needle: 4.5 in., 20 ga. Tuohy  Needle Placement: Paramedian epidural  Findings:   -Comments: Excellent flow of contrast into the epidural space.  Procedure Details: Using a paramedian approach from the side mentioned above, the region overlying the inferior lamina was localized under fluoroscopic visualization and the soft tissues overlying this structure were infiltrated with 4 ml. of 1% Lidocaine  without Epinephrine . The Tuohy needle was inserted into the epidural space using a paramedian approach.   The epidural space was localized using loss of resistance along with counter oblique bi-planar fluoroscopic views.  After negative aspirate for air, blood, and CSF, a 2 ml. volume of Isovue-250 was injected into the epidural space and the flow of contrast was observed. Radiographs were obtained for documentation purposes.    The injectate was administered into the level noted above.   Additional Comments:  The patient tolerated the procedure well Dressing: 2 x 2 sterile gauze and Band-Aid    Post-procedure details: Patient was observed during the procedure. Post-procedure instructions were reviewed.  Patient left the clinic in stable condition.   Clinical History: MRI LUMBAR SPINE WITHOUT CONTRAST     TECHNIQUE:  Multiplanar, multisequence MR imaging of the lumbar spine was  performed. No intravenous contrast was administered.     COMPARISON:  Lumbar spine radiographs 02/19/2021     FINDINGS:  Segmentation:  Standard.  Alignment:  Normal.     Vertebrae: No fracture or suspicious marrow lesion. Prominent  bilateral facet edema at L4-5.     Conus medullaris and cauda equina: Conus extends to the T12 level.  Conus and cauda equina appear normal.     Paraspinal and other soft tissues:  Unremarkable.     Disc levels:     T12-L1 and L1-2: Negative.     L2-3: Mild disc desiccation. Mild disc bulging and mild facet  hypertrophy without significant stenosis.     L3-4: Mild disc desiccation. Moderate facet hypertrophy and at most  minimal disc bulging without stenosis.     L4-5: Mild disc desiccation. Disc bulging eccentric to the right and  severe facet and ligamentum flavum hypertrophy result in mild spinal  stenosis, mild-to-moderate bilateral lateral recess stenosis, and  mild-to-moderate right neural foraminal stenosis. The L5 nerve roots  could be affected in the lateral recesses.     L5-S1: Normal disc.  Moderate facet hypertrophy without stenosis.     IMPRESSION:  1. 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  2. Mild-to-moderate lateral recess and right neural foraminal  stenosis at L4-5.        Electronically Signed    By: Dasie Hamburg M.D.    On: 03/05/2021 15:15     Objective:  VS:  HT:    WT:   BMI:     BP:(!) 157/78  HR:76bpm  TEMP: ( )  RESP:  Physical Exam Vitals and nursing note reviewed.  Constitutional:      General: He is not in acute distress.    Appearance: Normal appearance. He is not ill-appearing.  HENT:     Head: Normocephalic and atraumatic.     Right Ear: External ear normal.     Left Ear: External ear normal.     Nose: No congestion.  Eyes:     Extraocular Movements: Extraocular movements intact.  Cardiovascular:     Rate and Rhythm: Normal rate.     Pulses: Normal pulses.  Pulmonary:     Effort: Pulmonary effort is normal. No respiratory distress.  Abdominal:     General: There is no distension.     Palpations: Abdomen is soft.  Musculoskeletal:        General: No tenderness or signs of injury.     Cervical back: Neck supple.     Right lower leg: No edema.     Left lower leg: No edema.     Comments: Patient has good distal strength without clonus.  Skin:    Findings: No  erythema or rash.  Neurological:     General: No focal deficit present.     Mental Status: He is alert and oriented to person, place, and time.     Sensory: No sensory deficit.     Motor: No weakness or abnormal muscle tone.     Coordination: Coordination normal.  Psychiatric:        Mood and Affect: Mood normal.        Behavior: Behavior normal.      Imaging: No results found.

## 2023-12-20 NOTE — Procedures (Signed)
 Lumbar Epidural Steroid Injection - Interlaminar Approach with Fluoroscopic Guidance  Patient: [name]      Date of Birth: [birth year] MRN: [id number] PCP: Tanda Prentice DEL, MD      Visit Date: 12/14/2023   Universal Protocol:     Consent Given By: the patient  Position: PRONE  Additional Comments: Vital signs were monitored before and after the procedure. Patient was prepped and draped in the usual sterile fashion. The correct patient, procedure, and site was verified.   Injection Procedure Details:   Procedure diagnoses: Lumbar radiculopathy [M54.16]   Meds Administered:  Meds ordered this encounter  Medications   methylPREDNISolone  acetate (DEPO-MEDROL ) injection 40 mg     Laterality: Right  Location/Site:  L4-5  Needle: 4.5 in., 20 ga. Tuohy  Needle Placement: Paramedian epidural  Findings:   -Comments: Excellent flow of contrast into the epidural space.  Procedure Details: Using a paramedian approach from the side mentioned above, the region overlying the inferior lamina was localized under fluoroscopic visualization and the soft tissues overlying this structure were infiltrated with 4 ml. of 1% Lidocaine  without Epinephrine . The Tuohy needle was inserted into the epidural space using a paramedian approach.   The epidural space was localized using loss of resistance along with counter oblique bi-planar fluoroscopic views.  After negative aspirate for air, blood, and CSF, a 2 ml. volume of Isovue-250 was injected into the epidural space and the flow of contrast was observed. Radiographs were obtained for documentation purposes.    The injectate was administered into the level noted above.   Additional Comments:  The patient tolerated the procedure well Dressing: 2 x 2 sterile gauze and Band-Aid    Post-procedure details: Patient was observed during the procedure. Post-procedure instructions were reviewed.  Patient left the clinic in stable condition.

## 2024-01-07 ENCOUNTER — Other Ambulatory Visit (HOSPITAL_COMMUNITY): Payer: Self-pay | Admitting: Urology

## 2024-01-07 DIAGNOSIS — C61 Malignant neoplasm of prostate: Secondary | ICD-10-CM

## 2024-01-19 ENCOUNTER — Ambulatory Visit (HOSPITAL_COMMUNITY)
Admission: RE | Admit: 2024-01-19 | Discharge: 2024-01-19 | Disposition: A | Discharge status: Home / Self Care | Source: Ambulatory Visit | Attending: Urology | Admitting: Urology

## 2024-01-19 DIAGNOSIS — C61 Malignant neoplasm of prostate: Secondary | ICD-10-CM | POA: Diagnosis present

## 2024-01-19 MED ORDER — FLOTUFOLASTAT F 18 GALLIUM 296-5846 MBQ/ML IV SOLN
7.8000 | Freq: Once | INTRAVENOUS | Status: AC
  Administered 2024-01-19: 7.8 via INTRAVENOUS

## 2024-02-17 NOTE — Progress Notes (Addendum)
 GU Location of Tumor / Histology: Prostate Ca  If Prostate Cancer, Gleason Score is (4 + 3) and PSA is (5.01 on 11/20/2023)  Bryceson L Laning presented as referral from Dr. Norleen Seltzer [facility]) elevated PSA.  Biopsies     01/19/2024 Dr. Norleen Seltzer NM PET (PSMA) Skull to Mid Thigh CLINICAL DATA:  BLS INITIAL PET PSMA SCAN FOR PROSTATE CANCER +PROSTAE BX APPROX. 3 WEEKS AGO NO SX OR TX 6/12 CORE +, GLEASON SCORE OF 7 (4+3) PSA = 3.6 10/18 WOV  IMPRESSION: 1. No focal PSMA activity in the prostate gland 2. No PSMA-avid pelvic or abdominal lymph nodes. 3. No skeletal metastases.     Past/Anticipated interventions by urology, if any:  Dr. Norleen Seltzer    Past/Anticipated interventions by medical oncology, if any: NA  Weight changes, if any: No  IPSS:  0 SHIM: 1 refused  Bowel/Bladder complaints, if any:  No  Nausea/Vomiting, if any: No  Pain issues, if any:  0/10  reports chronic pain does take Lyrica  & Tylenol  pm.  SAFETY ISSUES: Prior radiation?  No Pacemaker/ICD? No Possible current pregnancy? Male Is the patient on methotrexate? No  Current Complaints / other details: None

## 2024-02-18 ENCOUNTER — Encounter: Payer: Self-pay | Admitting: Radiation Oncology

## 2024-02-18 DIAGNOSIS — K5900 Constipation, unspecified: Secondary | ICD-10-CM | POA: Insufficient documentation

## 2024-02-18 DIAGNOSIS — K5730 Diverticulosis of large intestine without perforation or abscess without bleeding: Secondary | ICD-10-CM | POA: Insufficient documentation

## 2024-02-18 DIAGNOSIS — R112 Nausea with vomiting, unspecified: Secondary | ICD-10-CM | POA: Insufficient documentation

## 2024-02-18 DIAGNOSIS — Z1211 Encounter for screening for malignant neoplasm of colon: Secondary | ICD-10-CM | POA: Insufficient documentation

## 2024-02-19 ENCOUNTER — Telehealth: Payer: Self-pay | Admitting: Radiation Oncology

## 2024-02-19 NOTE — Telephone Encounter (Signed)
 Left message with rescheduled upcoming appointment due to weather concerns.

## 2024-02-22 ENCOUNTER — Ambulatory Visit
Admission: RE | Admit: 2024-02-22 | Discharge: 2024-02-22 | Disposition: A | Discharge status: Home / Self Care | Source: Ambulatory Visit | Attending: Radiation Oncology | Admitting: Radiation Oncology

## 2024-02-22 ENCOUNTER — Ambulatory Visit: Admission: RE | Admit: 2024-02-22

## 2024-02-22 DIAGNOSIS — C61 Malignant neoplasm of prostate: Secondary | ICD-10-CM

## 2024-02-22 HISTORY — DX: Elevated prostate specific antigen (PSA): R97.20

## 2024-02-22 HISTORY — DX: Malignant neoplasm of prostate: C61

## 2024-03-01 ENCOUNTER — Ambulatory Visit
Admission: RE | Admit: 2024-03-01 | Discharge: 2024-03-01 | Disposition: A | Source: Ambulatory Visit | Attending: Radiation Oncology | Admitting: Radiation Oncology

## 2024-03-01 ENCOUNTER — Ambulatory Visit
Admission: RE | Admit: 2024-03-01 | Discharge: 2024-03-01 | Disposition: A | Source: Ambulatory Visit | Attending: Radiation Oncology

## 2024-03-01 ENCOUNTER — Encounter: Payer: Self-pay | Admitting: Radiation Oncology

## 2024-03-01 DIAGNOSIS — C61 Malignant neoplasm of prostate: Secondary | ICD-10-CM

## 2024-03-02 ENCOUNTER — Telehealth: Payer: Self-pay | Admitting: *Deleted

## 2024-03-02 NOTE — Telephone Encounter (Signed)
Called patient to ask questions, lvm for a return call

## 2024-03-04 ENCOUNTER — Telehealth: Payer: Self-pay | Admitting: *Deleted

## 2024-03-04 NOTE — Telephone Encounter (Signed)
CALLED PATIENT TO ASK QUESTIONS, LVM FOR A RETURN CALL
